# Patient Record
Sex: Male | Born: 1945 | Race: White | Hispanic: No | Marital: Married | Smoking: Current some day smoker
Health system: Southern US, Community
[De-identification: ages and names within clinical notes are randomized; demographics above are authoritative.]

## PROBLEM LIST (undated history)

## (undated) DIAGNOSIS — I10 Essential (primary) hypertension: Secondary | ICD-10-CM

## (undated) DIAGNOSIS — G4733 Obstructive sleep apnea (adult) (pediatric): Secondary | ICD-10-CM

## (undated) DIAGNOSIS — Z923 Personal history of irradiation: Secondary | ICD-10-CM

## (undated) DIAGNOSIS — F329 Major depressive disorder, single episode, unspecified: Secondary | ICD-10-CM

## (undated) DIAGNOSIS — Z9989 Dependence on other enabling machines and devices: Secondary | ICD-10-CM

## (undated) DIAGNOSIS — C801 Malignant (primary) neoplasm, unspecified: Secondary | ICD-10-CM

## (undated) DIAGNOSIS — Z8669 Personal history of other diseases of the nervous system and sense organs: Secondary | ICD-10-CM

## (undated) DIAGNOSIS — F32A Depression, unspecified: Secondary | ICD-10-CM

## (undated) DIAGNOSIS — E7800 Pure hypercholesterolemia, unspecified: Secondary | ICD-10-CM

## (undated) DIAGNOSIS — I82401 Acute embolism and thrombosis of unspecified deep veins of right lower extremity: Secondary | ICD-10-CM

## (undated) HISTORY — DX: Major depressive disorder, single episode, unspecified: F32.9

## (undated) HISTORY — DX: Depression, unspecified: F32.A

## (undated) HISTORY — DX: Malignant (primary) neoplasm, unspecified: C80.1

## (undated) HISTORY — DX: Essential (primary) hypertension: I10

## (undated) HISTORY — DX: Dependence on other enabling machines and devices: Z99.89

## (undated) HISTORY — DX: Personal history of other diseases of the nervous system and sense organs: Z86.69

## (undated) HISTORY — DX: Pure hypercholesterolemia, unspecified: E78.00

## (undated) HISTORY — DX: Acute embolism and thrombosis of unspecified deep veins of right lower extremity: I82.401

## (undated) HISTORY — DX: Obstructive sleep apnea (adult) (pediatric): G47.33

---

## 1998-09-07 ENCOUNTER — Ambulatory Visit (HOSPITAL_BASED_OUTPATIENT_CLINIC_OR_DEPARTMENT_OTHER): Admission: RE | Admit: 1998-09-07 | Discharge: 1998-09-07 | Payer: Self-pay | Admitting: Plastic Surgery

## 2000-03-01 ENCOUNTER — Other Ambulatory Visit: Admission: RE | Admit: 2000-03-01 | Discharge: 2000-03-01 | Payer: Self-pay | Admitting: Otolaryngology

## 2000-03-01 ENCOUNTER — Encounter (INDEPENDENT_AMBULATORY_CARE_PROVIDER_SITE_OTHER): Payer: Self-pay | Admitting: Specialist

## 2001-11-22 ENCOUNTER — Encounter: Admission: RE | Admit: 2001-11-22 | Discharge: 2001-11-22 | Payer: Self-pay | Admitting: Urology

## 2001-11-22 ENCOUNTER — Encounter: Payer: Self-pay | Admitting: Urology

## 2001-11-26 ENCOUNTER — Ambulatory Visit (HOSPITAL_BASED_OUTPATIENT_CLINIC_OR_DEPARTMENT_OTHER): Admission: RE | Admit: 2001-11-26 | Discharge: 2001-11-26 | Payer: Self-pay | Admitting: Urology

## 2001-11-29 ENCOUNTER — Encounter: Payer: Self-pay | Admitting: Urology

## 2001-11-29 ENCOUNTER — Ambulatory Visit (HOSPITAL_BASED_OUTPATIENT_CLINIC_OR_DEPARTMENT_OTHER): Admission: RE | Admit: 2001-11-29 | Discharge: 2001-11-29 | Payer: Self-pay | Admitting: Urology

## 2002-08-07 ENCOUNTER — Ambulatory Visit (HOSPITAL_BASED_OUTPATIENT_CLINIC_OR_DEPARTMENT_OTHER): Admission: RE | Admit: 2002-08-07 | Discharge: 2002-08-07 | Payer: Self-pay | Admitting: Plastic Surgery

## 2002-08-07 ENCOUNTER — Encounter (INDEPENDENT_AMBULATORY_CARE_PROVIDER_SITE_OTHER): Payer: Self-pay | Admitting: Specialist

## 2004-03-08 ENCOUNTER — Encounter (INDEPENDENT_AMBULATORY_CARE_PROVIDER_SITE_OTHER): Payer: Self-pay | Admitting: Plastic Surgery

## 2004-03-08 ENCOUNTER — Ambulatory Visit (HOSPITAL_BASED_OUTPATIENT_CLINIC_OR_DEPARTMENT_OTHER): Admission: RE | Admit: 2004-03-08 | Discharge: 2004-03-08 | Payer: Self-pay | Admitting: Plastic Surgery

## 2004-03-08 ENCOUNTER — Ambulatory Visit (HOSPITAL_COMMUNITY): Admission: RE | Admit: 2004-03-08 | Discharge: 2004-03-08 | Payer: Self-pay | Admitting: Plastic Surgery

## 2005-02-28 ENCOUNTER — Ambulatory Visit (HOSPITAL_COMMUNITY): Admission: RE | Admit: 2005-02-28 | Discharge: 2005-02-28 | Payer: Self-pay | Admitting: Gastroenterology

## 2007-05-17 HISTORY — PX: KIDNEY SURGERY: SHX687

## 2007-11-07 ENCOUNTER — Inpatient Hospital Stay (HOSPITAL_COMMUNITY): Admission: RE | Admit: 2007-11-07 | Discharge: 2007-11-09 | Payer: Self-pay | Admitting: Urology

## 2007-11-07 ENCOUNTER — Encounter: Payer: Self-pay | Admitting: Urology

## 2008-02-25 ENCOUNTER — Ambulatory Visit (HOSPITAL_COMMUNITY): Admission: RE | Admit: 2008-02-25 | Discharge: 2008-02-25 | Payer: Self-pay | Admitting: Urology

## 2008-05-29 ENCOUNTER — Ambulatory Visit (HOSPITAL_COMMUNITY): Admission: RE | Admit: 2008-05-29 | Discharge: 2008-05-29 | Payer: Self-pay | Admitting: Urology

## 2008-06-12 ENCOUNTER — Ambulatory Visit: Payer: Self-pay | Admitting: Hematology and Oncology

## 2008-09-02 ENCOUNTER — Ambulatory Visit (HOSPITAL_COMMUNITY): Admission: RE | Admit: 2008-09-02 | Discharge: 2008-09-02 | Payer: Self-pay | Admitting: Urology

## 2008-12-16 ENCOUNTER — Ambulatory Visit (HOSPITAL_COMMUNITY): Admission: RE | Admit: 2008-12-16 | Discharge: 2008-12-16 | Payer: Self-pay | Admitting: Urology

## 2009-04-17 ENCOUNTER — Ambulatory Visit (HOSPITAL_BASED_OUTPATIENT_CLINIC_OR_DEPARTMENT_OTHER): Admission: RE | Admit: 2009-04-17 | Discharge: 2009-04-18 | Payer: Self-pay | Admitting: Urology

## 2009-04-28 ENCOUNTER — Ambulatory Visit (HOSPITAL_COMMUNITY): Admission: RE | Admit: 2009-04-28 | Discharge: 2009-04-28 | Payer: Self-pay | Admitting: Urology

## 2009-05-16 HISTORY — PX: PENILE PROSTHESIS IMPLANT: SHX240

## 2009-09-17 ENCOUNTER — Ambulatory Visit (HOSPITAL_COMMUNITY): Admission: RE | Admit: 2009-09-17 | Discharge: 2009-09-17 | Payer: Self-pay | Admitting: Urology

## 2010-03-22 ENCOUNTER — Ambulatory Visit (HOSPITAL_COMMUNITY): Admission: RE | Admit: 2010-03-22 | Discharge: 2010-03-22 | Payer: Self-pay | Admitting: Urology

## 2010-08-17 LAB — POCT I-STAT 4, (NA,K, GLUC, HGB,HCT)
HCT: 40 % (ref 39.0–52.0)
Hemoglobin: 13.6 g/dL (ref 13.0–17.0)
Potassium: 4.5 mEq/L (ref 3.5–5.1)
Sodium: 141 mEq/L (ref 135–145)

## 2010-09-20 ENCOUNTER — Other Ambulatory Visit: Payer: Self-pay | Admitting: Urology

## 2010-09-20 ENCOUNTER — Ambulatory Visit (HOSPITAL_COMMUNITY)
Admission: RE | Admit: 2010-09-20 | Discharge: 2010-09-20 | Disposition: A | Discharge status: Home / Self Care | Payer: BC Managed Care – PPO | Source: Ambulatory Visit | Attending: Urology | Admitting: Urology

## 2010-09-20 DIAGNOSIS — C649 Malignant neoplasm of unspecified kidney, except renal pelvis: Secondary | ICD-10-CM | POA: Insufficient documentation

## 2010-09-20 DIAGNOSIS — J8410 Pulmonary fibrosis, unspecified: Secondary | ICD-10-CM | POA: Insufficient documentation

## 2010-09-20 DIAGNOSIS — F17200 Nicotine dependence, unspecified, uncomplicated: Secondary | ICD-10-CM | POA: Insufficient documentation

## 2010-09-28 NOTE — Op Note (Signed)
NAMEJESHAWN, Edward [name]           ACCOUNT NO.:  [id number]   MEDICAL RECORD NO.:  [id number]          PATIENT TYPE:  INP   LOCATION:  0008                         FACILITY:  [facility]   PHYSICIAN:  Bertram Millard. Dahlstedt, M.D.DATE OF BIRTH:  06-Jan-1946   DATE OF PROCEDURE:  11/07/2007  DATE OF DISCHARGE:                               OPERATIVE REPORT   PREOPERATIVE DIAGNOSIS:  Left renal cortical mass.   POSTOPERATIVE DIAGNOSIS:  Left renal cortical mass.   PROCEDURE PERFORMED:  Left laparoscopic radical nephrectomy.   SURGEON:  Bertram Millard. Dahlstedt, MD   ASSISTANT:  Melina Schools, MD   ANESTHESIA:  General.   ESTIMATED BLOOD LOSS:  75 mL.   DRAINS:  16-French Foley catheter.   INDICATIONS FOR PROCEDURE:  Mr. Dewan is a 65 year old male with  known nephrolithiasis.  He developed recurrent flank pain and hematuria.  He was imaged and found to have a centrally-located renal mass.  He  presents for surgical extirpation.   DESCRIPTION OF PROCEDURE:  The patient brought to the operating room.  He was identified by his armband.  Informed consent was verified and a  preoperative time-out was performed.  After the successful induction of  general endotracheal anesthesia the patient was moved to the right side  down flank position.  The table was flexed.  A Foley catheter was  inserted transurethrally into the bladder and the balloon inflated with  10 mL sterile water and placed to straight drain.  Operative site was  prepped and draped.  Surgeons were gowned and gloved.  Perioperative  antibiotics were administered and sequential compression devices were  employed.   An infraumbilical incision was made in the midline.  Bovie cautery was  used to carry this down to the aponeurosis of the external oblique,  which was incised sharply with Bovie cautery.  We identified the  posterior sheath along the arcuate line, which was also incised sharply.  We placed stay stitches in the  fascia.  We then punctured the peritoneal  layer and gained access to the peritoneal cavity.  We with digital  palpation confirmed that there were no adhesions.  We then placed a  Hasson port and secured it with our stay sutures.  We then performed  laparoscopy after insufflating the abdomen with carbon dioxide.   Assisting ports were placed under direct vision with the laparoscope  intraperitoneally.  We placed a 12-mm port in the right lower quadrant  and a 5-mm port in the midline superior to the umbilicus.  We then  identified the colon.  Using the Harmonic scalpel, we incised along the  white line of Toldt and reflected the colon medially.  This was done  from the splenic flexure down to the level of the iliac bifurcation.  We  then identified the plane between the mesocolon and the retroperitoneum  and developed this with a combination of blunt and sharp dissection.  This allowed [place] free exposure of the left retroperitoneum.  We  identified the left gonadal vein as well as the left ureter.  We were  able to bluntly dissect beneath that, identified the psoas muscle.  The  kidney was then tented upwards and we marched in a caudal to superior  fashion along this toward the hilum.  We further dropped the colon off  the kidney once it was tented up and this allowed [place] to expose the left  renal vein.  It was dissected and skeletonized.  We did identify a  rather large adrenal branch.  We then divided the gonadal vein after  securing it with clips to better our exposure.  We then continued the  posterior dissection until we identified the left renal artery.  This  again was skeletonized with a right angle.  It was clipped with two  clips on the downside and one clip on the specimen side.  We then left  it in situ without dividing it.  We did note that the vein went  relatively flat.  We then clipped the renal vein in similar fashion with  two clips on the down side, one on the specimen  side.  We additionally  doubly clipped the adrenal vein.  We then divided the left renal vein.  We then divided the left renal artery.  We then carried our dissection  along the medial border of the kidney superiorly until we had exposed  the adrenal.  At this time we completed the posterior and inferior  dissection.  We divided the ureter.  We used the LigaSure device to  divide the fatty attachments between Gerota's and the abdominal wall.  This completely dissected out the posterior lateral aspects of the  kidney as well as the inferior pole.  This left only the superior  portions.  We entered Gerota fascia superior to the renal hilum and  dropped the adrenal off so as to spare it.  We used a LigaSure device to  a drop that leaflet of Gerota fascia off of the superior pole of the  kidney.  Then divided the lienorenal ligament.  All the remaining  attachments were taken down bluntly.  At this time the kidney was freely  mobile.  We translocated away from the renal fossa and inspected.  The  excellent hemostasis was noted at the renal hilum.  There was a small  amount of bleeding coming from the splenic capsule and a piece of  Surgicel was placed.  We did not note any brisk bleeding with the  pneumoperitoneum there.  We then attempted several times to place the  very large specimen with a large amount of perinephric fat in an  EndoCatch bag.  We were unable to do so due to the size of the specimen.  We then looked all of our ports under direct vision.  We removed the  Hasson port and released the pneumoperitoneum.  We extended the midline  fasciotomy to a length of approximately 8 cm.  We were then able to  reach inside the peritoneal cavity and remove the kidney and perinephric  fat en bloc.  This was sent for permanent pathology.  The wounds were  irrigated.  The fascia of the 12-mm port was closed with a single 0  Vicryl interrupted.  The extraction site was closed with a running 0   Vicryl in standard fashion.  The skin was closed with surgical staples.  The patient tolerated the procedure well and there were no  complications.  Marcine Matar, MD, was the attending primary  responsible physician and was present and participated in all aspects of  the procedure.   DISPOSITION:  To the Post Anesthesia  Care Unit.      Melina Schools, MD      Bertram Millard. Dahlstedt, M.D.  Electronically Signed    JR/MEDQ  D:  11/07/2007  T:  11/07/2007  Job:  [id number]

## 2010-09-28 NOTE — H&P (Signed)
NAMEFRED, Edward [name]           ACCOUNT NO.:  [id number]   MEDICAL RECORD NO.:  [id number]          PATIENT TYPE:  INP   LOCATION:  0008                         FACILITY:  [facility]   PHYSICIAN:  Bertram Millard. Dahlstedt, M.D.DATE OF BIRTH:  04-Feb-1946   DATE OF ADMISSION:  11/07/2007  DATE OF DISCHARGE:                              HISTORY & PHYSICAL   REASON FOR ADMISSION:  Left renal mass.   BRIEF HISTORY:  [name] is a 65 year old male who presents at this time  for laparoscopic assisted left radical nephrectomy.  This was recently  diagnosed when he presented with flank pain and gross hematuria.  CT  abdomen and pelvis was performed at [facility] revealing a 6-  7 cm inferior pole mass in the left kidney.  Metastatic survey was  negative.  He has not had recurrent flank pain or gross hematuria.  He  presents for surgery, having been instructed in risks and complications  of the procedure.  He understands and desires to proceed.   PAST MEDICAL HISTORY:  Significant for  1. Sleep apnea.  2. Anxiety.  3. Depression.  4. Gout.  5. Hypercholesterolemia.  6. Hypertension.  7. Spinal stenosis.  8. Urolithiasis.   MEDICATIONS:  1. Allopurinol 100 mg daily.  2. Amlodipine 10 mg daily.  3. Benicar 40 mg daily.  4. Wellbutrin 300 mg daily.  5. Cialis 20 mg p.r.n.  6. Citalopram 40 mg daily.  7. Lasix 80 mg daily.  8. Klonopin.  9. Klor-Con 20 mEq daily.  10.Meloxicam 15 mg p.o.  11.Ambien 10 mg p.r.n.  12.Prevacid 30 mg daily.   ALLERGIES:  He denies drug allergies.   SOCIAL HISTORY:  The patient is married.  He has a grown son and  daughter and a 19 year old daughter.  He manages his family's finances  at home.  He does not have an active job away from home.  He does still  smoke.  He does not drink, having given this up secondary to alcoholism.   REVIEW OF SYSTEMS:  Significant for erectile dysfunction.  He has  depression.  Occasional back pain and leg  swelling.   PHYSICAL EXAMINATION:  GENERAL APPEARANCE:  A pleasant, middle-aged  male.  He had a somewhat flat affect.  NECK:  He had no subclavicular or cervical adenopathy.  LUNGS:  Clear.  HEART:  Normal rate and rhythm.  ABDOMEN:  Mildly protuberant, soft, nondistended, nontender.  No mass,  no megaly.  Bladder nonpalpable.  No CVA tenderness, no flank mass.  PROSTATE GLAND:  Unremarkable.  RECTAL:  No masses.  GU:  External genitalia were normal.  EXTREMITIES:  He had 1+ pretibial edema bilaterally.  DP and PTT pulses  were normal bilaterally.   ADMISSION LABORATORY DATA:  BMET was normal.  Hematocrit 37%, white  count 10,000, platelet count 378,000.   EKG:  Normal sinus rhythm.  He has been cleared by Dr. Kristeen Miss for  surgery.   Chest CT revealed findings consistent with granulomas, otherwise  unremarkable.   IMPRESSION:  1. Left renal mass.  This is most likely a renal cell carcinoma.  The  patient presents for nephrectomy today.  2. Depression.  3. Hypertension, controlled.  4. Hypercholesterolemia.   PLAN:  Admit for laparoscopic assisted left radical nephrectomy.      Bertram Millard. Dahlstedt, M.D.  Electronically Signed     SMD/MEDQ  D:  11/07/2007  T:  11/07/2007  Job:  [id number]   cc:   Vesta Mixer, M.D.  Fax: [phone]   C. Duane Lope, M.D.  Fax: [phone]

## 2010-10-01 NOTE — Op Note (Signed)
NAMEKIP, CROPP           ACCOUNT NO.:  [id number]   MEDICAL RECORD NO.:  [id number]          PATIENT TYPE:  AMB   LOCATION:  DSC                          FACILITY:  [facility]   PHYSICIAN:  Etter Sjogren, M.D.     DATE OF BIRTH:  [birth year]   DATE OF PROCEDURE:  03/08/2004  DATE OF DISCHARGE:                                 OPERATIVE REPORT   PREOPERATIVE DIAGNOSES:  1.  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  2.  Lesion of undetermined behavior, left cheek, greater than 0.5 cm.   POSTOPERATIVE DIAGNOSES:  1.  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  2.  Lesion of undetermined behavior, left cheek, greater than 0.5 cm.  3.  Complicated open wounds of the right and left cheek, total length of      greater than 2.5 cm.   PROCEDURE PERFORMED:  1.  Excision of basal cell carcinoma with resection greater than 1.0 cm.  2.  Excision, lesion of left cheek of undetermined behavior, greater than      0.5 cm.  3.  Complex wound closures, left and right cheek, total length greater than      2.5 cm.   SURGEON:  Etter Sjogren, M.D.   ANESTHESIA:  1% Xylocaine with epinephrine plus bicarb.   CLINICAL NOTE:  A 65 year old man has a basal cell carcinoma of his right  cheek, biopsy proven by his dermatologist. In addition, he has a lesion that  is bleeding on this left cheek. He would like to have that excised as well.  Procedure and risks were understood by him, including possibly a further  surgery depending upon the final pathology report. He wished to proceed.   PROCEDURE:  The patient was placed in a slightly head-elevated position. He  was marked for the resections with generous margins around the basal cell  carcinoma of the right cheek. He was prepped with Betadine and draped with  sterile drapes.  Satisfactory local anesthesia achieved, 1% Xylocaine  with  epinephrine plus bicarb. Left cheek lesion was addressed first with an  excision and thorough irrigation and layered closure with 4-0 Monocryl  interrupted deep sutures, 4-0 Monocryl interrupted deep dermal suture, and 6-  0 Prolene simple running suture.   Attention then directed to the right cheek where again satisfactory local  anesthesia achieved and the excision performed. Thorough irrigation and the  layered closure again with 4-0 Monocryl interrupted very deep sutures, 4-0  Monocryl interrupted very deep dermal sutures, and a running 6-0 Prolene  simple suture. Antibiotic ointment applied to both sites. Dry sterile  dressing. Tolerated well.   DISPOSITION:  Recheck in the office in a week.       DB/MEDQ  D:  03/08/2004  T:  03/08/2004  Job:  [id number]   cc:   Rocco Serene, M.D.  [address] [place]  [place] [address]  Fax: [phone]

## 2010-10-01 NOTE — Op Note (Signed)
   NAME:  Edward [name], Edward [name]                     ACCOUNT NO.:  [id number]   MEDICAL RECORD NO.:  [id number]                   PATIENT TYPE:  AMB   LOCATION:  DSC                                  FACILITY:  [facility]   PHYSICIAN:  Etter Sjogren, M.D.                  DATE OF BIRTH:  31-Oct-1945   DATE OF PROCEDURE:  08/07/2002  DATE OF DISCHARGE:                                 OPERATIVE REPORT   PREOPERATIVE DIAGNOSIS:  Basal cell carcinoma 1 cm cheek with irregular  borders.   POSTOPERATIVE DIAGNOSIS:  1. Basal cell carcinoma 1 cm cheek with irregular borders.  2. Complicated open wound of the cheek greater than 2.5 cm secondary to a     cancer excision with irregular borders.   OPERATION PERFORMED:  1. Basal cell carcinoma excision 1.0 cm, cheek.  2. Complex wound closure of the cheek, greater than 2.5 cm.   SURGEON:  Etter Sjogren, M.D.   ANESTHESIA:  1% Xylocaine with epinephrine plus bicarb.   INDICATIONS FOR PROCEDURE:  The patient is a 65 year old man who has a  history of previous skin cancers.  He has another one now, basal cell  carcinoma of the left cheek that has been biopsy proven by his  dermatologist.  He presents for a wider excision of this.  The nature of the  procedure, the risks and possible complications were discussed with him in  detail.  He understood these risks including the possibility of further  surgery depending on the final pathology report and wished to proceed.   DESCRIPTION OF PROCEDURE:  The elliptical excision was marked taking a  border of what appeared to be grossly normal tissue.  He was then placed  supine, prepped with Betadine and draped with sterile drapes.  Satisfactory  local anesthesia was achieved and the excision was performed.  The specimen  tagged at its medial border with a silk stitch.  The wound was irrigated  thoroughly.  Good hemostasis was confirmed.  The wound edges were  undermined.  A thorough irrigation again.  A layered  closure with 5-0  Monocryl inverted interrupted deep sutures, 5-0 Monocryl interrupted  inverted subcutaneous and deep dermal sutures and a running 6-0 Prolene  suture, a few 6-0 Prolene simple sutures and one 6-0 Prolene horizontal  mattress suture for wound edge eversion.  Antibiotic ointment was applied.  He tolerated the procedure well.   DISPOSITION:  Recheck in six days in the office.  No lifting or vigorous  activities for two days.                                               Etter Sjogren, M.D.     DB/MEDQ  D:  08/07/2002  T:  08/08/2002  Job:  [id number]

## 2010-10-01 NOTE — Discharge Summary (Signed)
Edward [name], Edward [name]           ACCOUNT NO.:  [id number]   MEDICAL RECORD NO.:  [id number]          PATIENT TYPE:  INP   LOCATION:  1414                         FACILITY:  [facility]   PHYSICIAN:  Bertram Millard. Dahlstedt, M.D.DATE OF BIRTH:  1946-01-12   DATE OF ADMISSION:  11/07/2007  DATE OF DISCHARGE:  11/09/2007                               DISCHARGE SUMMARY   ADMITTING DIAGNOSIS:  Left renal mass.   DISCHARGE DIAGNOSIS:  Left renal mass.   PROCEDURE PERFORMED:  Left laparoscopic radical nephrectomy.   HISTORY OF PRESENT ILLNESS:  Edward [name] is a 65 year old male with a  history of nephrolithiasis.  He presented with flank pain and hematuria.  He underwent a CT scan which showed a 7 cm inferior pole mass in the  left kidney.  His metastatic survey was negative.  He presents for  surgical extrication.   HOSPITAL COURSE:  The patient was admitted to the hospital on the day of  surgery.  He was taken to the operating room where he underwent the  above-mentioned procedure under general anesthetic.  The patient  tolerated the procedure well and there were no complications.  He was  admitted to the floor from the post anesthesia care unit.  He given a  clear liquid diet on postoperative day 1, and his Foley catheter was  removed.  On postoperative day 2, the patient was ambulatory, tolerating  a diet.  His pain was controlled with p.o. pain medicine.  His port  sites were clean, dry, intact, and he was voiding without the use of  Foley catheter.  At this time it was felt the patient was stable for  discharge to home.   DISCHARGE MEDICATIONS:  1. Resume home medications.  2. Vicodin.   DISCHARGE INSTRUCTIONS:  The patient was told to resume his previous  activity.  He is up as tolerated.  He is to resume his previous diet.  He is instructed to call [phone] for any concerning symptoms including,  but not limited to the following:  fever, chills, controlled pain, wound  redness  or drainage, or any concerns.  The patient indicated he  understood those instructions and was amenable to discharge to home.   DISPOSITION:  To home.     Melina Schools, MD      Bertram Millard. Dahlstedt, M.D.  Electronically Signed   JR/MEDQ  D:  11/13/2007  T:  11/13/2007  Job:  [id number]

## 2010-10-01 NOTE — Op Note (Signed)
[facility]  Patient:    Edward [name], BEEHLER Visit Number: [id number] MRN: [id number]          Service Type: NES Location: [facility]ESC Attending Physician:  Liborio Nixon Dictated by:   Bertram Millard. Dahlstedt, M.D. Proc. Date: 11/26/01 Admit Date:  11/26/2001   CC:         Vikki Ports, M.D.   Operative Report  PREOPERATIVE DIAGNOSIS:  Right renal calculus.  POSTOPERATIVE DIAGNOSIS:  Right renal calculus.  PRINCIPAL PROCEDURE:  Cystoscopy, right retrograde ureteral pyelogram, double-J stent placement.  SURGEON:  Bertram Millard. Dahlstedt, M.D.  ANESTHESIA:  General with LMA.  COMPLICATIONS:  None.  BRIEF HISTORY:  A 65 year old gentleman with a longstanding right flank pain. I had seen him initially back in 2000 for an elevated PSA.  Over the past six months or so he has had intermittent right flank pain and intermittent gross hematuria.  He was diagnosed with a 12 x 12 mm right renal calculus.  This has continued to cause the patient symptoms.  At the present time the patient presents for cystoscopy, retrograde pyelogram and double-J stent placement.  This will be followed in three days by a lithotripsy.  Other options of stone management have been discussed with the patient, including in situ lithotripsy versus ureteroscopy.  He has chosen to have the less invasive treatment.  He is aware of the risks and complications and desires to proceed.  DESCRIPTION OF PROCEDURE:  The patient was administered a general anesthetic and placed in the dorsal lithotomy position.  Genitalia and perineum were prepped and draped.  A 22-French panendoscope was advanced into his bladder; which was unremarkable.  The right ureter was cannulated and a retrograde was performed, showing only minor filling defect in the pelvis of the right kidney.  No ureteral obstruction or filling defects were noted.   A 6-French x 26 cm double-J stent was placed over a  guide wire.  We were using fluoroscopic and cystoscopic guidance.  The patient tolerated the procedure well.  His bladder was drained and the scope removed.  A B&O suppository was placed transrectally.  The patient will follow up in three days with lithotripsy of the right renal calculus.   Dictated by:   Bertram Millard. Dahlstedt, M.D. Attending Physician:  Liborio Nixon DD:  11/26/01 TD:  11/27/01 Job: [id number]

## 2010-10-01 NOTE — Op Note (Signed)
Edward [name], Edward [name]           ACCOUNT NO.:  [id number]   MEDICAL RECORD NO.:  [id number]          PATIENT TYPE:  AMB   LOCATION:  ENDO                         [facility]ACILITY[facility]   PHYSICIAN:  Shirley Friar, MDDATE OF BIRTH:  06-26-1945   DATE OF PROCEDURE:  02/28/2005  DATE OF DISCHARGE:                                 OPERATIVE REPORT   PROCEDURE:  Colonoscopy.   INDICATIONS FOR PROCEDURE:  Screening.   ANESTHESIA:  Demerol 60 mg IV, Versed 6 mg IV.   FINDINGS:  Rectal exam was normal.  A standard adult colonoscope was  inserted and advanced through a well-prepped colon to the cecum where the  ileocecal valve and appendiceal orifice were identified.  The terminal ileum  was intubated and was normal in appearance.  Careful withdrawal of the  colonoscope revealed normal mucosa.  No masses and no polyps were noted.  Retroflexion revealed small internal hemorrhoids.  The colonoscope was  withdrawn, comparing the above findings.   ASSESSMENT:  Small internal hemorrhoids.  Otherwise normal colonoscopy.   PLAN:  1.  Repeat colonoscopy in 10 years for screening.  2.  Increase fiber in diet.  3.  Follow up in GI as needed.      Shirley Friar, MD  Electronically Signed     VCS/MEDQ  D:  02/28/2005  T:  02/28/2005  Job:  [id number]   cc:   Dellis Anes. Idell Pickles, M.D.  Fax: [phone]

## 2011-02-10 LAB — DIFFERENTIAL
Basophils Absolute: 0
Basophils Relative: 0
Eosinophils Absolute: 0
Eosinophils Relative: 0
Monocytes Absolute: 1.6 — ABNORMAL HIGH
Monocytes Relative: 11

## 2011-02-10 LAB — BASIC METABOLIC PANEL
BUN: 13
CO2: 27
Calcium: 8.4
Chloride: 102
Chloride: 106
Creatinine, Ser: 1.34
GFR calc Af Amer: 48 — ABNORMAL LOW
GFR calc non Af Amer: 55 — ABNORMAL LOW
Glucose, Bld: 142 — ABNORMAL HIGH
Glucose, Bld: 98
Sodium: 137

## 2011-02-10 LAB — CBC
HCT: 33.2 — ABNORMAL LOW
HCT: 37.4 — ABNORMAL LOW
Hemoglobin: 11 — ABNORMAL LOW
MCHC: 33
MCV: 82.4
MCV: 82.7
Platelets: 378
RBC: 4.02 — ABNORMAL LOW
RDW: 18.2 — ABNORMAL HIGH
RDW: 18.3 — ABNORMAL HIGH
WBC: 10.5

## 2011-02-10 LAB — ABO/RH: ABO/RH(D): A POS

## 2011-02-10 LAB — TYPE AND SCREEN: ABO/RH(D): A POS

## 2011-09-23 ENCOUNTER — Other Ambulatory Visit: Payer: Self-pay | Admitting: Urology

## 2011-09-23 ENCOUNTER — Ambulatory Visit (HOSPITAL_COMMUNITY)
Admission: RE | Admit: 2011-09-23 | Discharge: 2011-09-23 | Disposition: A | Discharge status: Home / Self Care | Payer: BC Managed Care – PPO | Source: Ambulatory Visit | Attending: Urology | Admitting: Urology

## 2011-09-23 DIAGNOSIS — Z125 Encounter for screening for malignant neoplasm of prostate: Secondary | ICD-10-CM

## 2011-09-23 DIAGNOSIS — K449 Diaphragmatic hernia without obstruction or gangrene: Secondary | ICD-10-CM | POA: Insufficient documentation

## 2011-09-23 DIAGNOSIS — Z8546 Personal history of malignant neoplasm of prostate: Secondary | ICD-10-CM | POA: Insufficient documentation

## 2012-10-12 ENCOUNTER — Other Ambulatory Visit: Payer: Self-pay | Admitting: Urology

## 2012-10-12 ENCOUNTER — Ambulatory Visit (HOSPITAL_COMMUNITY)
Admission: RE | Admit: 2012-10-12 | Discharge: 2012-10-12 | Disposition: A | Discharge status: Home / Self Care | Payer: BC Managed Care – PPO | Source: Ambulatory Visit | Attending: Urology | Admitting: Urology

## 2012-10-12 DIAGNOSIS — I10 Essential (primary) hypertension: Secondary | ICD-10-CM | POA: Insufficient documentation

## 2012-10-12 DIAGNOSIS — C649 Malignant neoplasm of unspecified kidney, except renal pelvis: Secondary | ICD-10-CM

## 2012-10-12 DIAGNOSIS — F17200 Nicotine dependence, unspecified, uncomplicated: Secondary | ICD-10-CM | POA: Insufficient documentation

## 2013-01-10 ENCOUNTER — Encounter: Payer: Self-pay | Admitting: Neurology

## 2013-01-10 ENCOUNTER — Ambulatory Visit (INDEPENDENT_AMBULATORY_CARE_PROVIDER_SITE_OTHER): Payer: BC Managed Care – PPO | Admitting: Neurology

## 2013-01-10 VITALS — BP 131/78 | HR 62 | Temp 98.2°F | Ht 72.5 in | Wt 221.0 lb

## 2013-01-10 DIAGNOSIS — F17200 Nicotine dependence, unspecified, uncomplicated: Secondary | ICD-10-CM

## 2013-01-10 DIAGNOSIS — R400 Somnolence: Secondary | ICD-10-CM

## 2013-01-10 DIAGNOSIS — H532 Diplopia: Secondary | ICD-10-CM

## 2013-01-10 DIAGNOSIS — G4710 Hypersomnia, unspecified: Secondary | ICD-10-CM

## 2013-01-10 DIAGNOSIS — Z8669 Personal history of other diseases of the nervous system and sense organs: Secondary | ICD-10-CM

## 2013-01-10 DIAGNOSIS — R9402 Abnormal brain scan: Secondary | ICD-10-CM

## 2013-01-10 DIAGNOSIS — R9409 Abnormal results of other function studies of central nervous system: Secondary | ICD-10-CM

## 2013-01-10 DIAGNOSIS — G4733 Obstructive sleep apnea (adult) (pediatric): Secondary | ICD-10-CM

## 2013-01-10 DIAGNOSIS — G5139 Clonic hemifacial spasm, unspecified: Secondary | ICD-10-CM

## 2013-01-10 DIAGNOSIS — R51 Headache: Secondary | ICD-10-CM

## 2013-01-10 DIAGNOSIS — G518 Other disorders of facial nerve: Secondary | ICD-10-CM

## 2013-01-10 HISTORY — DX: Personal history of other diseases of the nervous system and sense organs: Z86.69

## 2013-01-10 NOTE — Progress Notes (Signed)
Subjective:    Patient ID: Edward [name] is a 67 y.o. male.  HPI Edward Foley, MD, PhD [facility] [address], [address] [address] [place], [place] [address]   Dear Dr. Tenny Craw,   I saw your patient, Edward [name], upon your kind request in my neurologic clinic today for initial consultation of his diplopia. The patient is unaccompanied today. As you know, Edward [name] is a 67 year old right-handed gentleman with an underlying medical history of renal cancer, status post nephrectomy, OSA, not on CPAP due to "inconvenience", chronic kidney disease stage III, vitamin D deficiency, cervical spinal stenosis, hyperlipidemia, depression, insomnia, reflux disease, gout, history of Bell's palsy, erectile dysfunction, hypertension and smoking who has had 2 recent short episodes of sudden onset of double vision. This occurred about a week ago while driving and he seemed to have 2 superimposed images vertically. The second episode was about 2 days later. Those episodes lasted less than a minute and he did not have any one-sided weakness, numbness, droopy face, slurring of speech or confusion or decrease in consciousness level. He does have a history of headaches and reports more headaches recently. He endorses some daytime tiredness. He was started by you on Plavix on 01/08/2013 and has also been referred to ophthalmology, and saw the eye doctor yesterday and was told everything was.  He had no HA at the time of his diplopia, no nausea, no SOB/CP. He has residual R facial weakness from Bell's palsy in '98, but otherwise had noted no weakness, no numbness, no tingling. He feels at baseline currently but does endorse recent increase in stressors and in anxiety and has been smoking more cigarettes. He has a remote history of alcoholism and has not had any alcohol in the past 24 years or so. The headaches he is describing are intermittent, non-throbbing, achy, constant, bifrontal, not  associated with nausea, vomiting, photophobia or sonophobia. Tylenol helps. Occasionally he wakes up with a headache. He endorses snoring and daytime tiredness. He endorses lack of energy.  His Past Medical History Is Significant For: Past Medical History  Diagnosis Date  . HTN (hypertension)   . Hypercholesteremia   . Depression   . Cancer     Renal   His Past Surgical History Is Significant For: Past Surgical History  Procedure Laterality Date  . Kidney surgery  2009  . Penile prosthesis implant  2011   His Family History Is Significant For: Family History  Problem Relation Age of Onset  . Cancer Mother   . Heart failure Father   Heart attack.  His Social History Is Significant For: History   Social History  . Marital Status: Married    Spouse Name: N/A    Number of Children: N/A  . Years of Education: N/A   Social History Main Topics  . Smoking status: Current Every Day Smoker -- 1.00 packs/day    Types: Cigarettes  . Smokeless tobacco: None  . Alcohol Use: No  . Drug Use: No  . Sexual Activity: None   Other Topics Concern  . None   Social History Narrative  . None    His Allergies Are:  Allergies  Allergen Reactions  . Nsaids   :   His Current Medications Are:  Outpatient Encounter Prescriptions as of 01/10/2013  Medication Sig Dispense Refill  . allopurinol (ZYLOPRIM) 100 MG tablet       . buPROPion (WELLBUTRIN XL) 150 MG 24 hr tablet       .  citalopram (CELEXA) 40 MG tablet       . clonazePAM (KLONOPIN) 0.5 MG tablet       . clopidogrel (PLAVIX) 75 MG tablet       . furosemide (LASIX) 80 MG tablet       . KLOR-CON M20 20 MEQ tablet       . LIPITOR 40 MG tablet       . metoprolol succinate (TOPROL-XL) 100 MG 24 hr tablet       . omeprazole (PRILOSEC) 40 MG capsule       . ramipril (ALTACE) 5 MG capsule       . zolpidem (AMBIEN) 10 MG tablet        No facility-administered encounter medications on file as of 01/10/2013.   Review of Systems   Constitutional: Positive for fatigue.  Eyes: Positive for visual disturbance (double vision).    Objective:  Neurologic Exam  Physical Exam Physical Examination:   Filed Vitals:   01/10/13 0824  BP: 131/78  Pulse: 62  Temp: 98.2 F (36.8 C)    General Examination: The patient is a very pleasant 67 y.o. male in no acute distress. He appears well-developed and well-nourished and well groomed.   HEENT: Normocephalic, atraumatic, pupils are equal, round and reactive to light and accommodation. Funduscopic exam is normal with sharp disc margins noted. Extraocular tracking is good without limitation to gaze excursion or nystagmus noted. Normal smooth pursuit is noted. Hearing is grossly intact. Tympanic membranes are clear bilaterally. Face is asymmetric with normal facial animation with R LMN type weakness and L hemifacial spasm, and normal facial sensation. Speech is clear with no dysarthria noted. There is no hypophonia. There is no lip, neck/head, jaw or voice tremor. Neck is supple with full range of passive and active motion. There are no carotid bruits on auscultation. Oropharynx exam reveals: mild mouth dryness, adequate dental hygiene and mild airway crowding, due to redundant soft palate. Uvula is absent, he is status post uvulectomy. Tonsils are 1+.  Chest: Clear to auscultation without wheezing, rhonchi or crackles noted.  Heart: S1+S2+0, regular and normal without murmurs, rubs or gallops noted.   Abdomen: Soft, non-tender and non-distended with normal bowel sounds appreciated on auscultation.  Extremities: There is 1+ pitting edema in the distal lower extremities bilaterally, left more than right. Pedal pulses are intact.  Skin: Warm and dry without trophic changes noted. There are no varicose veins.  Musculoskeletal: exam reveals no obvious joint deformities, tenderness or joint swelling or erythema.   Neurologically:  Mental status: The patient is awake, alert and  oriented in all 4 spheres. His memory, attention, language and knowledge are appropriate. There is no aphasia, agnosia, apraxia or anomia. Speech is clear with normal prosody and enunciation. Thought process is linear. Mood is congruent and affect is normal.  Cranial nerves are as described above under HEENT exam. In addition, shoulder shrug is normal with equal shoulder height noted. Motor exam: Normal bulk, strength and tone is noted. There is no drift, tremor or rebound. Romberg is negative. Reflexes are 2+ throughout. Toes are downgoing bilaterally. Fine motor skills are intact with normal finger taps, normal hand movements, normal rapid alternating patting, normal foot taps and normal foot agility.  Cerebellar testing shows no dysmetria or intention tremor on finger to nose testing. Heel to shin is unremarkable bilaterally. There is no truncal or gait ataxia.  Sensory exam is intact to light touch, pinprick, vibration, temperature sense and proprioception in the upper and  lower extremities.  Gait, station and balance are unremarkable. No veering to one side is noted. No leaning to one side is noted. Posture is age-appropriate and stance is narrow based. No problems turning are noted. He turns en bloc. Tandem walk is unremarkable. Intact toe and heel stance is noted.               Assessment and Plan:   Assessment and Plan:  In summary, Edward [name] is a very pleasant 67 y.o.-year old male with a history of multiple vascular risk factors including age, gender, hyperlipidemia, hypertension, family history of heart attack, obstructive sleep apnea and current smoking as well as obesity, who presents with 2 brief episodes of diplopia. The presentation of his diplopia is so brief and was different both times that I believe is less likely that he had a TIA. Nevertheless he does report a prior history of abnormality on his brain scan and was told at one time in 1998 that he may have a brain aneurysm.  Given his overall risk factor profile, he is recently worsening headaches, and his prior history of abnormality on brain scan, would like to proceed with a more complete TIA workup. I've asked him to continue with Plavix as he is at risk for TIA or stroke.. {Desc; his/. I have encouraged him to pursue workup and treatment for obstructive sleep apnea again. We talked about vascular disease as a consequence of untreated OSA. He would be willing to try CPAP if the need arises. To that end, I will order a brain MRI with and without contrast, neck and head MRA, sleep study. He recently had blood work in your office and I will refrain from adding more blood work at this time he also had blood work with his nephrologist recently. I would like to see him back after these tests are completed. I will also order echocardiogram. I explained to him that diplopia in isolation can have many different etiologies. His exam is thankfully nonfocal with the exception of residual right facial weakness. He also has evidence of right hemifacial spasm. This does not bother him.  I answered all his questions today and the patient was in agreement with the above outlined plan. I encouraged him to call with any interim questions, concerns, problems or updates and test results.   Thank you very much for allowing me to participate in the care of this nice patient. If I can be of any further assistance to you please do not hesitate to call me at [phone].  Sincerely,   Edward Foley, MD, PhD

## 2013-01-10 NOTE — Patient Instructions (Signed)
Based on your symptoms and your exam I believe you are at risk for obstructive sleep apnea or OSA, and I think we should proceed with a sleep study to determine whether you do or do not have OSA and how severe it is. If you have more than mild OSA, I want you to consider treatment with CPAP. Please remember, the risks and ramifications of moderate to severe obstructive sleep apnea or OSA are: Cardiovascular disease, including congestive heart failure, stroke, difficult to control hypertension, arrhythmias, and even type 2 diabetes has been linked to untreated OSA. Sleep apnea causes disruption of sleep and sleep deprivation in most cases, which, in turn, can cause recurrent headaches, problems with memory, mood, concentration, focus, and vigilance. Most people with untreated sleep apnea report excessive daytime sleepiness, which can affect their ability to drive. Please do not drive if you feel sleepy.  I will see you back after your sleep study to go over the test results and where to go from there. We will call you after your sleep study and to set up an appointment at the time.   Continue your medications as directed. You have multiple risk factors for TIA or stroke. Risk factor reduction includes: smoking cessation, taking care of blood sugar values or diabetes management, good blood pressure (hypertension) control and optimizing cholesterol management, exercising daily or regularly within your own mobility limitations of course and screening for and treatment of obstructive sleep apnea (OSA).

## 2013-01-23 ENCOUNTER — Ambulatory Visit (INDEPENDENT_AMBULATORY_CARE_PROVIDER_SITE_OTHER): Payer: BC Managed Care – PPO | Admitting: Neurology

## 2013-01-23 DIAGNOSIS — G4761 Periodic limb movement disorder: Secondary | ICD-10-CM

## 2013-01-23 DIAGNOSIS — R9431 Abnormal electrocardiogram [ECG] [EKG]: Secondary | ICD-10-CM

## 2013-01-23 DIAGNOSIS — R400 Somnolence: Secondary | ICD-10-CM

## 2013-01-23 DIAGNOSIS — G4733 Obstructive sleep apnea (adult) (pediatric): Secondary | ICD-10-CM

## 2013-01-30 ENCOUNTER — Ambulatory Visit (INDEPENDENT_AMBULATORY_CARE_PROVIDER_SITE_OTHER): Payer: BC Managed Care – PPO

## 2013-01-30 ENCOUNTER — Other Ambulatory Visit: Payer: Self-pay | Admitting: Neurology

## 2013-01-30 DIAGNOSIS — R9402 Abnormal brain scan: Secondary | ICD-10-CM

## 2013-01-30 DIAGNOSIS — R9409 Abnormal results of other function studies of central nervous system: Secondary | ICD-10-CM

## 2013-01-30 DIAGNOSIS — F17200 Nicotine dependence, unspecified, uncomplicated: Secondary | ICD-10-CM

## 2013-01-30 DIAGNOSIS — R51 Headache: Secondary | ICD-10-CM

## 2013-01-30 DIAGNOSIS — G4710 Hypersomnia, unspecified: Secondary | ICD-10-CM

## 2013-01-30 DIAGNOSIS — G4733 Obstructive sleep apnea (adult) (pediatric): Secondary | ICD-10-CM

## 2013-01-30 DIAGNOSIS — R400 Somnolence: Secondary | ICD-10-CM

## 2013-01-30 DIAGNOSIS — R519 Headache, unspecified: Secondary | ICD-10-CM

## 2013-01-30 DIAGNOSIS — H532 Diplopia: Secondary | ICD-10-CM

## 2013-02-01 ENCOUNTER — Telehealth: Payer: Self-pay | Admitting: Neurology

## 2013-02-01 DIAGNOSIS — G4733 Obstructive sleep apnea (adult) (pediatric): Secondary | ICD-10-CM

## 2013-02-01 NOTE — Telephone Encounter (Signed)
Please call and notify patient that the recent sleep study confirmed the diagnosis of OSA. He did well with CPAP during the study with significant improvement of the respiratory events. Therefore, I would like start the patient on CPAP at home. I placed the order in the chart.   Arrange for CPAP set up at home through a DME company of patient's choice and fax/route report to PCP and referring MD (if other than PCP).   The patient will also need a follow up appointment with me in 6-8 weeks post set up that has to be scheduled; help the patient schedule this (in a follow-up slot).   Please re-enforce the importance of compliance with treatment and the need for us to monitor compliance data.   Once you have spoken to the patient and scheduled the return appointment, you may close this encounter, thanks,   Cashlynn Yearwood, MD, PhD [facility]    

## 2013-02-01 NOTE — Progress Notes (Signed)
Quick Note:  His MR angiogram neck was normal and the head MR angiogram showed mild overall atherosclerotic changes. These were reported as mildly abnormal and there was no focal blood vessel stenosis (tightness) or occlusion. Huston Foley, MD, PhD [facility]   ______

## 2013-02-01 NOTE — Progress Notes (Signed)
Quick Note:  Please call patient regarding the recent brain MRI: The brain scan showed a normal structure of the brain and no significant volume loss which we call atrophy. There were changes in the deeper structures of the brain, which we call white matter changes or microvascular changes. These are tiny white spots, that occur with time and are seen in a variety of conditions, including with normal aging, chronic hypertension, chronic headaches, especially migraine HAs, chronic diabetes, chronic hyperlipidemia. These are not strokes and no mass or lesion or contrast enhancement was seen which is reassuring.  Again, there were no acute findings, such as a stroke, or mass or blood products. Since there was a single larger focus of white matter change in the front on the R side of the brain, I would like to repeat his brain MRI with CONTRAST to further delineate that area. There is nothing to worry about, I would like to get a more complete evaluation - this time with IV contrast.  I will go ahead and order a repeat brain MRI with contrast.  I would like to re-enforce the importance of good blood pressure control, good cholesterol control, good blood sugar control, and weight management. Please remind patient to keep any upcoming appointments or tests and to call us with any interim questions, concerns, problems or updates. Thanks,  Huston Foley, MD, PhD    ______

## 2013-02-04 ENCOUNTER — Telehealth: Payer: Self-pay | Admitting: Neurology

## 2013-02-04 NOTE — Telephone Encounter (Signed)
Spoke with pt concerning his MRI test that he had done. Spoke with Dr. Frances Furbish about this pt, reason why pt did not have MRI w/ contrast and the reason was that his creatinine was high and the tech could not do the MRI w/ contrast. Called pt and the pt stated that his kidney doctor's name was Dr. Terrial Rhodes at [phone]. Called doctor's office and left message about the pt and for them to return my call. Dr. Frances Furbish wanted to know if pt has any upcoming appts. And if it was okay to have the MRI w/ contrast done.

## 2013-02-04 NOTE — Progress Notes (Signed)
Quick Note:  Called pt about his results of his MRA Angiogram of head and neck. Pt verbalized understanding. ______

## 2013-02-06 NOTE — Telephone Encounter (Signed)
Called to discuss results with patient, he asked me to call back in 1 hour.  I will call again at 4:15 PM

## 2013-02-08 ENCOUNTER — Encounter: Payer: Self-pay | Admitting: *Deleted

## 2013-02-08 ENCOUNTER — Telehealth: Payer: Self-pay | Admitting: *Deleted

## 2013-02-08 NOTE — Telephone Encounter (Signed)
Pt says that he was supposed to get a call back regarding sleep study results but has not heard anything yet.

## 2013-02-08 NOTE — Telephone Encounter (Signed)
Called patient to discuss sleep study results.  Discussed findings, recommendations and follow up care.  Patient understood well and all questions were answered.   Pt requested orders be sent to [facility] as he has a previous relationship with them. I will be sure and forward orders today.  A follow up appointment was scheduled with Dr. Frances Furbish for 04/08/2013 at 10:30 AM.  A copy of the sleep study interpretive report as well as a letter with info regarding contact info for the DME company, the importance of CPAP compliance, and the date of the follow up appointment info will be mailed to the patient's home.  Patient had a question about scheduling an MRI with contrast while I had him on the line.  A staff message was forwarded to [facility] for her response. -sh

## 2013-02-08 NOTE — Telephone Encounter (Signed)
Please call Corrie Dandy at [facility] about Pt  [phone]

## 2013-02-12 NOTE — Telephone Encounter (Signed)
I LM or Corrie Dandy [phone] , that wanted to relate the message about pt having elevated creatinine test and next appt with Dr Arrie Aran and if of to have MRI w/ contrast.

## 2013-02-14 NOTE — Telephone Encounter (Signed)
I will hold off on any imaging studies involving contrast until his followup appointment. His creatinine level is too high.

## 2013-02-14 NOTE — Telephone Encounter (Signed)
I called and LM with Mary at x [phone] relating to pts labs creatinine 1.9 and gfr 36 on 01-30-13 ([facility]).  Had to defer MRI brain with contrast.  ? appt or reccs?   Thanks.

## 2013-02-18 NOTE — Telephone Encounter (Signed)
I called and spoke to [place], from [facility].  I gave her the results, per below per [facility], done here on site. She would get with Dr. Arrie Aran, and then call us back.

## 2013-02-21 NOTE — Telephone Encounter (Signed)
Mary called back and relayed that Dr. Arrie Aran from [facility] does not want the pt to have MRI with contrast due to elevated creatinine.  They will be checking pt. FYI

## 2013-02-28 ENCOUNTER — Other Ambulatory Visit: Payer: BC Managed Care – PPO

## 2013-03-28 ENCOUNTER — Encounter: Payer: Self-pay | Admitting: Neurology

## 2013-03-28 DIAGNOSIS — G4733 Obstructive sleep apnea (adult) (pediatric): Secondary | ICD-10-CM

## 2013-03-29 NOTE — Progress Notes (Signed)
Quick Note:  I reviewed the patient's CPAP compliance data from 02/18/2013 to 03/19/2013, which is a total of 30 days, during which time the patient used CPAP every day The average usage for all days was 5 hours and 17 minutes. The percent used days greater than 4 hours was 80 %, indicating good compliance. The residual AHI was 8.8 per hour, indicating a somewhat suboptimal treatment pressure of 6 cwp with EPR of 1. I will review this data with the patient at the next office visit, provide feedback and additional troubleshooting if need be. In the interim I would like to increase his pressure to 7 cm of water and we will fax disorder to his existing DME company, [facility].  Huston Foley, MD, PhD [facility]   ______

## 2013-04-03 ENCOUNTER — Encounter: Payer: Self-pay | Admitting: Neurology

## 2013-04-08 ENCOUNTER — Ambulatory Visit (INDEPENDENT_AMBULATORY_CARE_PROVIDER_SITE_OTHER): Payer: BC Managed Care – PPO | Admitting: Neurology

## 2013-04-08 ENCOUNTER — Encounter: Payer: Self-pay | Admitting: Neurology

## 2013-04-08 VITALS — BP 112/72 | HR 66 | Temp 98.0°F | Ht 72.5 in | Wt 225.0 lb

## 2013-04-08 DIAGNOSIS — G4733 Obstructive sleep apnea (adult) (pediatric): Secondary | ICD-10-CM

## 2013-04-08 DIAGNOSIS — Z8669 Personal history of other diseases of the nervous system and sense organs: Secondary | ICD-10-CM

## 2013-04-08 HISTORY — DX: Obstructive sleep apnea (adult) (pediatric): G47.33

## 2013-04-08 NOTE — Progress Notes (Signed)
Subjective:    Patient ID: Edward [name] is a 67 y.o. male.  HPI    Interim history:   Edward [name] is a 67 year old right-handed gentleman with an underlying medical history of renal cancer, status post nephrectomy, chronic kidney disease stage III, vitamin D deficiency, cervical spinal stenosis, hyperlipidemia, depression, insomnia, reflux disease, gout, history of Bell's palsy, erectile dysfunction, hypertension and smoking who presents for followup consultation of his recent episodes of double vision and his obstructive sleep apnea. He is unaccompanied today. I first met him on 01/10/2013 at which time he reported that he was diagnosed with obstructive sleep apnea in the past but did not use CPAP because of inconvenience more or less. I asked him to have a brain MRI and brain MRA as well as a sleep study. His brain MRI showed white matter changes and one larger focus of white matter change and I asked him to come back for a contrasted brain MRI. His MRA head showed mild diffuse atherosclerotic disease. He had a split-night sleep study on 01/23/2013 and I went over his test results with him in detail today. His sleep efficiency at baseline was reduced at 78.6% with a latency to sleep of 22.5 minutes and wake after sleep onset of 13.5 minutes with severe sleep fragmentation noted. He had an elevated arousal index. He had an elevation in light stage sleep. He had absence of slow-wave sleep and a normal percentage of REM sleep. He had a normal REM latency. Rare PVCs were noted on EKG. He had mild to moderate snoring. His overall AHI was elevated at 47.1 per hour. His baseline oxygen saturation was 92%. His nadir was 80% during REM sleep. He was then titrated on CPAP from 5-6 cm of water pressure and had complete resolution of his sleep disorder breathing on a pressure of 6. He was noted to have some periodic leg movements of sleep before and especially after CPAP. Based on the sleep test results I  prescribed CPAP for him. I also reviewed his compliance data from 02/18/2013 through 03/19/2013 which is a total of 30 days during which time he uses CPAP every day. His percent used days greater than 4 hours was 80%, indicating good compliance. His average usage for all days was 5 hours and 17 minutes. His residual AHI was suboptimal at 8.8 per hour on a pressure of 6 cm with EPR of 1. His most recent compliance data is from 02/18/2013 through 04/07/2013 which is a total of 49 days during which time he uses CPAP every day. His average usage was 5 hours and 39 minutes. His percent used days greater than 4 hours was 82% indicating good compliance. His residual AHI was elevated at 7.1 per hour at a pressure of 7 cm with EPR of 1. His AHI was a little less than on the pressure of 6 cm, but still above 5/h which we like to see the residual AHI less than 5 per hour. He reports no further episodes of diplopia. Based on the very short nature of the diplopia I felt that it was not likely that he had a TIA. He did report some intermittent headaches. As far as his sleep is concerned, he does not sleep much better at night, but notes, that his HAs are better and he is not as tired during the day. From that aspect, he feels better. He had to switch from a nasal pillows to a nasal mask d/t nasal irritation and congestion. He has not  had any new neurological complaints.   His Past Medical History Is Significant For: Past Medical History  Diagnosis Date  . HTN (hypertension)   . Hypercholesteremia   . Depression   . Cancer     Renal  . H/O diplopia 01/10/2013    His Past Surgical History Is Significant For: Past Surgical History  Procedure Laterality Date  . Kidney surgery  2009  . Penile prosthesis implant  2011    His Family History Is Significant For: Family History  Problem Relation Age of Onset  . Cancer Mother   . Heart failure Father     His Social History Is Significant For: History   Social  History  . Marital Status: Married    Spouse Name: N/A    Number of Children: N/A  . Years of Education: N/A   Social History Main Topics  . Smoking status: Current Every Day Smoker -- 1.00 packs/day    Types: Cigarettes  . Smokeless tobacco: None  . Alcohol Use: No  . Drug Use: No  . Sexual Activity: None   Other Topics Concern  . None   Social History Narrative  . None    His Allergies Are:  Allergies  Allergen Reactions  . Nsaids   :   His Current Medications Are:  Outpatient Encounter Prescriptions as of 04/08/2013  Medication Sig  . allopurinol (ZYLOPRIM) 100 MG tablet   . buPROPion (WELLBUTRIN XL) 150 MG 24 hr tablet   . citalopram (CELEXA) 40 MG tablet   . clonazePAM (KLONOPIN) 0.5 MG tablet   . COLCRYS 0.6 MG tablet Take 1 tablet by mouth daily.  . furosemide (LASIX) 80 MG tablet   . KLOR-CON M20 20 MEQ tablet   . LIPITOR 40 MG tablet   . metoprolol succinate (TOPROL-XL) 100 MG 24 hr tablet   . omeprazole (PRILOSEC) 40 MG capsule   . ramipril (ALTACE) 5 MG capsule   . zolpidem (AMBIEN) 10 MG tablet   . clopidogrel (PLAVIX) 75 MG tablet    Review of Systems:  Out of a complete 14 point review of systems, all are reviewed and negative with the exception of these symptoms as listed below:  Review of Systems  Constitutional: Negative.   HENT: Negative.   Eyes: Negative.   Respiratory: Negative.   Cardiovascular: Negative.   Gastrointestinal: Negative.   Endocrine: Negative.   Genitourinary: Negative.   Musculoskeletal: Negative.   Skin: Negative.   Allergic/Immunologic: Negative.   Neurological: Negative.   Hematological: Negative.   Psychiatric/Behavioral: Negative.   All other systems reviewed and are negative.    Objective:  Neurologic Exam  Physical Exam Physical Examination:   Filed Vitals:   04/08/13 1039  BP: 112/72  Pulse: 66  Temp: 98 F (36.7 C)    General Examination: The patient is a very pleasant 67 y.o. male in no acute  distress. He appears well-developed and well-nourished and well groomed.   HEENT: Normocephalic, atraumatic, pupils are equal, round and reactive to light and accommodation. Extraocular tracking is good without limitation to gaze excursion or nystagmus noted. Normal smooth pursuit is noted. Hearing is grossly intact. Face is asymmetric with normal facial animation with R LMN type weakness and L hemifacial spasm, and normal facial sensation. Speech is clear with no dysarthria noted. There is no hypophonia. There is no lip, neck/head, jaw or voice tremor. Neck is supple with full range of passive and active motion. There are no carotid bruits on auscultation. Oropharynx exam reveals:  mild mouth dryness, adequate dental hygiene and mild airway crowding, due to redundant soft palate. Uvula is absent, he is status post uvulectomy. Tonsils are 1+. Nasal inspection shows no significant mucosal bogginess but he does have a septal deviation.  Chest: Clear to auscultation without wheezing, rhonchi or crackles noted.  Heart: S1+S2+0, regular and normal without murmurs, rubs or gallops noted.   Abdomen: Soft, non-tender and non-distended with normal bowel sounds appreciated on auscultation.  Extremities: There is 1+ pitting edema in the distal lower extremities bilaterally, left more than right. Pedal pulses are intact.  Skin: Warm and dry without trophic changes noted. There are no varicose veins.  Musculoskeletal: exam reveals no obvious joint deformities, tenderness or joint swelling or erythema.   Neurologically:  Mental status: The patient is awake, alert and oriented in all 4 spheres. His memory, attention, language and knowledge are appropriate. There is no aphasia, agnosia, apraxia or anomia. Speech is clear with normal prosody and enunciation. Thought process is linear. Mood is congruent and affect is normal.  Cranial nerves are as described above under HEENT exam. In addition, shoulder shrug is  normal with equal shoulder height noted. Motor exam: Normal bulk, strength and tone is noted. There is no drift, tremor or rebound. Romberg is negative. Reflexes are 2+ throughout. Fine motor skills are intact with normal finger taps, normal hand movements, normal rapid alternating patting, normal foot taps and normal foot agility.  Cerebellar testing shows no dysmetria or intention tremor on finger to nose testing. There is no truncal or gait ataxia.  Sensory exam is intact to light touch, pinprick, vibration, temperature sense in the upper and lower extremities.  Gait, station and balance are unremarkable. No veering to one side is noted. No leaning to one side is noted. Posture is age-appropriate and stance is narrow based. No problems turning are noted. He turns en bloc.   Assessment and Plan:   In summary, TAYGEN ACKLIN is a very pleasant 67 y.o.-year old male with a history of OSA, on CPAP treatment at a pressure of 7 cwp. His physical exam is stable and he indicates good results with the use of CPAP, and good tolerance of the pressure and mask. I reviewed the compliance data with the patient and commended him for being this compliant. I also discussed sleep study findings with him in detail. He is encouraged to continue to use CPAP regularly to help reduce cardiovascular risk. To further improve his residual AHI I would like to increase his treatment pressure to 8 cm. he has not had any further episodes of diplopia and his headaches have improved.  We also talked about trying to maintaining a healthy lifestyle in general. I encouraged the patient to eat healthy, exercise daily and keep well hydrated, to keep a scheduled bedtime and wake time routine, to not skip any meals and eat healthy snacks in between meals and to have protein with every meal. I stressed the importance of regular exercise. I again asked him to quit smoking, but he is not willing to quit at this time.  I answered all his  questions today and the patient was in agreement with the above outlined plan. I would like to see the patient back in 6 months, sooner if the need arises and encouraged him to call with any interim questions, concerns, problems or updates  Most of my 30 minute visit today was spent in counseling and coordination of care, reviewing test results and reviewing medication.

## 2013-04-08 NOTE — Patient Instructions (Addendum)
Please continue using your CPAP regularly. While your insurance requires that you use CPAP at least 4 hours each night on 70% of the nights, I recommend, that you not skip any nights and use it throughout the night if you can. Getting used to CPAP does take time and patience and discipline. Untreated obstructive sleep apnea when it is moderate to severe can have an adverse impact on cardiovascular health and raise her risk for heart disease, arrhythmias, hypertension, congestive heart failure, stroke and diabetes. Untreated obstructive sleep apnea causes sleep disruption, nonrestorative sleep, and sleep deprivation. This can have an impact on your day to day functioning and cause daytime sleepiness and impairment of cognitive function, memory loss, mood disturbance, and problems focussing. Using CPAP regularly can improve these symptoms.  Keep up the good work! I will see you back in 6 months for sleep apnea check up, and if you continue to do well on CPAP I will see you once a year thereafter.   Please stop smoking.

## 2013-04-16 ENCOUNTER — Encounter: Payer: Self-pay | Admitting: Neurology

## 2013-05-14 ENCOUNTER — Encounter: Payer: Self-pay | Admitting: Neurology

## 2013-05-17 ENCOUNTER — Other Ambulatory Visit: Payer: Self-pay | Admitting: Dermatology

## 2013-05-21 NOTE — Progress Notes (Signed)
Quick Note:  I reviewed the patient's CPAP compliance data from 03/31/2013 to 04/29/2013, which is a total of 30 days, during which time the patient used CPAP every day. The average usage for all days was 6 hours and 36 minutes. The percent used days greater than 4 hours was 97 %, indicating excellent compliance. The residual AHI was 3.7 per hour, indicating a good treatment pressure of 8 cwp with EPR of 1. I will review this data with the patient at the next office visit, which is scheduled for 10/08/2013 at 11:30 AM, provide feedback and additional troubleshooting if need be.  Star Age, MD, PhD [facility] ([facility]   ______

## 2013-05-27 ENCOUNTER — Encounter: Payer: Self-pay | Admitting: Neurology

## 2013-08-07 ENCOUNTER — Other Ambulatory Visit: Payer: Self-pay | Admitting: Neurology

## 2013-08-07 DIAGNOSIS — Z9989 Dependence on other enabling machines and devices: Principal | ICD-10-CM

## 2013-08-07 DIAGNOSIS — G4733 Obstructive sleep apnea (adult) (pediatric): Secondary | ICD-10-CM

## 2013-10-08 ENCOUNTER — Encounter: Payer: Self-pay | Admitting: Neurology

## 2013-10-08 ENCOUNTER — Ambulatory Visit (INDEPENDENT_AMBULATORY_CARE_PROVIDER_SITE_OTHER): Payer: BC Managed Care – PPO | Admitting: Neurology

## 2013-10-08 ENCOUNTER — Ambulatory Visit (HOSPITAL_COMMUNITY)
Admission: RE | Admit: 2013-10-08 | Discharge: 2013-10-08 | Disposition: A | Discharge status: Home / Self Care | Payer: BC Managed Care – PPO | Source: Ambulatory Visit | Attending: Urology | Admitting: Urology

## 2013-10-08 ENCOUNTER — Other Ambulatory Visit: Payer: Self-pay | Admitting: Urology

## 2013-10-08 VITALS — BP 133/83 | HR 65 | Temp 98.0°F | Ht 72.5 in | Wt 232.0 lb

## 2013-10-08 DIAGNOSIS — F17200 Nicotine dependence, unspecified, uncomplicated: Secondary | ICD-10-CM

## 2013-10-08 DIAGNOSIS — Z9989 Dependence on other enabling machines and devices: Principal | ICD-10-CM

## 2013-10-08 DIAGNOSIS — G518 Other disorders of facial nerve: Secondary | ICD-10-CM

## 2013-10-08 DIAGNOSIS — C649 Malignant neoplasm of unspecified kidney, except renal pelvis: Secondary | ICD-10-CM

## 2013-10-08 DIAGNOSIS — G4733 Obstructive sleep apnea (adult) (pediatric): Secondary | ICD-10-CM

## 2013-10-08 DIAGNOSIS — Z8669 Personal history of other diseases of the nervous system and sense organs: Secondary | ICD-10-CM

## 2013-10-08 DIAGNOSIS — G5139 Clonic hemifacial spasm, unspecified: Secondary | ICD-10-CM

## 2013-10-08 NOTE — Patient Instructions (Signed)
Please continue using your CPAP regularly. While your insurance requires that you use CPAP at least 4 hours each night on 70% of the nights, I recommend, that you not skip any nights and use it throughout the night if you can. Getting used to CPAP and staying with the treatment long term does take time and patience and discipline. Untreated obstructive sleep apnea when it is moderate to severe can have an adverse impact on cardiovascular health and raise her risk for heart disease, arrhythmias, hypertension, congestive heart failure, stroke and diabetes. Untreated obstructive sleep apnea causes sleep disruption, nonrestorative sleep, and sleep deprivation. This can have an impact on your day to day functioning and cause daytime sleepiness and impairment of cognitive function, memory loss, mood disturbance, and problems focussing. Using CPAP regularly can improve these symptoms.  I can see you back in one year, as you are doing well.   Please make sure you take your CPAP machine with you on vacation and use it too!  Please stop smoking.   Please take a daily baby aspirin, since you stopped the Plavix.

## 2013-10-08 NOTE — Progress Notes (Signed)
Subjective:    Patient ID: Edward [name] is a 68 y.o. male.  HPI    Interim history:   Edward [name] is a 68 year old right-handed gentleman with an underlying medical history of renal cancer, status post nephrectomy, chronic kidney disease stage III, vitamin D deficiency, cervical spinal stenosis, hyperlipidemia, depression, insomnia, reflux disease, gout, history of Bell's palsy, erectile dysfunction, hypertension and smoking who presents for followup consultation of his prior episodes of double vision and his obstructive sleep apnea. He is unaccompanied today. I last saw him on 04/08/2013, at which time his physical exam was stable and he indicated good results with the use of CPAP. He reported no further episodes of diplopia. He did report some intermittent headaches, which improved with CPAP. He had to switch from a nasal pillows to a nasal mask d/t nasal irritation and congestion. I did increase his treatment pressure to 8 cm as he had a residual AHI of 7.1/hour. I reviewed the patient's CPAP compliance data from 03/31/2013 to 04/29/2013, which is a total of 30 days, during which time the patient used CPAP every day. The average usage for all days was 6 hours and 36 minutes. The percent used days greater than 4 hours was 97 %, indicating excellent compliance. The residual AHI was 3.7 per hour, indicating a good treatment pressure of 8 cwp with EPR of 1.  Today, I reviewed his compliance data from 09/08/2013 through 10/07/2013 which is the last 30 days during which time he uses CPAP every night except for 3 nights. Percent used days greater than 4 hours was 90%, indicating excellent compliance. Average usage was 5 hours and 33 minutes. Residual AHI low at 2.1 per hour and leak was sometimes high but reasonable overall.   Today, he reports no recent problems, no recent illness. He is trying to wean off clonazepam and the Wellbutrin. He sees [name], [place] with Dr. Eddie Dibbles office. He has  been using his CPAP faithfully, with the exception of this past long weekends when he went to [employer] and while he did take his machine he did not use it. Otherwise he has no complaints with compliance, mask fitting, tolerating the pressure etc. He just had a recent mask replacement.  He still smokes about 10-12 cigarettes a day. His mother-in-law recently passed away about 2 weeks ago. He picked up some more smoking since her death. He also reports that he stopped taking Plavix because he was having too much bruising and easy bleeding. He has not been taking an aspirin either.  I first met him on 01/10/2013 at which time he reported that he was diagnosed with obstructive sleep apnea in the past but did not use CPAP because of inconvenience. I suggested a brain MRI and brain MRA as well as a sleep study. His brain MRI showed white matter changes and one larger focus of white matter change and I asked him to come back for a contrasted brain MRI. His MRA head showed mild diffuse atherosclerotic disease. He had a split-night sleep study on 01/23/2013 and I previously discussed his test results with him in detail: His sleep efficiency at baseline was reduced at 78.6% with a latency to sleep of 22.5 minutes and wake after sleep onset of 13.5 minutes with severe sleep fragmentation noted. He had an elevated arousal index. He had an elevation in light stage sleep. He had absence of slow-wave sleep and a normal percentage of REM sleep. He had a normal REM latency. Rare PVCs  were noted on EKG. He had mild to moderate snoring. His overall AHI was highly elevated at 47.1 per hour. His baseline oxygen saturation was 92%. His nadir was 80% during REM sleep. He was then titrated on CPAP from 5-6 cm of water pressure and had complete resolution of his sleep disorder breathing on a pressure of 6 cm. He was noted to have some periodic leg movements of sleep before and especially after CPAP. Based on the sleep test results I  prescribed CPAP for him. I also reviewed his compliance data from 02/18/2013 to 03/19/2013 (30 days), during which time he used CPAP every day. His percent used days greater than 4 hours was 80%, indicating good compliance. His average usage for all days was 5 hours and 17 minutes. His residual AHI was suboptimal at 8.8 per hour on a pressure of 6 cm with EPR of 1.  I also reviewed his compliance data is from 02/18/2013 to 04/07/2013 (49 days), during which time he used CPAP every day. His average usage was 5 hours and 39 minutes. His percent used days greater than 4 hours was 82% indicating good compliance. His residual AHI was elevated at 7.1 per hour at a pressure of 7 cm with EPR of 1.    His Past Medical History Is Significant For: Past Medical History  Diagnosis Date  . HTN (hypertension)   . Hypercholesteremia   . Depression   . Cancer     Renal  . H/O diplopia 01/10/2013  . OSA on CPAP 04/08/2013    His Past Surgical History Is Significant For: Past Surgical History  Procedure Laterality Date  . Kidney surgery  2009  . Penile prosthesis implant  2011    His Family History Is Significant For: Family History  Problem Relation Age of Onset  . Cancer Mother   . Heart failure Father     His Social History Is Significant For: History   Social History  . Marital Status: Married    Spouse Name: N/A    Number of Children: N/A  . Years of Education: N/A   Social History Main Topics  . Smoking status: Current Every Day Smoker -- 1.00 packs/day    Types: Cigarettes  . Smokeless tobacco: None  . Alcohol Use: No  . Drug Use: No  . Sexual Activity: None   Other Topics Concern  . None   Social History Narrative  . None    His Allergies Are:  Allergies  Allergen Reactions  . Nsaids   :   His Current Medications Are:  Outpatient Encounter Prescriptions as of 10/08/2013  Medication Sig  . allopurinol (ZYLOPRIM) 100 MG tablet   . buPROPion (WELLBUTRIN XL) 150 MG 24 hr  tablet   . buPROPion (WELLBUTRIN XL) 300 MG 24 hr tablet Take 300 mg by mouth daily.  . citalopram (CELEXA) 40 MG tablet   . clonazePAM (KLONOPIN) 0.5 MG tablet   . COLCRYS 0.6 MG tablet Take 1 tablet by mouth daily.  . furosemide (LASIX) 80 MG tablet   . KLOR-CON M20 20 MEQ tablet   . LIPITOR 40 MG tablet   . metoprolol succinate (TOPROL-XL) 100 MG 24 hr tablet   . omeprazole (PRILOSEC) 40 MG capsule   . ramipril (ALTACE) 5 MG capsule   . zolpidem (AMBIEN) 10 MG tablet   . [DISCONTINUED] clopidogrel (PLAVIX) 75 MG tablet   :  Review of Systems:  Out of a complete 14 point review of systems, all are reviewed  and negative with the exception of these symptoms as listed below:  Review of Systems  Constitutional: Negative.   HENT: Negative.   Eyes: Negative.   Respiratory: Negative.   Cardiovascular: Negative.   Gastrointestinal: Negative.   Endocrine: Negative.   Genitourinary: Negative.   Musculoskeletal: Negative.   Skin: Negative.   Allergic/Immunologic: Negative.   Neurological: Negative.   Hematological: Bruises/bleeds easily.  Psychiatric/Behavioral: Negative.     Objective:  Neurologic Exam  Physical Exam Physical Examination:   Filed Vitals:   10/08/13 1107  BP: 133/83  Pulse: 65  Temp: 98 F (36.7 C)    General Examination: The patient is a very pleasant 68 y.o. male in no acute distress. He appears well-developed and well-nourished and well groomed. He is in good spirits today.  HEENT: Normocephalic, atraumatic, pupils are equal, round and reactive to light and accommodation. Extraocular tracking is good without limitation to gaze excursion or nystagmus noted. Normal smooth pursuit is noted. Hearing is grossly intact. Face is asymmetric with normal facial animation with R LMN type weakness and R hemifacial spasm, and normal facial sensation. Speech is clear with no dysarthria noted. There is no hypophonia. There is no lip, neck/head, jaw or voice tremor. Neck  is supple with full range of passive and active motion. There are no carotid bruits on auscultation. Oropharynx exam reveals: mild mouth dryness, adequate dental hygiene and mild airway crowding, due to redundant soft palate. Uvula is absent, he is status post uvulectomy. Tonsils are 1+. Nasal inspection shows no significant mucosal bogginess but he does have a septal deviation to the right with the left nasal passage more patent.   Chest: Clear to auscultation without wheezing, rhonchi or crackles noted.  Heart: S1+S2+0, regular and normal without murmurs, rubs or gallops noted.   Abdomen: Soft, non-tender and non-distended with normal bowel sounds appreciated on auscultation.  Extremities: There is 1+ pitting edema in the distal lower extremities bilaterally, left more than right. Pedal pulses are intact.  Skin: Warm and dry without trophic changes noted. There are no varicose veins.  Musculoskeletal: exam reveals no obvious joint deformities, tenderness or joint swelling or erythema.   Neurologically:  Mental status: The patient is awake, alert and oriented in all 4 spheres. His memory, attention, language and knowledge are appropriate. There is no aphasia, agnosia, apraxia or anomia. Speech is clear with normal prosody and enunciation. Thought process is linear. Mood is congruent and affect is normal.  Cranial nerves are as described above under HEENT exam. In addition, shoulder shrug is normal with equal shoulder height noted. Motor exam: Normal bulk, strength and tone is noted. There is no drift, tremor or rebound. Romberg is negative. Reflexes are 2+ throughout. Fine motor skills are intact with normal finger taps, normal hand movements, normal rapid alternating patting, normal foot taps and normal foot agility.  Cerebellar testing shows no dysmetria or intention tremor on finger to nose testing. There is no truncal or gait ataxia.  Sensory exam is intact to light touch the upper and lower  extremities.  Gait, station and balance are unremarkable. No veering to one side is noted. No leaning to one side is noted. Posture is age-appropriate and stance is narrow based. No problems turning are noted. He turns en bloc.   Assessment and Plan:   In summary, GAVYN YBARRA is a very pleasant 68 year old male with an underlying medical history of renal cancer, status post nephrectomy, chronic kidney disease stage III, vitamin D deficiency, cervical  spinal stenosis, hyperlipidemia, depression, insomnia, reflux disease, gout, history of Bell's palsy, erectile dysfunction, hypertension and smoking who presents for followup consultation of his prior episodes of double vision and his obstructive sleep apnea. He is well established with CPAP therapy and compliant with treatment. He has had no further episodes of diplopia and his physical exam remains stable and he indicates good tolerance of the CPAP pressure and mask. I reviewed  his prior sleep test result with him as well as his most recent compliance data with the patient and commended him for being compliant.  I did ask him to stop smoking. I also advised her to start taking a baby aspirin since he stopped taking Plavix and he has vascular risk factors for heart disease and stroke. He is encouraged to continue to use CPAP regularly to help reduce cardiovascular risk. His AHI improved after he had an increase in treatment pressure to 8 cm. We also talked about trying to maintaining a healthy lifestyle in general. I encouraged the patient to eat healthy, exercise daily and keep well hydrated, to keep a scheduled bedtime and wake time routine, to not skip any meals and eat healthy snacks in between meals and to have protein with every meal. I stressed the importance of regular exercise. I again asked him to quit smoking and I also reminded him to take a CPAP machine on trips and actually also use it. He demonstrated understanding.   I answered all his  questions today and the patient was in agreement with the above outlined plan. I would like to see the patient back in 12 months, sooner if the need arises and encouraged him to call with any interim questions, concerns, problems or updates.

## 2013-12-05 ENCOUNTER — Other Ambulatory Visit: Payer: Self-pay | Admitting: Dermatology

## 2013-12-29 ENCOUNTER — Inpatient Hospital Stay (HOSPITAL_COMMUNITY)
Admission: EM | Admit: 2013-12-29 | Discharge: 2014-01-06 | DRG: 025 | Disposition: A | Discharge status: Home / Self Care | Payer: BC Managed Care – PPO | Attending: Neurosurgery | Admitting: Neurosurgery

## 2013-12-29 ENCOUNTER — Encounter (HOSPITAL_COMMUNITY): Payer: Self-pay | Admitting: Emergency Medicine

## 2013-12-29 ENCOUNTER — Emergency Department (HOSPITAL_COMMUNITY): Payer: BC Managed Care – PPO

## 2013-12-29 DIAGNOSIS — C719 Malignant neoplasm of brain, unspecified: Secondary | ICD-10-CM

## 2013-12-29 DIAGNOSIS — R222 Localized swelling, mass and lump, trunk: Secondary | ICD-10-CM | POA: Diagnosis present

## 2013-12-29 DIAGNOSIS — C711 Malignant neoplasm of frontal lobe: Principal | ICD-10-CM | POA: Diagnosis present

## 2013-12-29 DIAGNOSIS — E7800 Pure hypercholesterolemia, unspecified: Secondary | ICD-10-CM | POA: Diagnosis present

## 2013-12-29 DIAGNOSIS — F17200 Nicotine dependence, unspecified, uncomplicated: Secondary | ICD-10-CM

## 2013-12-29 DIAGNOSIS — Z905 Acquired absence of kidney: Secondary | ICD-10-CM

## 2013-12-29 DIAGNOSIS — G939 Disorder of brain, unspecified: Secondary | ICD-10-CM

## 2013-12-29 DIAGNOSIS — Z8249 Family history of ischemic heart disease and other diseases of the circulatory system: Secondary | ICD-10-CM

## 2013-12-29 DIAGNOSIS — R918 Other nonspecific abnormal finding of lung field: Secondary | ICD-10-CM

## 2013-12-29 DIAGNOSIS — N183 Chronic kidney disease, stage 3 unspecified: Secondary | ICD-10-CM | POA: Diagnosis present

## 2013-12-29 DIAGNOSIS — I10 Essential (primary) hypertension: Secondary | ICD-10-CM

## 2013-12-29 DIAGNOSIS — Z886 Allergy status to analgesic agent status: Secondary | ICD-10-CM

## 2013-12-29 DIAGNOSIS — Z85528 Personal history of other malignant neoplasm of kidney: Secondary | ICD-10-CM

## 2013-12-29 DIAGNOSIS — D72829 Elevated white blood cell count, unspecified: Secondary | ICD-10-CM | POA: Diagnosis not present

## 2013-12-29 DIAGNOSIS — N179 Acute kidney failure, unspecified: Secondary | ICD-10-CM

## 2013-12-29 DIAGNOSIS — F3289 Other specified depressive episodes: Secondary | ICD-10-CM | POA: Diagnosis present

## 2013-12-29 DIAGNOSIS — F411 Generalized anxiety disorder: Secondary | ICD-10-CM | POA: Diagnosis present

## 2013-12-29 DIAGNOSIS — E785 Hyperlipidemia, unspecified: Secondary | ICD-10-CM

## 2013-12-29 DIAGNOSIS — G9389 Other specified disorders of brain: Secondary | ICD-10-CM

## 2013-12-29 DIAGNOSIS — R5381 Other malaise: Secondary | ICD-10-CM | POA: Diagnosis not present

## 2013-12-29 DIAGNOSIS — Z6829 Body mass index (BMI) 29.0-29.9, adult: Secondary | ICD-10-CM

## 2013-12-29 DIAGNOSIS — Z85828 Personal history of other malignant neoplasm of skin: Secondary | ICD-10-CM

## 2013-12-29 DIAGNOSIS — F329 Major depressive disorder, single episode, unspecified: Secondary | ICD-10-CM | POA: Diagnosis present

## 2013-12-29 DIAGNOSIS — D496 Neoplasm of unspecified behavior of brain: Secondary | ICD-10-CM | POA: Diagnosis present

## 2013-12-29 DIAGNOSIS — G936 Cerebral edema: Secondary | ICD-10-CM

## 2013-12-29 DIAGNOSIS — T380X5A Adverse effect of glucocorticoids and synthetic analogues, initial encounter: Secondary | ICD-10-CM | POA: Diagnosis not present

## 2013-12-29 DIAGNOSIS — G510 Bell's palsy: Secondary | ICD-10-CM | POA: Diagnosis present

## 2013-12-29 DIAGNOSIS — G4733 Obstructive sleep apnea (adult) (pediatric): Secondary | ICD-10-CM

## 2013-12-29 DIAGNOSIS — Z9989 Dependence on other enabling machines and devices: Secondary | ICD-10-CM

## 2013-12-29 LAB — URINALYSIS, ROUTINE W REFLEX MICROSCOPIC
Bilirubin Urine: NEGATIVE
GLUCOSE, UA: NEGATIVE mg/dL
Hgb urine dipstick: NEGATIVE
Ketones, ur: NEGATIVE mg/dL
LEUKOCYTES UA: NEGATIVE
Nitrite: NEGATIVE
PH: 6 (ref 5.0–8.0)
Protein, ur: NEGATIVE mg/dL
SPECIFIC GRAVITY, URINE: 1.019 (ref 1.005–1.030)
Urobilinogen, UA: 1 mg/dL (ref 0.0–1.0)

## 2013-12-29 LAB — COMPREHENSIVE METABOLIC PANEL
ALT: 18 U/L (ref 0–53)
ANION GAP: 11 (ref 5–15)
AST: 19 U/L (ref 0–37)
Albumin: 3.1 g/dL — ABNORMAL LOW (ref 3.5–5.2)
Alkaline Phosphatase: 122 U/L — ABNORMAL HIGH (ref 39–117)
BILIRUBIN TOTAL: 0.3 mg/dL (ref 0.3–1.2)
BUN: 28 mg/dL — AB (ref 6–23)
CHLORIDE: 102 meq/L (ref 96–112)
CO2: 25 mEq/L (ref 19–32)
Calcium: 9.1 mg/dL (ref 8.4–10.5)
Creatinine, Ser: 2.18 mg/dL — ABNORMAL HIGH (ref 0.50–1.35)
GFR calc non Af Amer: 30 mL/min — ABNORMAL LOW (ref >90)
GFR, EST AFRICAN AMERICAN: 35 mL/min — AB (ref >90)
GLUCOSE: 93 mg/dL (ref 70–99)
Potassium: 4.6 mEq/L (ref 3.7–5.3)
Sodium: 138 mEq/L (ref 137–147)
Total Protein: 6.8 g/dL (ref 6.0–8.3)

## 2013-12-29 LAB — CBC
HCT: 36.6 % — ABNORMAL LOW (ref 39.0–52.0)
HEMOGLOBIN: 11.7 g/dL — AB (ref 13.0–17.0)
MCH: 30.1 pg (ref 26.0–34.0)
MCHC: 32 g/dL (ref 30.0–36.0)
MCV: 94.1 fL (ref 78.0–100.0)
Platelets: 290 10*3/uL (ref 150–400)
RBC: 3.89 MIL/uL — AB (ref 4.22–5.81)
RDW: 13.3 % (ref 11.5–15.5)
WBC: 12.9 10*3/uL — ABNORMAL HIGH (ref 4.0–10.5)

## 2013-12-29 LAB — RAPID URINE DRUG SCREEN, HOSP PERFORMED
Amphetamines: NOT DETECTED
Barbiturates: NOT DETECTED
Benzodiazepines: NOT DETECTED
Cocaine: NOT DETECTED
Opiates: POSITIVE — AB
Tetrahydrocannabinol: NOT DETECTED

## 2013-12-29 LAB — I-STAT CHEM 8, ED
BUN: 27 mg/dL — ABNORMAL HIGH (ref 6–23)
CHLORIDE: 105 meq/L (ref 96–112)
Calcium, Ion: 1.14 mmol/L (ref 1.13–1.30)
Creatinine, Ser: 2.3 mg/dL — ABNORMAL HIGH (ref 0.50–1.35)
GLUCOSE: 97 mg/dL (ref 70–99)
HCT: 38 % — ABNORMAL LOW (ref 39.0–52.0)
Hemoglobin: 12.9 g/dL — ABNORMAL LOW (ref 13.0–17.0)
Potassium: 4.4 mEq/L (ref 3.7–5.3)
Sodium: 137 mEq/L (ref 137–147)
TCO2: 25 mmol/L (ref 0–100)

## 2013-12-29 LAB — PROTIME-INR
INR: 1.08 (ref 0.00–1.49)
Prothrombin Time: 14 seconds (ref 11.6–15.2)

## 2013-12-29 LAB — DIFFERENTIAL
Basophils Absolute: 0.1 10*3/uL (ref 0.0–0.1)
Basophils Relative: 0 % (ref 0–1)
EOS ABS: 0.3 10*3/uL (ref 0.0–0.7)
Eosinophils Relative: 2 % (ref 0–5)
LYMPHS ABS: 2.7 10*3/uL (ref 0.7–4.0)
Lymphocytes Relative: 21 % (ref 12–46)
MONO ABS: 1.3 10*3/uL — AB (ref 0.1–1.0)
MONOS PCT: 10 % (ref 3–12)
NEUTROS ABS: 8.6 10*3/uL — AB (ref 1.7–7.7)
NEUTROS PCT: 67 % (ref 43–77)

## 2013-12-29 LAB — CBG MONITORING, ED: GLUCOSE-CAPILLARY: 90 mg/dL (ref 70–99)

## 2013-12-29 LAB — I-STAT TROPONIN, ED: Troponin i, poc: 0 ng/mL (ref 0.00–0.08)

## 2013-12-29 LAB — APTT: aPTT: 31 seconds (ref 24–37)

## 2013-12-29 LAB — ETHANOL: Alcohol, Ethyl (B): <11 mg/dL (ref 0–11)

## 2013-12-29 MED ORDER — METOPROLOL SUCCINATE ER 100 MG PO TB24
100.0000 mg | ORAL_TABLET | Freq: Every day | ORAL | Status: DC
  Administered 2013-12-30 – 2014-01-05 (×5): 100 mg via ORAL
  Filled 2013-12-29 (×7): qty 1

## 2013-12-29 MED ORDER — ONDANSETRON HCL 4 MG PO TABS: 4.0000 mg | ORAL_TABLET | Freq: Four times a day (QID) | ORAL | Status: DC | PRN

## 2013-12-29 MED ORDER — ALUM & MAG HYDROXIDE-SIMETH 200-200-20 MG/5ML PO SUSP: 30.0000 mL | Freq: Four times a day (QID) | ORAL | Status: DC | PRN

## 2013-12-29 MED ORDER — ZOLPIDEM TARTRATE 5 MG PO TABS
5.0000 mg | ORAL_TABLET | Freq: Every day | ORAL | Status: DC
  Administered 2013-12-30 – 2014-01-05 (×7): 5 mg via ORAL
  Filled 2013-12-29 (×7): qty 1

## 2013-12-29 MED ORDER — DEXAMETHASONE SODIUM PHOSPHATE 10 MG/ML IJ SOLN
10.0000 mg | Freq: Four times a day (QID) | INTRAMUSCULAR | Status: DC
  Administered 2013-12-29 – 2013-12-30 (×4): 10 mg via INTRAVENOUS
  Filled 2013-12-29 (×4): qty 1

## 2013-12-29 MED ORDER — BUPROPION HCL ER (XL) 150 MG PO TB24
300.0000 mg | ORAL_TABLET | Freq: Every day | ORAL | Status: DC
  Administered 2013-12-30 – 2014-01-06 (×7): 300 mg via ORAL
  Filled 2013-12-29 (×2): qty 1
  Filled 2013-12-29: qty 2
  Filled 2013-12-29: qty 1
  Filled 2013-12-29 (×3): qty 2

## 2013-12-29 MED ORDER — ATORVASTATIN CALCIUM 40 MG PO TABS
40.0000 mg | ORAL_TABLET | Freq: Every day | ORAL | Status: DC
  Administered 2013-12-30 – 2014-01-06 (×7): 40 mg via ORAL
  Filled 2013-12-29 (×7): qty 1

## 2013-12-29 MED ORDER — SODIUM CHLORIDE 0.9 % IV SOLN
INTRAVENOUS | Status: DC
  Administered 2013-12-29 – 2014-01-01 (×4): via INTRAVENOUS

## 2013-12-29 MED ORDER — ONDANSETRON HCL 4 MG/2ML IJ SOLN
4.0000 mg | Freq: Four times a day (QID) | INTRAMUSCULAR | Status: DC | PRN
  Administered 2013-12-31 – 2014-01-02 (×3): 4 mg via INTRAVENOUS
  Filled 2013-12-29 (×3): qty 2

## 2013-12-29 MED ORDER — HYDROMORPHONE HCL PF 1 MG/ML IJ SOLN
0.5000 mg | INTRAMUSCULAR | Status: DC | PRN
  Administered 2014-01-02: 1 mg via INTRAVENOUS
  Filled 2013-12-29: qty 1

## 2013-12-29 MED ORDER — CITALOPRAM HYDROBROMIDE 40 MG PO TABS
40.0000 mg | ORAL_TABLET | Freq: Every day | ORAL | Status: DC
  Administered 2013-12-30 – 2013-12-31 (×2): 40 mg via ORAL
  Filled 2013-12-29 (×2): qty 1

## 2013-12-29 MED ORDER — SODIUM CHLORIDE 0.9 % IV BOLUS (SEPSIS)
1000.0000 mL | Freq: Once | INTRAVENOUS | Status: AC
  Administered 2013-12-29: 1000 mL via INTRAVENOUS

## 2013-12-29 MED ORDER — CLONAZEPAM 0.5 MG PO TABS
0.5000 mg | ORAL_TABLET | Freq: Every day | ORAL | Status: DC
  Administered 2013-12-30 – 2014-01-05 (×7): 0.5 mg via ORAL
  Filled 2013-12-29 (×7): qty 1

## 2013-12-29 MED ORDER — BUPROPION HCL ER (XL) 150 MG PO TB24
150.0000 mg | ORAL_TABLET | Freq: Every day | ORAL | Status: DC
  Administered 2013-12-30 – 2014-01-06 (×7): 150 mg via ORAL
  Filled 2013-12-29 (×6): qty 1

## 2013-12-29 MED ORDER — OXYCODONE HCL 5 MG PO TABS
5.0000 mg | ORAL_TABLET | ORAL | Status: DC | PRN
  Administered 2014-01-02 (×2): 5 mg via ORAL
  Filled 2013-12-29 (×2): qty 1

## 2013-12-29 MED ORDER — ALLOPURINOL 100 MG PO TABS
100.0000 mg | ORAL_TABLET | Freq: Every day | ORAL | Status: DC
  Administered 2013-12-30 – 2014-01-06 (×7): 100 mg via ORAL
  Filled 2013-12-29 (×7): qty 1

## 2013-12-29 MED ORDER — ACETAMINOPHEN 650 MG RE SUPP: 650.0000 mg | Freq: Four times a day (QID) | RECTAL | Status: DC | PRN

## 2013-12-29 MED ORDER — ACETAMINOPHEN 325 MG PO TABS
650.0000 mg | ORAL_TABLET | Freq: Four times a day (QID) | ORAL | Status: DC | PRN
  Administered 2013-12-29: 650 mg via ORAL
  Filled 2013-12-29: qty 2

## 2013-12-29 MED ORDER — DEXAMETHASONE SODIUM PHOSPHATE 10 MG/ML IJ SOLN: 10.0000 mg | Freq: Four times a day (QID) | INTRAMUSCULAR | Status: DC

## 2013-12-29 MED ORDER — PANTOPRAZOLE SODIUM 40 MG PO TBEC
40.0000 mg | DELAYED_RELEASE_TABLET | Freq: Every day | ORAL | Status: DC
Start: 2013-12-30 — End: 2014-01-01
  Administered 2013-12-30 – 2013-12-31 (×2): 40 mg via ORAL
  Filled 2013-12-29 (×2): qty 1

## 2013-12-29 NOTE — ED Notes (Addendum)
Pt sent by PMD for further eval of gait problem and confusion with slow response x 1 weeks or more. Pt fell 2 weeks ago.

## 2013-12-29 NOTE — ED Notes (Signed)
Collected urine and sent for testing.

## 2013-12-29 NOTE — Progress Notes (Signed)
RT Note: I spoke with RN about CPAP order, she asked the pt and at this time he does not want to wear a CPAP tonight. I told her to let RT know if he changes his mind.

## 2013-12-29 NOTE — ED Provider Notes (Signed)
CSN: [id number]     Arrival date & time 12/29/13  1841 History   First MD Initiated Contact with Patient 12/29/13 1910     Chief Complaint  Patient presents with  . Weakness     (Consider location/radiation/quality/duration/timing/severity/associated sxs/prior Treatment) Patient is a 68 y.o. male presenting with neurologic complaint. The history is provided by the patient.  Neurologic Problem This is a new problem. The current episode started more than 1 month ago. The problem occurs constantly. The problem has been unchanged. Associated symptoms include weakness. Pertinent negatives include no abdominal pain, arthralgias, chest pain, chills, congestion, fever, headaches, myalgias, nausea, numbness, rash, visual change or vomiting. Nothing aggravates the symptoms. He has tried nothing for the symptoms. The treatment provided no relief.   68yo M with a chief complaint of confusion difficulty walking. Family states is going on for the past 2 months. Patient with a history of renal cell carcinoma had kidney removed about 6 years ago. Family states that he recently bought a car which is out of the normal for him without discussing it with the other members of the family. Patient also has been forgetting things has fallen asleep in the tub recently. Patient started having a shuffling gait. Family denies fevers or chills. Patient denies cough chest pain abdominal pain.  Past Medical History  Diagnosis Date  . HTN (hypertension)   . Hypercholesteremia   . Depression   . Cancer     Renal  . H/O diplopia 01/10/2013  . OSA on CPAP 04/08/2013   Past Surgical History  Procedure Laterality Date  . Kidney surgery  2009  . Penile prosthesis implant  2011   Family History  Problem Relation Age of Onset  . Cancer Mother   . Heart failure Father    History  Substance Use Topics  . Smoking status: Current Every Day Smoker -- 1.00 packs/day    Types: Cigarettes  . Smokeless tobacco: Not on file   . Alcohol Use: No    Review of Systems  Constitutional: Negative for fever and chills.  HENT: Negative for congestion and facial swelling.   Eyes: Negative for discharge and visual disturbance.  Respiratory: Negative for shortness of breath.   Cardiovascular: Negative for chest pain and palpitations.  Gastrointestinal: Negative for nausea, vomiting, abdominal pain and diarrhea.  Musculoskeletal: Negative for arthralgias and myalgias.  Skin: Negative for color change and rash.  Neurological: Positive for weakness. Negative for tremors, syncope, numbness and headaches.  Psychiatric/Behavioral: Negative for confusion and dysphoric mood.      Allergies  Nsaids  Home Medications   Prior to Admission medications   Medication Sig Start Date End Date Taking? Authorizing Provider  allopurinol (ZYLOPRIM) 100 MG tablet Take 100 mg by mouth daily.  11/15/12  Yes Historical Provider, MD  buPROPion (WELLBUTRIN XL) 150 MG 24 hr tablet Take 150 mg by mouth daily.  01/07/13  Yes Historical Provider, MD  buPROPion (WELLBUTRIN XL) 300 MG 24 hr tablet Take 300 mg by mouth daily.   Yes Historical Provider, MD  citalopram (CELEXA) 40 MG tablet Take 40 mg by mouth daily.  12/30/12  Yes Historical Provider, MD  clonazePAM (KLONOPIN) 0.5 MG tablet Take 0.5 mg by mouth at bedtime.  10/17/12  Yes Historical Provider, MD  COLCRYS 0.6 MG tablet Take 0.6 mg by mouth daily as needed (for gout).  02/20/13  Yes Historical Provider, MD  furosemide (LASIX) 80 MG tablet Take 40 mg by mouth daily.  10/07/12  Yes Historical  Provider, MD  HYDROcodone-acetaminophen (NORCO/VICODIN) 5-325 MG per tablet Take 1 tablet by mouth every 6 (six) hours as needed. For pain 12/26/13  Yes Historical Provider, MD  LIPITOR 40 MG tablet Take 40 mg by mouth daily.  12/20/12  Yes Historical Provider, MD  metoprolol succinate (TOPROL-XL) 100 MG 24 hr tablet Take 100 mg by mouth daily.  12/10/12  Yes Historical Provider, MD  Multiple  Vitamins-Minerals (MULTIVITAMIN PO) Take 1 tablet by mouth daily.   Yes Historical Provider, MD  omeprazole (PRILOSEC) 40 MG capsule Take 40 mg by mouth daily.  10/14/12  Yes Historical Provider, MD  potassium chloride SA (K-DUR,KLOR-CON) 20 MEQ tablet Take 20 mEq by mouth daily.   Yes Historical Provider, MD  ramipril (ALTACE) 5 MG capsule Take 5 mg by mouth daily.  12/29/12  Yes Historical Provider, MD  zolpidem (AMBIEN) 10 MG tablet Take 10 mg by mouth at bedtime.  12/17/12  Yes Historical Provider, MD   BP 143/77  Pulse 69  Temp(Src) 98.5 F (36.9 C) (Oral)  Resp 24  Ht 6\' 1"  (1.854 m)  Wt 226 lb (102.513 kg)  BMI 29.82 kg/m2  SpO2 99% Physical Exam  Constitutional: He is oriented to person, place, and time. He appears well-developed and well-nourished.  HENT:  Head: Normocephalic and atraumatic.  Eyes: EOM are normal. Pupils are equal, round, and reactive to light.  Neck: Normal range of motion. Neck supple. No JVD present.  Cardiovascular: Normal rate and regular rhythm.  Exam reveals no gallop and no friction rub.   No murmur heard. Pulmonary/Chest: No respiratory distress. He has no wheezes.  Abdominal: He exhibits no distension. There is no rebound and no guarding.  Musculoskeletal: Normal range of motion.  Neurological: He is alert and oriented to person, place, and time. Gait abnormal. He displays no Babinski's sign on the right side. He displays no Babinski's sign on the left side.  Reflex Scores:      Tricep reflexes are 2+ on the right side and 2+ on the left side.      Bicep reflexes are 2+ on the right side and 2+ on the left side.      Brachioradialis reflexes are 2+ on the right side and 2+ on the left side.      Patellar reflexes are 2+ on the right side and 2+ on the left side.      Achilles reflexes are 2+ on the right side and 2+ on the left side. R sided facial droop, unchanged from hx of bells palsy.  Left axillary nerve palsy, decreased strength 4/5 of LUE. Gait  shuffling, falling to the left.  Truncal ataxia.     Skin: No rash noted. No pallor.  Psychiatric: He has a normal mood and affect. His behavior is normal.    ED Course  Procedures (including critical care time) Labs Review Labs Reviewed  CBC - Abnormal; Notable for the following:    WBC 12.9 (*)    RBC 3.89 (*)    Hemoglobin 11.7 (*)    HCT 36.6 (*)    All other components within normal limits  DIFFERENTIAL - Abnormal; Notable for the following:    Neutro Abs 8.6 (*)    Monocytes Absolute 1.3 (*)    All other components within normal limits  COMPREHENSIVE METABOLIC PANEL - Abnormal; Notable for the following:    BUN 28 (*)    Creatinine, Ser 2.18 (*)    Albumin 3.1 (*)    Alkaline Phosphatase  122 (*)    GFR calc non Af Amer 30 (*)    GFR calc Af Amer 35 (*)    All other components within normal limits  URINE RAPID DRUG SCREEN (HOSP PERFORMED) - Abnormal; Notable for the following:    Opiates POSITIVE (*)    All other components within normal limits  I-STAT CHEM 8, ED - Abnormal; Notable for the following:    BUN 27 (*)    Creatinine, Ser 2.30 (*)    Hemoglobin 12.9 (*)    HCT 38.0 (*)    All other components within normal limits  ETHANOL  PROTIME-INR  APTT  URINALYSIS, ROUTINE W REFLEX MICROSCOPIC  CBG MONITORING, ED  I-STAT TROPOININ, ED  I-STAT TROPOININ, ED    Imaging Review Ct Head Wo Contrast  12/29/2013   CLINICAL DATA:  Difficulty walking.  Altered mental status.  EXAM: CT HEAD WITHOUT CONTRAST  TECHNIQUE: Contiguous axial images were obtained from the base of the skull through the vertex without intravenous contrast.  COMPARISON:  None.  FINDINGS: A mass lesion in the right frontal lobe measures 3.3 x 4.4 cm on image 21. There is extensive surrounding vasogenic edema. There may be a second small mass in the left frontal lobe on image 22 measuring 1.2 cm in diameter. No other mass is seen. There is no midline shift or hydrocephalus. No hemorrhage is seen. The  calvarium is intact.  IMPRESSION: Large mass lesion in the right frontal lobe with a likely second mass in the left frontal lobe. Brain MRI with and without contrast is recommended for further evaluation.  Critical Value/emergent results were called by telephone at the time of interpretation on 12/29/2013 at 8:46 pm to Aarvi Stotts, P.A., who verbally acknowledged these results.   Electronically Signed   By: Inge Rise M.D.   On: 12/29/2013 20:48   Dg Chest Portable 1 View  12/29/2013   CLINICAL DATA:  Weakness.  EXAM: PORTABLE CHEST - 1 VIEW  COMPARISON:  10/08/2013  FINDINGS: The cardiac silhouette, mediastinal and hilar contours are within normal limits and stable. Slight increasingright upper lobe/ right paratracheal density. Chest CT recommended for further evaluation given the patient's history of smoking. Stable calcified granuloma in the left lung. No pleural effusions or pneumothorax. The bony thorax is intact.  IMPRESSION: Increasing right apical density. Recommend chest CT for further evaluation.   Electronically Signed   By: Kalman Jewels M.D.   On: 12/29/2013 20:25     EKG Interpretation None      MDM   Final diagnoses:  Vasogenic cerebral edema  Brain mass  Acute renal failure, unspecified acute renal failure type  Benign essential HTN  Hx of renal cell cancer  Hyperlipidemia  OSA on CPAP    Patient is a 68 y.o. male who presents with change in mentation, confusion, difficulty walking.  This started about two months ago, worsening past two weeks.    Patient found to have multiple intracranial lesions. Concern for metastasis.  Will admit to hospitalist.   Deno Etienne, MD 12/29/13 2308

## 2013-12-29 NOTE — H&P (Addendum)
[facility] Admission History and Physical       Edward [name] RJJ:[id number] DOB: 1945-07-27 DOA: 12/29/2013  Referring physician: EDP PCP: [name], MD  Specialists:   Chief Complaint: Difficulty Walking  HPI: Edward [name] is a 68 y.o. male with a history of Renal Cell Carcinoma S/P left Nephrectomy who presents to the ED with complaints of increased falls and difficulty walking over the past 2 weeks, and hesitance of speech. He reports having headaches but denies visual changes, but  He does report that he did have diplopia 1 year ago.  He was evaluated in the ED and a Ct Scan was performed which revealed 2 Brain Masses, one measuring 3.3 cm x 4.4 cm with associated vasogenic edema, and the second mass at 1.2 cm diameter.    A RUL and Paratracheal mass was also seen on his Chest X-ray.  He was referred for medical admission.      Review of Systems:  Constitutional: No Weight Loss, No Weight Gain, Night Sweats, Fevers, Chills, Dizziness, Fatigue, or Generalized Weakness HEENT: +Headaches, Difficulty Swallowing,Tooth/Dental Problems,Sore Throat,  No Sneezing, Rhinitis, Ear Ache, Nasal Congestion, or Post Nasal Drip,  Cardio-vascular:  No Chest pain, Orthopnea, PND, Edema in Lower Extremities, Anasarca, Dizziness, Palpitations  Resp: No Dyspnea, No DOE, No Cough, No Hemoptysis, No Wheezing.    GI: No Heartburn, Indigestion, Abdominal Pain, Nausea, Vomiting, Diarrhea, Hematemesis, Hematochezia, Melena, Change in Bowel Habits,  Loss of Appetite  GU: No Dysuria, Change in Color of Urine, No Urgency or Frequency, No Flank pain.  Musculoskeletal: No Joint Pain or Swelling, No Decreased Range of Motion, No Back Pain.  Neurologic: No Syncope, No Seizures, Muscle Weakness, Paresthesia, Vision Disturbance or Loss, No Diplopia, No Vertigo,+Difficulty Walking,  Skin: No Rash or Lesions. Psych: No Change in Mood or Affect, No Depression or Anxiety, No Memory loss, No  Confusion, or Hallucinations   Past Medical History  Diagnosis Date  . HTN (hypertension)   . Hypercholesteremia   . Depression   . Cancer     Renal  . H/O diplopia 01/10/2013  . OSA on CPAP 04/08/2013    Past Surgical History  Procedure Laterality Date  . Kidney surgery  2009  . Penile prosthesis implant  2011      Prior to Admission medications   Medication Sig Start Date End Date Taking? Authorizing Provider  allopurinol (ZYLOPRIM) 100 MG tablet Take 100 mg by mouth daily.  11/15/12  Yes Historical Provider, MD  buPROPion (WELLBUTRIN XL) 150 MG 24 hr tablet Take 150 mg by mouth daily.  01/07/13  Yes Historical Provider, MD  buPROPion (WELLBUTRIN XL) 300 MG 24 hr tablet Take 300 mg by mouth daily.   Yes Historical Provider, MD  citalopram (CELEXA) 40 MG tablet Take 40 mg by mouth daily.  12/30/12  Yes Historical Provider, MD  clonazePAM (KLONOPIN) 0.5 MG tablet Take 0.5 mg by mouth at bedtime.  10/17/12  Yes Historical Provider, MD  COLCRYS 0.6 MG tablet Take 0.6 mg by mouth daily as needed (for gout).  02/20/13  Yes Historical Provider, MD  furosemide (LASIX) 80 MG tablet Take 40 mg by mouth daily.  10/07/12  Yes Historical Provider, MD  HYDROcodone-acetaminophen (NORCO/VICODIN) 5-325 MG per tablet Take 1 tablet by mouth every 6 (six) hours as needed. For pain 12/26/13  Yes Historical Provider, MD  LIPITOR 40 MG tablet Take 40 mg by mouth daily.  12/20/12  Yes Historical Provider, MD  metoprolol succinate (TOPROL-XL) 100 MG  24 hr tablet Take 100 mg by mouth daily.  12/10/12  Yes Historical Provider, MD  Multiple Vitamins-Minerals (MULTIVITAMIN PO) Take 1 tablet by mouth daily.   Yes Historical Provider, MD  omeprazole (PRILOSEC) 40 MG capsule Take 40 mg by mouth daily.  10/14/12  Yes Historical Provider, MD  potassium chloride SA (K-DUR,KLOR-CON) 20 MEQ tablet Take 20 mEq by mouth daily.   Yes Historical Provider, MD  ramipril (ALTACE) 5 MG capsule Take 5 mg by mouth daily.  12/29/12  Yes  Historical Provider, MD  zolpidem (AMBIEN) 10 MG tablet Take 10 mg by mouth at bedtime.  12/17/12  Yes Historical Provider, MD      Allergies  Allergen Reactions  . Nsaids Other (See Comments)    Not good for kidney     Social History:  reports that he has been smoking Cigarettes.  He has been smoking about 1.00 pack per day. He does not have any smokeless tobacco history on file. He reports that he does not drink alcohol or use illicit drugs.     Family History  Problem Relation Age of Onset  . Cancer Mother   . Heart failure Father        Physical Exam:  GEN:  Pleasant Well Nourished and Well Developed  68 y.o. Caucasian male examined and in no acute distress; cooperative with exam Filed Vitals:   12/29/13 1852 12/29/13 2029  BP: 139/73 134/67  Pulse: 69 66  Temp: 98.5 F (36.9 C)   TempSrc: Oral   Resp: 18 21  Height: 6\' 1"  (1.854 m)   Weight: 102.513 kg (226 lb)   SpO2: 98% 100%   Blood pressure 134/67, pulse 66, temperature 98.5 F (36.9 C), temperature source Oral, resp. rate 21, height 6\' 1"  (1.854 m), weight 102.513 kg (226 lb), SpO2 100.00%. PSYCH: He is alert and oriented x4; does not appear anxious does not appear depressed; affect is normal HEENT: Normocephalic and Atraumatic, Mucous membranes pink; PERRLA; EOM intact; Fundi:  Benign;  No scleral icterus, Nares: Patent, Oropharynx: Clear, Fair Dentition,    Neck:  FROM, No Cervical Lymphadenopathy nor Thyromegaly or Carotid Bruit; No JVD; Breasts:: Not examined CHEST WALL: No tenderness CHEST: Normal respiration, clear to auscultation bilaterally HEART: Regular rate and rhythm; no murmurs rubs or gallops BACK: No kyphosis or scoliosis; No CVA tenderness ABDOMEN: Positive Bowel Sounds, Soft Non-Tender; No Masses, No Organomegaly. Rectal Exam: Not done EXTREMITIES: No Cyanosis, Clubbing, or Edema; No Ulcerations. Genitalia: not examined PULSES: 2+ and symmetric SKIN: Normal hydration no rash or  ulceration CNS: Alert and Oriented x 4,  No Facial droop or Visual deficits or Speech Deficits or Memory Deficits,  No Sensory Deficits,  +Left Sided Weakness    LUE 4/5 strength, LLE 4/5 Strength,   Cerebellar Fxn Intact,  Gait:  Not Assessed Vascular: pulses palpable throughout    Labs on Admission:  Basic Metabolic Panel:  Recent Labs Lab 12/29/13 2006 12/29/13 2019  NA 138 137  K 4.6 4.4  CL 102 105  CO2 25  --   GLUCOSE 93 97  BUN 28* 27*  CREATININE 2.18* 2.30*  CALCIUM 9.1  --    Liver Function Tests:  Recent Labs Lab 12/29/13 2006  AST 19  ALT 18  ALKPHOS 122*  BILITOT 0.3  PROT 6.8  ALBUMIN 3.1*   No results found for this basename: LIPASE, AMYLASE,  in the last 168 hours No results found for this basename: AMMONIA,  in the last 168  hours CBC:  Recent Labs Lab 12/29/13 2006 12/29/13 2019  WBC 12.9*  --   NEUTROABS 8.6*  --   HGB 11.7* 12.9*  HCT 36.6* 38.0*  MCV 94.1  --   PLT 290  --    Cardiac Enzymes: No results found for this basename: CKTOTAL, CKMB, CKMBINDEX, TROPONINI,  in the last 168 hours  BNP (last 3 results) No results found for this basename: PROBNP,  in the last 8760 hours CBG: No results found for this basename: GLUCAP,  in the last 168 hours  Radiological Exams on Admission: Ct Head Wo Contrast  12/29/2013   CLINICAL DATA:  Difficulty walking.  Altered mental status.  EXAM: CT HEAD WITHOUT CONTRAST  TECHNIQUE: Contiguous axial images were obtained from the base of the skull through the vertex without intravenous contrast.  COMPARISON:  None.  FINDINGS: A mass lesion in the right frontal lobe measures 3.3 x 4.4 cm on image 21. There is extensive surrounding vasogenic edema. There may be a second small mass in the left frontal lobe on image 22 measuring 1.2 cm in diameter. No other mass is seen. There is no midline shift or hydrocephalus. No hemorrhage is seen. The calvarium is intact.  IMPRESSION: Large mass lesion in the right  frontal lobe with a likely second mass in the left frontal lobe. Brain MRI with and without contrast is recommended for further evaluation.  Critical Value/emergent results were called by telephone at the time of interpretation on 12/29/2013 at 8:46 pm to DAN FLOYD, P.A., who verbally acknowledged these results.   Electronically Signed   By: Inge Rise M.D.   On: 12/29/2013 20:48   Dg Chest Portable 1 View  12/29/2013   CLINICAL DATA:  Weakness.  EXAM: PORTABLE CHEST - 1 VIEW  COMPARISON:  10/08/2013  FINDINGS: The cardiac silhouette, mediastinal and hilar contours are within normal limits and stable. Slight increasingright upper lobe/ right paratracheal density. Chest CT recommended for further evaluation given the patient's history of smoking. Stable calcified granuloma in the left lung. No pleural effusions or pneumothorax. The bony thorax is intact.  IMPRESSION: Increasing right apical density. Recommend chest CT for further evaluation.   Electronically Signed   By: Kalman Jewels M.D.   On: 12/29/2013 20:25     EKG: Independently reviewed. Normal Sinus Rhythm at 71 No Acute S-T changes   Assessment/Plan:   68 y.o. male with  Principal Problem:   1.   Brain mass   Neuro checks   Needs tissue diagnosis and/or PET Scan   Oncology notified by EDP   Active Problems:   2.   Vasogenic cerebral edema   Started on IV Decadron 10 mg every 6 hours x 3 days    3.   Lung Mass- on CXR   CT scan of Chest ordered     4.   ARF (acute renal failure)   IVFs   Hold lasix KCL and ACE Inhibitor x   Monitor BUN/Cr     5.   Benign essential HTN   Continue Metoprolol Rx   Monitor BPs       6.  Hyperlipidemia   Continue Statin Rx     7.  Hx of renal cell cancer- S/P Left Nephrectomy   Monitor BUN/Cr     8  OSA on CPAP   CPAP qhs     9.  DVT Prophylaxis   Lovenox  10.  Tobacco   Nicotine Patch.  Code Status:   FULL CODE  Family Communication:   Wife in ED but not at  Bedside at this time Disposition Plan:     Inpatient  Time spent:  [place] [facility] Pager [phone]   If [place] Please Contact the Day Rounding Team MD for [facility]  If 7PM-7AM, Please Contact night-coverage  [email or web address] Password [facility] 12/29/2013, 9:41 PM

## 2013-12-29 NOTE — ED Provider Notes (Signed)
I saw and evaluated the patient, reviewed the resident's note and I agree with the findings and plan.   EKG Interpretation None     Patient with difficulty walking and some other neurologic deficits. Has had some frontal release symptoms such as buying a car without discussing with his wife. Head MRI a year ago but was unable to get followup with contrast for suspicious lesion. New lesions on CT. Likely metastatic disease. His history of renal cell carcinoma and is a smoker with possible lung findings. We'll give IV steroids. Will admit for observation. Discussed with Dr. Juliann Mule, from oncology. Will likely need outpatient PET scan. Will need to get a tissue diagnosis. Family  requested Dr. Vertell Limber if neurosurg is needed  Edward [name]. Alvino Chapel, MD 12/29/13 [phone]

## 2013-12-30 ENCOUNTER — Inpatient Hospital Stay (HOSPITAL_COMMUNITY): Payer: BC Managed Care – PPO

## 2013-12-30 ENCOUNTER — Observation Stay (HOSPITAL_COMMUNITY): Payer: BC Managed Care – PPO

## 2013-12-30 DIAGNOSIS — E785 Hyperlipidemia, unspecified: Secondary | ICD-10-CM | POA: Diagnosis present

## 2013-12-30 DIAGNOSIS — R222 Localized swelling, mass and lump, trunk: Secondary | ICD-10-CM

## 2013-12-30 DIAGNOSIS — Z85828 Personal history of other malignant neoplasm of skin: Secondary | ICD-10-CM | POA: Diagnosis not present

## 2013-12-30 DIAGNOSIS — Z8249 Family history of ischemic heart disease and other diseases of the circulatory system: Secondary | ICD-10-CM | POA: Diagnosis not present

## 2013-12-30 DIAGNOSIS — C711 Malignant neoplasm of frontal lobe: Secondary | ICD-10-CM | POA: Diagnosis present

## 2013-12-30 DIAGNOSIS — F411 Generalized anxiety disorder: Secondary | ICD-10-CM | POA: Diagnosis present

## 2013-12-30 DIAGNOSIS — E7800 Pure hypercholesterolemia, unspecified: Secondary | ICD-10-CM | POA: Diagnosis present

## 2013-12-30 DIAGNOSIS — G510 Bell's palsy: Secondary | ICD-10-CM | POA: Diagnosis present

## 2013-12-30 DIAGNOSIS — T380X5A Adverse effect of glucocorticoids and synthetic analogues, initial encounter: Secondary | ICD-10-CM | POA: Diagnosis not present

## 2013-12-30 DIAGNOSIS — N183 Chronic kidney disease, stage 3 unspecified: Secondary | ICD-10-CM | POA: Diagnosis present

## 2013-12-30 DIAGNOSIS — Z6829 Body mass index (BMI) 29.0-29.9, adult: Secondary | ICD-10-CM | POA: Diagnosis not present

## 2013-12-30 DIAGNOSIS — G936 Cerebral edema: Secondary | ICD-10-CM | POA: Diagnosis present

## 2013-12-30 DIAGNOSIS — F17200 Nicotine dependence, unspecified, uncomplicated: Secondary | ICD-10-CM | POA: Diagnosis present

## 2013-12-30 DIAGNOSIS — D72829 Elevated white blood cell count, unspecified: Secondary | ICD-10-CM | POA: Diagnosis not present

## 2013-12-30 DIAGNOSIS — Z905 Acquired absence of kidney: Secondary | ICD-10-CM | POA: Diagnosis not present

## 2013-12-30 DIAGNOSIS — R5381 Other malaise: Secondary | ICD-10-CM | POA: Diagnosis present

## 2013-12-30 DIAGNOSIS — N189 Chronic kidney disease, unspecified: Secondary | ICD-10-CM

## 2013-12-30 DIAGNOSIS — N179 Acute kidney failure, unspecified: Secondary | ICD-10-CM | POA: Diagnosis present

## 2013-12-30 DIAGNOSIS — Z886 Allergy status to analgesic agent status: Secondary | ICD-10-CM | POA: Diagnosis not present

## 2013-12-30 DIAGNOSIS — Z85528 Personal history of other malignant neoplasm of kidney: Secondary | ICD-10-CM | POA: Diagnosis not present

## 2013-12-30 DIAGNOSIS — F3289 Other specified depressive episodes: Secondary | ICD-10-CM | POA: Diagnosis present

## 2013-12-30 DIAGNOSIS — G4733 Obstructive sleep apnea (adult) (pediatric): Secondary | ICD-10-CM | POA: Diagnosis present

## 2013-12-30 DIAGNOSIS — F329 Major depressive disorder, single episode, unspecified: Secondary | ICD-10-CM | POA: Diagnosis present

## 2013-12-30 DIAGNOSIS — R918 Other nonspecific abnormal finding of lung field: Secondary | ICD-10-CM | POA: Diagnosis present

## 2013-12-30 LAB — CBC
HCT: 35.6 % — ABNORMAL LOW (ref 39.0–52.0)
HEMOGLOBIN: 11.4 g/dL — AB (ref 13.0–17.0)
MCH: 30.2 pg (ref 26.0–34.0)
MCHC: 32 g/dL (ref 30.0–36.0)
MCV: 94.2 fL (ref 78.0–100.0)
Platelets: 269 10*3/uL (ref 150–400)
RBC: 3.78 MIL/uL — AB (ref 4.22–5.81)
RDW: 13.4 % (ref 11.5–15.5)
WBC: 12.6 10*3/uL — AB (ref 4.0–10.5)

## 2013-12-30 LAB — BASIC METABOLIC PANEL
Anion gap: 13 (ref 5–15)
BUN: 25 mg/dL — AB (ref 6–23)
CHLORIDE: 104 meq/L (ref 96–112)
CO2: 21 mEq/L (ref 19–32)
Calcium: 8.8 mg/dL (ref 8.4–10.5)
Creatinine, Ser: 1.86 mg/dL — ABNORMAL HIGH (ref 0.50–1.35)
GFR calc Af Amer: 42 mL/min — ABNORMAL LOW (ref >90)
GFR calc non Af Amer: 36 mL/min — ABNORMAL LOW (ref >90)
GLUCOSE: 128 mg/dL — AB (ref 70–99)
POTASSIUM: 4.7 meq/L (ref 3.7–5.3)
SODIUM: 138 meq/L (ref 137–147)

## 2013-12-30 LAB — LACTATE DEHYDROGENASE: LDH: 300 U/L — ABNORMAL HIGH (ref 94–250)

## 2013-12-30 MED ORDER — IOHEXOL 300 MG/ML  SOLN
50.0000 mL | Freq: Once | INTRAMUSCULAR | Status: AC | PRN
  Administered 2013-12-30: 50 mL via ORAL

## 2013-12-30 MED ORDER — DEXAMETHASONE SODIUM PHOSPHATE 10 MG/ML IJ SOLN
10.0000 mg | Freq: Three times a day (TID) | INTRAMUSCULAR | Status: DC
  Administered 2013-12-30 – 2014-01-02 (×7): 10 mg via INTRAVENOUS
  Filled 2013-12-30 (×10): qty 1

## 2013-12-30 MED ORDER — GADOBENATE DIMEGLUMINE 529 MG/ML IV SOLN
10.0000 mL | Freq: Once | INTRAVENOUS | Status: AC | PRN
  Administered 2013-12-30: 10 mL via INTRAVENOUS

## 2013-12-30 MED ORDER — DEXAMETHASONE SODIUM PHOSPHATE 10 MG/ML IJ SOLN: 10.0000 mg | Freq: Two times a day (BID) | INTRAMUSCULAR | Status: DC

## 2013-12-30 MED ORDER — NICOTINE 7 MG/24HR TD PT24: 7.0000 mg | MEDICATED_PATCH | Freq: Every day | TRANSDERMAL | Status: DC

## 2013-12-30 MED ORDER — PHENYTOIN SODIUM EXTENDED 100 MG PO CAPS
400.0000 mg | ORAL_CAPSULE | Freq: Once | ORAL | Status: AC
  Administered 2013-12-30: 400 mg via ORAL
  Filled 2013-12-30: qty 4

## 2013-12-30 MED ORDER — NICOTINE 7 MG/24HR TD PT24
7.0000 mg | MEDICATED_PATCH | Freq: Every day | TRANSDERMAL | Status: DC
  Administered 2013-12-30 – 2014-01-05 (×6): 7 mg via TRANSDERMAL
  Filled 2013-12-30 (×9): qty 1

## 2013-12-30 MED ORDER — PHENYTOIN SODIUM EXTENDED 100 MG PO CAPS
300.0000 mg | ORAL_CAPSULE | Freq: Once | ORAL | Status: AC
  Administered 2013-12-30: 300 mg via ORAL
  Filled 2013-12-30: qty 3

## 2013-12-30 MED ORDER — PHENYTOIN SODIUM EXTENDED 100 MG PO CAPS
100.0000 mg | ORAL_CAPSULE | Freq: Three times a day (TID) | ORAL | Status: DC
  Administered 2013-12-31 – 2014-01-01 (×2): 100 mg via ORAL
  Filled 2013-12-30 (×3): qty 1

## 2013-12-30 MED ORDER — PHENYTOIN SODIUM EXTENDED 100 MG PO CAPS
300.0000 mg | ORAL_CAPSULE | Freq: Once | ORAL | Status: AC
  Administered 2013-12-31: 300 mg via ORAL
  Filled 2013-12-30: qty 3

## 2013-12-30 NOTE — Consult Note (Signed)
Reason for Consult: Right frontal brain tumor Referring Physician: Dr. Serita [name] is an 68 y.o. male.  HPI: The patient is a 68 year old white male who has a history of renal cell carcinoma as well as scalp basal cell carcinoma. The patient presented with some difficulty walking and mental status changes. He was worked up with a head CT which demonstrated a right frontal brain lesion and a possible second smaller left frontal lesion. A neurosurgical consultation was requested. In the meantime the patient has undergone a CT of the chest, abdomen, and pelvis which did not demonstrate any other lesions.  Presently the patient is accompanied by his wife and daughter.  Past Medical History  Diagnosis Date  . HTN (hypertension)   . Hypercholesteremia   . Depression   . Cancer     Renal  . H/O diplopia 01/10/2013  . OSA on CPAP 04/08/2013    Past Surgical History  Procedure Laterality Date  . Kidney surgery  2009  . Penile prosthesis implant  2011    Family History  Problem Relation Age of Onset  . Cancer Mother   . Heart failure Father     Social History:  reports that he has been smoking Cigarettes.  He has been smoking about 1.00 pack per day. He does not have any smokeless tobacco history on file. He reports that he does not drink alcohol or use illicit drugs.  Allergies:  Allergies  Allergen Reactions  . Nsaids Other (See Comments)    Not good for kidney    Medications:  I have reviewed the patient's current medications. Prior to Admission:  Prescriptions prior to admission  Medication Sig Dispense Refill  . allopurinol (ZYLOPRIM) 100 MG tablet Take 100 mg by mouth daily.       Marland Kitchen buPROPion (WELLBUTRIN XL) 150 MG 24 hr tablet Take 150 mg by mouth daily.       Marland Kitchen buPROPion (WELLBUTRIN XL) 300 MG 24 hr tablet Take 300 mg by mouth daily.      . citalopram (CELEXA) 40 MG tablet Take 40 mg by mouth daily.       . clonazePAM (KLONOPIN) 0.5 MG tablet Take 0.5  mg by mouth at bedtime.       Marland Kitchen COLCRYS 0.6 MG tablet Take 0.6 mg by mouth daily as needed (for gout).       . furosemide (LASIX) 80 MG tablet Take 40 mg by mouth daily.       Marland Kitchen HYDROcodone-acetaminophen (NORCO/VICODIN) 5-325 MG per tablet Take 1 tablet by mouth every 6 (six) hours as needed. For pain      . LIPITOR 40 MG tablet Take 40 mg by mouth daily.       . metoprolol succinate (TOPROL-XL) 100 MG 24 hr tablet Take 100 mg by mouth daily.       . Multiple Vitamins-Minerals (MULTIVITAMIN PO) Take 1 tablet by mouth daily.      Marland Kitchen omeprazole (PRILOSEC) 40 MG capsule Take 40 mg by mouth daily.       . potassium chloride SA (K-DUR,KLOR-CON) 20 MEQ tablet Take 20 mEq by mouth daily.      . ramipril (ALTACE) 5 MG capsule Take 5 mg by mouth daily.       Marland Kitchen zolpidem (AMBIEN) 10 MG tablet Take 10 mg by mouth at bedtime.        Scheduled: . allopurinol  100 mg Oral Daily  . atorvastatin  40 mg Oral Daily  . buPROPion  150 mg Oral Daily  . buPROPion  300 mg Oral Daily  . citalopram  40 mg Oral Daily  . clonazePAM  0.5 mg Oral QHS  . [START ON 12/31/2013] dexamethasone  10 mg Intravenous Q12H  . metoprolol succinate  100 mg Oral Daily  . nicotine  7 mg Transdermal Daily  . pantoprazole  40 mg Oral Daily  . zolpidem  5 mg Oral QHS   Continuous: . sodium chloride 75 mL/hr at 12/30/13 1816   OIZ:[id number], acetaminophen, alum & mag hydroxide-simeth, HYDROmorphone (DILAUDID) injection, ondansetron (ZOFRAN) IV, ondansetron, oxyCODONE Anti-infectives   None       Results for orders placed during the hospital encounter of 12/29/13 (from the past 48 hour(s))  ETHANOL     Status: None   Collection Time    12/29/13  8:06 PM      Result Value Ref Range   Alcohol, Ethyl (B) <11  0 - 11 mg/dL   Comment:            LOWEST DETECTABLE LIMIT FOR     SERUM ALCOHOL IS 11 mg/dL     FOR MEDICAL PURPOSES ONLY  PROTIME-INR     Status: None   Collection Time    12/29/13  8:06 PM      Result Value Ref  Range   Prothrombin Time 14.0  11.6 - 15.2 seconds   INR 1.08  0.00 - 1.49  APTT     Status: None   Collection Time    12/29/13  8:06 PM      Result Value Ref Range   aPTT 31  24 - 37 seconds  CBC     Status: Abnormal   Collection Time    12/29/13  8:06 PM      Result Value Ref Range   WBC 12.9 (*) 4.0 - 10.5 K/uL   RBC 3.89 (*) 4.22 - 5.81 MIL/uL   Hemoglobin 11.7 (*) 13.0 - 17.0 g/dL   HCT 36.6 (*) 39.0 - 52.0 %   MCV 94.1  78.0 - 100.0 fL   MCH 30.1  26.0 - 34.0 pg   MCHC 32.0  30.0 - 36.0 g/dL   RDW 13.3  11.5 - 15.5 %   Platelets 290  150 - 400 K/uL  DIFFERENTIAL     Status: Abnormal   Collection Time    12/29/13  8:06 PM      Result Value Ref Range   Neutrophils Relative % 67  43 - 77 %   Neutro Abs 8.6 (*) 1.7 - 7.7 K/uL   Lymphocytes Relative 21  12 - 46 %   Lymphs Abs 2.7  0.7 - 4.0 K/uL   Monocytes Relative 10  3 - 12 %   Monocytes Absolute 1.3 (*) 0.1 - 1.0 K/uL   Eosinophils Relative 2  0 - 5 %   Eosinophils Absolute 0.3  0.0 - 0.7 K/uL   Basophils Relative 0  0 - 1 %   Basophils Absolute 0.1  0.0 - 0.1 K/uL  COMPREHENSIVE METABOLIC PANEL     Status: Abnormal   Collection Time    12/29/13  8:06 PM      Result Value Ref Range   Sodium 138  137 - 147 mEq/L   Potassium 4.6  3.7 - 5.3 mEq/L   Chloride 102  96 - 112 mEq/L   CO2 25  19 - 32 mEq/L   Glucose, Bld 93  70 - 99 mg/dL   BUN 28 (*)  6 - 23 mg/dL   Creatinine, Ser 2.18 (*) 0.50 - 1.35 mg/dL   Calcium 9.1  8.4 - 10.5 mg/dL   Total Protein 6.8  6.0 - 8.3 g/dL   Albumin 3.1 (*) 3.5 - 5.2 g/dL   AST 19  0 - 37 U/L   ALT 18  0 - 53 U/L   Alkaline Phosphatase 122 (*) 39 - 117 U/L   Total Bilirubin 0.3  0.3 - 1.2 mg/dL   GFR calc non Af Amer 30 (*) >90 mL/min   GFR calc Af Amer 35 (*) >90 mL/min   Comment: (NOTE)     The eGFR has been calculated using the CKD EPI equation.     This calculation has not been validated in all clinical situations.     eGFR's persistently <90 mL/min signify possible Chronic  Kidney     Disease.   Anion gap 11  5 - 15  I-STAT TROPOININ, ED     Status: None   Collection Time    12/29/13  8:17 PM      Result Value Ref Range   Troponin i, poc 0.00  0.00 - 0.08 ng/mL   Comment 3            Comment: Due to the release kinetics of cTnI,     a negative result within the first hours     of the onset of symptoms does not rule out     myocardial infarction with certainty.     If myocardial infarction is still suspected,     repeat the test at appropriate intervals.  I-STAT CHEM 8, ED     Status: Abnormal   Collection Time    12/29/13  8:19 PM      Result Value Ref Range   Sodium 137  137 - 147 mEq/L   Potassium 4.4  3.7 - 5.3 mEq/L   Chloride 105  96 - 112 mEq/L   BUN 27 (*) 6 - 23 mg/dL   Creatinine, Ser 2.30 (*) 0.50 - 1.35 mg/dL   Glucose, Bld 97  70 - 99 mg/dL   Calcium, Ion 1.14  1.13 - 1.30 mmol/L   TCO2 25  0 - 100 mmol/L   Hemoglobin 12.9 (*) 13.0 - 17.0 g/dL   HCT 38.0 (*) 39.0 - 52.0 %  CBG MONITORING, ED     Status: None   Collection Time    12/29/13  9:13 PM      Result Value Ref Range   Glucose-Capillary 90  70 - 99 mg/dL   Comment 1 Notify RN     Comment 2 Documented in Chart    URINE RAPID DRUG SCREEN (HOSP PERFORMED)     Status: Abnormal   Collection Time    12/29/13  9:14 PM      Result Value Ref Range   Opiates POSITIVE (*) NONE DETECTED   Cocaine NONE DETECTED  NONE DETECTED   Benzodiazepines NONE DETECTED  NONE DETECTED   Amphetamines NONE DETECTED  NONE DETECTED   Tetrahydrocannabinol NONE DETECTED  NONE DETECTED   Barbiturates NONE DETECTED  NONE DETECTED   Comment:            DRUG SCREEN FOR MEDICAL PURPOSES     ONLY.  IF CONFIRMATION IS NEEDED     FOR ANY PURPOSE, NOTIFY LAB     WITHIN 5 DAYS.                LOWEST DETECTABLE LIMITS  FOR URINE DRUG SCREEN     Drug Class       Cutoff (ng/mL)     Amphetamine      1000     Barbiturate      200     Benzodiazepine   409     Tricyclics       811     Opiates           300     Cocaine          300     THC              50  URINALYSIS, ROUTINE W REFLEX MICROSCOPIC     Status: None   Collection Time    12/29/13  9:14 PM      Result Value Ref Range   Color, Urine YELLOW  YELLOW   APPearance CLEAR  CLEAR   Specific Gravity, Urine 1.019  1.005 - 1.030   pH 6.0  5.0 - 8.0   Glucose, UA NEGATIVE  NEGATIVE mg/dL   Hgb urine dipstick NEGATIVE  NEGATIVE   Bilirubin Urine NEGATIVE  NEGATIVE   Ketones, ur NEGATIVE  NEGATIVE mg/dL   Protein, ur NEGATIVE  NEGATIVE mg/dL   Urobilinogen, UA 1.0  0.0 - 1.0 mg/dL   Nitrite NEGATIVE  NEGATIVE   Leukocytes, UA NEGATIVE  NEGATIVE   Comment: MICROSCOPIC NOT DONE ON URINES WITH NEGATIVE PROTEIN, BLOOD, LEUKOCYTES, NITRITE, OR GLUCOSE <1000 mg/dL.  BASIC METABOLIC PANEL     Status: Abnormal   Collection Time    12/30/13  4:45 AM      Result Value Ref Range   Sodium 138  137 - 147 mEq/L   Potassium 4.7  3.7 - 5.3 mEq/L   Chloride 104  96 - 112 mEq/L   CO2 21  19 - 32 mEq/L   Glucose, Bld 128 (*) 70 - 99 mg/dL   BUN 25 (*) 6 - 23 mg/dL   Creatinine, Ser 1.86 (*) 0.50 - 1.35 mg/dL   Calcium 8.8  8.4 - 10.5 mg/dL   GFR calc non Af Amer 36 (*) >90 mL/min   GFR calc Af Amer 42 (*) >90 mL/min   Comment: (NOTE)     The eGFR has been calculated using the CKD EPI equation.     This calculation has not been validated in all clinical situations.     eGFR's persistently <90 mL/min signify possible Chronic Kidney     Disease.   Anion gap 13  5 - 15  CBC     Status: Abnormal   Collection Time    12/30/13  4:45 AM      Result Value Ref Range   WBC 12.6 (*) 4.0 - 10.5 K/uL   RBC 3.78 (*) 4.22 - 5.81 MIL/uL   Hemoglobin 11.4 (*) 13.0 - 17.0 g/dL   HCT 35.6 (*) 39.0 - 52.0 %   MCV 94.2  78.0 - 100.0 fL   MCH 30.2  26.0 - 34.0 pg   MCHC 32.0  30.0 - 36.0 g/dL   RDW 13.4  11.5 - 15.5 %   Platelets 269  150 - 400 K/uL  LACTATE DEHYDROGENASE     Status: Abnormal   Collection Time    12/30/13 11:45 AM      Result Value Ref  Range   LDH 300 (*) 94 - 250 U/L   Comment: HEMOLYSIS AT THIS LEVEL MAY AFFECT RESULT    Ct Abdomen Pelvis Wo  Contrast  12/30/2013   CLINICAL DATA:  Lung mass seen on chest x-ray. Staging. Based on previous history the patient has a history of left nephrectomy for renal cell carcinoma.  EXAM: CT CHEST, ABDOMEN AND PELVIS WITHOUT CONTRAST  TECHNIQUE: Multidetector CT imaging of the chest, abdomen and pelvis was performed following the standard protocol without IV contrast.  COMPARISON:  CT of the abdomen and pelvis on 09/19/2011  FINDINGS: CT CHEST FINDINGS  Heart: Heart size is normal. No significant coronary artery calcification identified.  Vascular structures: There is atherosclerotic calcification of the thoracic aorta. No aneurysm.  Mediastinum/thyroid: The visualized portion of the thyroid gland has a normal appearance. Small calcified bilateral hilar lymph nodes are identified. No noncalcified mediastinal, hilar, or axillary adenopathy.  Lungs: No pulmonary nodules, pleural effusions, or infiltrates. Specifically, the right upper lobe/apex is unremarkable. There is a calcified granuloma in the left lower lobe. High-resolution images are unremarkable.  Chest wall/osseous structures: Mild, symmetric bilateral gynecomastia.  CT ABDOMEN AND PELVIS FINDINGS  Upper abdomen: There are numerous splenic granulomata. No focal abnormality identified within the liver, pancreas, or right kidney. The right adrenal gland has a normal appearance. The left kidney is absent. Posterior to left adrenal gland, there is an area of probable fat necrosis or other benign lesion measuring 3.7 cm and stable appearance. The gallbladder is present.  Bowel: There is a moderate hiatal hernia. Small bowel loops are normal in appearance. The appendix is well seen and has a normal appearance. There is moderate stool burden. Scattered colonic diverticular present. No acute diverticulitis.  Pelvis: Penile implant is present in the left  hemipelvis. Urinary bladder is normal in appearance.  Retroperitoneum: No retroperitoneal or mesenteric adenopathy. No evidence for aortic aneurysm.  Abdominal wall: Small periumbilical hernia contains mesenteric fat.  Osseous structures: Degenerative changes are seen at L5-S1.  IMPRESSION: 1. No evidence for lung mass. 2. Changes consistent prior granulomatous disease. 3. Mild bilateral gynecomastia. 4. Stable changes following left nephrectomy. 5. Moderate hiatal hernia. 6. Normal appendix. 7. Moderate stool burden. 8. Colonic diverticulosis. 9. Small periumbilical hernia contains only mesenteric fat.   Electronically Signed   By: Shon Hale M.[name].   On: 12/30/2013 17:46   Ct Head Wo Contrast  12/29/2013   CLINICAL DATA:  Difficulty walking.  Altered mental status.  EXAM: CT HEAD WITHOUT CONTRAST  TECHNIQUE: Contiguous axial images were obtained from the base of the skull through the vertex without intravenous contrast.  COMPARISON:  None.  FINDINGS: A mass lesion in the right frontal lobe measures 3.3 x 4.4 cm on image 21. There is extensive surrounding vasogenic edema. There may be a second small mass in the left frontal lobe on image 22 measuring 1.2 cm in diameter. No other mass is seen. There is no midline shift or hydrocephalus. No hemorrhage is seen. The calvarium is intact.  IMPRESSION: Large mass lesion in the right frontal lobe with a likely second mass in the left frontal lobe. Brain MRI with and without contrast is recommended for further evaluation.  Critical Value/emergent results were called by telephone at the time of interpretation on 12/29/2013 at 8:46 pm to DAN FLOYD, P.A., who verbally acknowledged these results.   Electronically Signed   By: Inge Rise M.[name].   On: 12/29/2013 20:48   Ct Chest Wo Contrast  12/30/2013   CLINICAL DATA:  Lung mass seen on chest x-ray. Staging. Based on previous history the patient has a history of left nephrectomy for renal cell carcinoma.  EXAM: CT CHEST,  ABDOMEN AND PELVIS WITHOUT CONTRAST  TECHNIQUE: Multidetector CT imaging of the chest, abdomen and pelvis was performed following the standard protocol without IV contrast.  COMPARISON:  CT of the abdomen and pelvis on 09/19/2011  FINDINGS: CT CHEST FINDINGS  Heart: Heart size is normal. No significant coronary artery calcification identified.  Vascular structures: There is atherosclerotic calcification of the thoracic aorta. No aneurysm.  Mediastinum/thyroid: The visualized portion of the thyroid gland has a normal appearance. Small calcified bilateral hilar lymph nodes are identified. No noncalcified mediastinal, hilar, or axillary adenopathy.  Lungs: No pulmonary nodules, pleural effusions, or infiltrates. Specifically, the right upper lobe/apex is unremarkable. There is a calcified granuloma in the left lower lobe. High-resolution images are unremarkable.  Chest wall/osseous structures: Mild, symmetric bilateral gynecomastia.  CT ABDOMEN AND PELVIS FINDINGS  Upper abdomen: There are numerous splenic granulomata. No focal abnormality identified within the liver, pancreas, or right kidney. The right adrenal gland has a normal appearance. The left kidney is absent. Posterior to left adrenal gland, there is an area of probable fat necrosis or other benign lesion measuring 3.7 cm and stable appearance. The gallbladder is present.  Bowel: There is a moderate hiatal hernia. Small bowel loops are normal in appearance. The appendix is well seen and has a normal appearance. There is moderate stool burden. Scattered colonic diverticular present. No acute diverticulitis.  Pelvis: Penile implant is present in the left hemipelvis. Urinary bladder is normal in appearance.  Retroperitoneum: No retroperitoneal or mesenteric adenopathy. No evidence for aortic aneurysm.  Abdominal wall: Small periumbilical hernia contains mesenteric fat.  Osseous structures: Degenerative changes are seen at L5-S1.  IMPRESSION: 1. No evidence for  lung mass. 2. Changes consistent prior granulomatous disease. 3. Mild bilateral gynecomastia. 4. Stable changes following left nephrectomy. 5. Moderate hiatal hernia. 6. Normal appendix. 7. Moderate stool burden. 8. Colonic diverticulosis. 9. Small periumbilical hernia contains only mesenteric fat.   Electronically Signed   By: Shon Hale M.[name].   On: 12/30/2013 17:46   Dg Chest Portable 1 View  12/29/2013   CLINICAL DATA:  Weakness.  EXAM: PORTABLE CHEST - 1 VIEW  COMPARISON:  10/08/2013  FINDINGS: The cardiac silhouette, mediastinal and hilar contours are within normal limits and stable. Slight increasingright upper lobe/ right paratracheal density. Chest CT recommended for further evaluation given the patient's history of smoking. Stable calcified granuloma in the left lung. No pleural effusions or pneumothorax. The bony thorax is intact.  IMPRESSION: Increasing right apical density. Recommend chest CT for further evaluation.   Electronically Signed   By: Kalman Jewels M.[name].   On: 12/29/2013 20:25    ROS: As above Blood pressure 141/72, pulse 61, temperature 98.1 F (36.7 C), temperature source Oral, resp. rate 16, height '6\' 1"'  (1.854 m), weight 102.513 kg (226 lb), SpO2 93.00%. Physical Exam  General: An alert and pleasant 68 year old white male in no apparent distress with a right facial palsy.  HEENT: Normocephalic, atraumatic. Pupils equal round reactive light, extraocular muscles intact  Neck: Supple  Thorax: Symmetric  Abdomen: Soft  Extremities: No obvious abnormalities  Neurologic exam: The patient is alert and oriented x3. Cranial nerves II through XII are examined bilaterally and grossly normal except he has some right hearing loss and has a partial right facial nerve palsy with a  history of Bell's palsy. The patient's motor strength is 5 over 5 his bile biceps, triceps, biceps, gastrocnemius, right hand grip he has some slight weakness in his left  hand grip at 4+ over 5.  Cerebellar exam is intact to rapid alternating movements of the upper extremities bilaterally. Sensory functions intact to light touch sensation all tested dermatomes bilaterally.  I have reviewed the patient's head CT performed at [facility] without contrast. There is a large right frontal lesion. There may be a  smaller left frontal lesion but I am not convinced.    Assessment/Plan:  right brain tumor: I have discussed situation with the patient, his wife,  his daughter, etc.. We have discussed the various types of brain lesions including primary versus metastatic lesions. 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 We have discussed the various treatment options including doing nothing,. Radiation, brain biopsy, craniotomy with resection of this lesion. I favor the latter. I described the procedure of a right frontal craniotomy with neuronavigation. We have discussed the risks of surgery including the risks of anesthesia, hemorrhage, stroke, incomplete tumor resection, recurrent tumor growth, medical risk, etc. I have answered all the patient's, and his family's, questions. They want to proceed with surgery. We will shoot for Wednesday or Thursday depending on the schedule.   Edward [name] 12/30/2013, 7:43 PM

## 2013-12-30 NOTE — Progress Notes (Signed)
Phenytoin per Pharmacy Protocol Consult  68 yr old male presented to the ED with complaints of increased falls and difficulty walking over the last 2 weeks. A CT scan revealed 2 brain masses. Pt has a history of renal cell carcinoma in 2009 at which time he had a left nephrectomy but no chemo or radiation. 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 He is expected to have neurosurgery later in the week and the MD wants the patient on phenytoin to prevent seizures.  Will give the pt an oral loading dose of 1000 mg: 400 mg initially and then 300 mg every 2 hrs x 2. 24 hrs later, will start 100 mg every 8 hrs. A level can be checked in 2-3 days and the dose can be adjusted as needed.  Arrie Senate, Pharm D

## 2013-12-30 NOTE — Progress Notes (Signed)
Patient admitted to floor from ED, patient arrived with son at bedside. Patient arrived with eyeglasses on and clothing from home in bag. Patient has flat affect and depression d/t given diagnosis and also his ex-wife having the same diagnosis as well. Patient alert and oriented X 4. Patient has partial gaze palsy with superior and inferior rectus muscles and also with superior and inferior oblique muscles in eyes, otherwise patient is neurologically intact. Patient has left IV in place. SCDs in place. Gave patient Patient Inforamtion Handbook. Gave opportunity to answer questions. Will continue to monitor accordingly.

## 2013-12-30 NOTE — Consult Note (Addendum)
[place]  Telephone:([phone]) [facility] CONSULTATION NOTE  Edward BISCHOF                                MR#: [id number]  DOB: 06-Jul-1945                       CSN#: [id number]  Referring MD: Dr. Shel[facility]  Primary MD: Dr. [name], MD    Reason for Consult: Metastatic brain lesions                                     History of renal cell carcinoma  [id number] Edward [name] is a 68 y.o. male with a history of  pT1b, pNx, pMX  renal cell carcinoma, status post left  nephrectomy in 2009, followed by Dr. Diona Fanti, never receiving therapy or radiation, admitted via ED with a 4 month history of progressive gait instability with falls, worse over the last 2 weeks, as well as some dysarthria, and forgetfulness per wife report. He reported headaches at the frontal area, without diplopia at this time, although he had been evaluated by neuro for diplopia 1 year prior which was felt to be secondary to sleep apnea. He denied any confusion, nausea or vertigo, no vomiting. He denied any seizures or loss of consciousness. He denied any bowel or urinary incontinence, but increased frequency. No cardiac or respiratory complaints.  CT Scan was performed which revealed 2 Brain Masses, one measuring 3.3 cm x 4.4 cm with associated vasogenic edema, and the second mass at 1.2 cm diameter. Of note, a prior MRI of the brain on 01/30/13 without contrast showed  multiple periventricular and subcortical foci of non-specific T2 hyperintensities. There was a larger focus in the right parasagittal frontal region (2.4x1.9cm). These findings were non-specific and considerations include autoimmune, inflammatory, post-infectious, microvascular ischemic or neoplastic etiologies were being considered, but due to chronic kidney disease history, no contrast was able to be used for further definition.   Decadron was initiated. MRI brain is pending. Of note, a CXR on admission  revealed a RUL and Paratracheal mass suspicious for malignancy. LDH is elevated at 300 CT if the chest, abdomen and pelvis were ordered to rule out metastatic disease. No tissue has been obtained for diagnosis at this time to rule out RCC versus met lung to brain versus other primary. .  We were requested to see this patient with recommendations.        PMH:  Past Medical History  Diagnosis Date  . HTN (hypertension)   . Hypercholesteremia   . Depression   . Cancer     Renal  . H/O diplopia 01/10/2013  . OSA on CPAP 04/08/2013   History of basal cell carcinoma of the left cheek  And right parietal scalp s/p excision   Surgeries:  Past Surgical History  Procedure Laterality Date  . Kidney surgery  2009  . Penile prosthesis implant  2011    Allergies:  Allergies  Allergen Reactions  . Nsaids Other (See Comments)    Not good for kidney    Medications:   Scheduled Meds: . allopurinol  100 mg Oral Daily  . atorvastatin  40 mg Oral Daily  . buPROPion  150 mg Oral Daily  . buPROPion  300 mg  Oral Daily  . citalopram  40 mg Oral Daily  . clonazePAM  0.5 mg Oral QHS  . dexamethasone  10 mg Intravenous 4 times per day  . metoprolol succinate  100 mg Oral Daily  . nicotine  7 mg Transdermal Daily  . pantoprazole  40 mg Oral Daily  . zolpidem  5 mg Oral QHS   Continuous Infusions: . sodium chloride 75 mL/hr at 12/30/13 0558   PRN Meds:.acetaminophen, acetaminophen, alum & mag hydroxide-simeth, HYDROmorphone (DILAUDID) injection, ondansetron (ZOFRAN) IV, ondansetron, oxyCODONE  ROS: Constitutional: Denies fevers, chills or abnormal night sweats Eyes: Denies blurriness of vision, double vision or watery eyes at this time, otherwise as per HPI Ears, nose, mouth, throat, and face: Denies mucositis or sore throat Respiratory: Denies cough, dyspnea or wheezes. Remote exposure to asbestos.  Cardiovascular: Denies palpitation, chest discomfort or lower extremity  swelling Gastrointestinal:  Denies nausea, heartburn or change in bowel habits Skin: Denies abnormal skin rashes Lymphatics: Denies new lymphadenopathy. He reports easy bruising Neurological:  Denies numbness, tingling or new weaknesses other than the mentioned in HPI Behavioral/Psych: Mood is stable, no new changes. He has depression All other systems were reviewed with the patient and are negative.   Family History:    Family History  Problem Relation Age of Onset  . Cancer, breast Mother   . Heart failure Father    No family history of hematological  disorders.  Social History: The patient is married. He has a grown son and daughter and a 85 year old daughter. He manages his family's finances at home. He does not have an active job away from home. He does still smoke. He does not drink, having given this up secondary to alcoholism. He lives in [place].   Physical Exam   ECOG PERFORMANCE STATUS: 2   Symptomatic, >50% in bed during the day (Ambulatory and capable of all self care but unable to carry out any work activities. Up and about more than 50% of waking hours)    Filed Vitals:   12/30/13 0915  BP: 140/70  Pulse: 73  Temp: 97.7 F (36.5 C)  Resp: 20   Filed Weights   12/29/13 1852  Weight: 226 lb (102.513 kg)    GENERAL:alert, no distress and comfortable SKIN: skin color, texture, turgor are normal, no rashes or significant lesions EYES: normal, conjunctiva are pink and non-injected, sclera clear OROPHARYNX:no exudate, no erythema and lips, buccal mucosa, and tongue normal  NECK: supple, thyroid normal size, non-tender, without nodularity LYMPH:  no palpable lymphadenopathy in the cervical, axillary or inguinal LUNGS: clear to auscultation and percussion with normal breathing effort HEART: regular rate & rhythm and no murmurs and no lower extremity edema ABDOMEN:abdomen soft, non-tender and normal bowel sounds Musculoskeletal:no cyanosis of digits and no  clubbing  PSYCH: alert & oriented x 3 with fluent speech NEURO: no focal motor/sensory deficits  CBC  Recent Labs Lab 12/29/13 2006 12/29/13 2019 12/30/13 0445  WBC 12.9*  --  12.6*  HGB 11.7* 12.9* 11.4*  HCT 36.6* 38.0* 35.6*  PLT 290  --  269  MCV 94.1  --  94.2  MCH 30.1  --  30.2  MCHC 32.0  --  32.0  RDW 13.3  --  13.4  LYMPHSABS 2.7  --   --   MONOABS 1.3*  --   --   EOSABS 0.3  --   --   BASOSABS 0.1  --   --     Anemia panel:  No results  found for this basename: VITAMINB12, FOLATE, FERRITIN, TIBC, IRON, RETICCTPCT,  in the last 72 hours  CMP    Recent Labs Lab 12/29/13 2006 12/29/13 2019 12/30/13 0445  NA 138 137 138  K 4.6 4.4 4.7  CL 102 105 104  CO2 25  --  21  GLUCOSE 93 97 128*  BUN 28* 27* 25*  CREATININE 2.18* 2.30* 1.86*  CALCIUM 9.1  --  8.8  AST 19  --   --   ALT 18  --   --   ALKPHOS 122*  --   --   BILITOT 0.3  --   --         Component Value Date/Time   BILITOT 0.3 12/29/2013 2006      Recent Labs Lab 12/29/13 2006  INR 1.08    No results found for this basename: DDIMER,  in the last 72 hours  Imaging Studies:  Ct Head Wo Contrast  12/29/2013   CLINICAL DATA:  Difficulty walking.  Altered mental status.  EXAM: CT HEAD WITHOUT CONTRAST  TECHNIQUE: Contiguous axial images were obtained from the base of the skull through the vertex without intravenous contrast.  COMPARISON:  None.  FINDINGS: A mass lesion in the right frontal lobe measures 3.3 x 4.4 cm on image 21. There is extensive surrounding vasogenic edema. There may be a second small mass in the left frontal lobe on image 22 measuring 1.2 cm in diameter. No other mass is seen. There is no midline shift or hydrocephalus. No hemorrhage is seen. The calvarium is intact.  IMPRESSION: Large mass lesion in the right frontal lobe with a likely second mass in the left frontal lobe. Brain MRI with and without contrast is recommended for further evaluation.  Critical Value/emergent  results were called by telephone at the time of interpretation on 12/29/2013 at 8:46 pm to DAN FLOYD, P.A., who verbally acknowledged these results.   Electronically Signed   By: Inge Rise M.D.   On: 12/29/2013 20:48   Dg Chest Portable 1 View  12/29/2013   CLINICAL DATA:  Weakness.  EXAM: PORTABLE CHEST - 1 VIEW  COMPARISON:  10/08/2013  FINDINGS: The cardiac silhouette, mediastinal and hilar contours are within normal limits and stable. Slight increasingright upper lobe/ right paratracheal density. Chest CT recommended for further evaluation given the patient's history of smoking. Stable calcified granuloma in the left lung. No pleural effusions or pneumothorax. The bony thorax is intact.  IMPRESSION: Increasing right apical density. Recommend chest CT for further evaluation.   Electronically Signed   By: Kalman Jewels M.D.   On: 12/29/2013 20:25    MRI brain (without) 01/30/13  FINDINGS:  No abnormal lesions are seen on diffusion-weighted views to suggest acute ischemia. The cortical sulci, fissures and cisterns are normal in size and appearance. Lateral, third and fourth ventricle are normal in size and appearance. No extra-axial fluid collections are seen. No evidence of mass effect or midline shift.  There are multiple periventricular and subcortical foci of non-specific T2 hyperintensities. There is a larger focus in the right parasagittal frontal region (2.4x1.9cm).  On sagittal views the posterior fossa, pituitary gland and corpus callosum are unremarkable. There is a 8m left frontal juxtacortical focus of gradient echo hypointensity, may reflect dystrophic calcification or hemosiderin. The orbits and their contents, paranasal sinuses and calvarium are unremarkable. Intracranial flow voids are present.  IMPRESSION:  Abnormal MRI brain (without) demonstrating:  1. There are multiple periventricular and subcortical foci of non-specific T2  hyperintensities. There is a larger focus in the  right parasagittal frontal region (2.4x1.9cm). These findings are non-specific and considerations include autoimmune, inflammatory, post-infectious, microvascular ischemic or neoplastic etiologies. Consider post-contrast imaging.  2. There is a 97m left frontal juxtacortical focus of gradient echo hypointensity, may reflect dystrophic calcification or hemosiderin.  3. No acute findings.  Result Narrative     [facility] [place] [id number] REPORT OF SURGICAL PATHOLOGY  Case #: [id number] Name: GAUL, MANGIERI Office Chart Number: N/A  MRN: [id number]: ELouanne Belton ARodney Cruise MD DOB/Age [id number](Age: [birth year]89 Gender: M Date Taken: 11/07/2007 Date Received: 11/07/2007  FINAL DIAGNOSIS  MICROSCOPIC EXAMINATION AND DIAGNOSIS  KIDNEY, LEFT, NEPHRECTOMY: - RENAL CELL CARCINOMA, CLEAR CELL TYPE (6 CM IN GREATEST DIMENSION). -Fredirick LatheNUCLEAR GRADE IV. - NO RENAL VEIN INVASION IDENTIFIED. - NO EXTRACAPSULAR EXTENSION OF TUMOR IDENTIFIED. - PERIRENAL FAT AND SOFT TISSUE MARGINS NEGATIVE FOR TUMOR. - DISTAL URETER AND RENAL VESSELS NEGATIVE FOR TUMOR.  COMMENT ONCOLOGY TABLE-KIDNEY  1. Maximum tumor size (cm): 6.0 cm 2. Location of tumor (right or left): left 3. Histology: Renal cell carcinoma 4. Fuhrman Nuclear Grade: IV 5. Margins (renal vein, ureter, soft tissue): 6. Renal vein invasion: negative 7. Extension through capsule: negative 8. Adrenal gland: Not identified 9. Lymph nodes: # examined 0;# positive N/A 10. TNM code: pT1b, pNx, pMX (EAA:mw, 11/08/07)       A/P: 68y.o. male with  1. Brain masses 2. History of Renal Cell Carcinoma Patient was diagnosed with RCC in 2009, followed by Dr. DStanford Breed Never received therapy or radiation.  He was found to have brain masses suspicious for malignancy, likely metastatic. CXR showed a right hilar mass worrisome for malignancy New CT of the chest,  abdomen and pelvis pending, to rule out metastatic disease.  He will need tissue biiopsy for diagnosis. This can be done by IR while in hospital May need radiation oncology consultation as well.   Continue nicotine patch, Tobacco cessation recommended Once this is available and results are obtained, will proceed with recommendations  2. Acute on chronic renal failure As per primary team  3. DVT prophylaxis On Lovenox  Full Code  WERTMAN,SARA E, PA-C 12/30/2013 2:07 PM   ADDENDUM:  I saw and examined the patient. I really believe that this is metastatic kidney cancer. The CT scan that he had done did not show any evidence of disease elsewhere. The chest x-ray that he had which was concerning did not show anything on the CT scan.  He needs to have the MRI done. This will give uKoreaa better idea as to the size of these lesions and whether or not he is extra metastatic foci.  I truly believe that he has recurrent renal cell carcinoma. Being that said, he will could benefit from resection of the known brain metastasis. The 2 that are seen on CT scan clearly are causing him problems. Renal cell carcinoma tends to be somewhat radioresistant so both resection would be ideal and would allow subsequent brain radiation to have a better result.  His left nephrectomy back in 2009 showed a clear cell renal cell carcinoma so this should offer him a better prognosis.  Neurosurgery has been notified. They will see him tonight. Surgical resection will provide tissue for diagnosis.   I spoke to he and his wife and son. I explained to them what I felt would be best for him. He will clearly need  radiation therapy once he has surgical resection of the symptomatic metastasis.  Even if he has multiple metastasis, I would clearly get neurosurgery to resect out the 2 metastasis that are seen on the CT scan. The one in the right frontal lobe is the one causing him the most difficulties. The other in the left side is  smaller but yet could be symptomatic.  He is on Decadron. He does have some midline shift. He does have some edema. We have to watch out for any type of seizures. I suspect that he will need some type of seizure medication as he likely will be going to surgery this week. His lab work looks okay. His LDH is elevated which could be considered a negative prognostic factor for renal cell carcinoma.  Since he does not have any obvious systemic metastasis, I do not see an obvious all for systemic chemotherapy. He will need close followup with x-rays so that if he does have systemic recurrence, these could be caught early, and resected if possible.  I spent about an hour with Mr. Roarty. He is a real by sky. He is very interesting to talk to.  He has a very good performance status so I think that aggressive intervention is indicated.  His calcium is okay. This is a positive prognostic factor for him.  I will definitely pray for he and his family.  Pete Edward.  Romans 5:3-5

## 2013-12-30 NOTE — Progress Notes (Signed)
Pt has refused use of cpap at this time.  Rt will continue to monitor.

## 2013-12-30 NOTE — Progress Notes (Signed)
[facility]                                                                              Patient Demographics  Edward [name], is a 68 y.o. male, DOB - 10-Jan-1946, OIT:[id number]  Admit date - 12/29/2013   Admitting Physician Theressa Millard, MD  Outpatient Primary MD for the patient is [name], MD  LOS - 1   Chief Complaint  Patient presents with  . Weakness      HPI on 12/29/2013 Edward [name] is a 68 y.o. male with a history of Renal Cell Carcinoma S/P left Nephrectomy who presented to the ED with complaints of increased falls and difficulty walking over the past 2 weeks, and hesitance of speech. He reported having headaches but denied visual changes, but did have diplopia 1 year ago. He was evaluated in the ED and a CT Scan was performed which revealed 2 Brain Masses, one measuring 3.3 cm x 4.4 cm with associated vasogenic edema, and the second mass at 1.2 cm diameter. A RUL and Paratracheal mass was also seen on his Chest X-ray. He was referred for medical admission.    Assessment & Plan   Brain mass with vasogenic edema -Seen on CT scan: Large mass lesion in the right frontal lobe with a likely second mass in the left frontal lobe.  -Patient noted to have some vasogenic edema and started on Decadron -Will consult oncology as well as neurosurgery -Will obtain MRI brain  Lung mass -Noted on CXR -Will consult oncology -CT chest/abd pelvis ordered -Possibly secondary to recurrent RCC?  Acute on chronic kidney failure, stage III -Patient s/p renal cell carcinoma s/p Left nephrectomy -Continue IVF  -Lasix and ACEi held -Continue to monitor BMP  History of Renal Cell carcinoma -2009 -S/p left nephrectomy -Patient did not see an oncologist at that time.  He did not receive chemotherapy or radiation.  Essential hypertension -Continue metoprolol  Hyperlipidemia -Continue statin  Obstructive sleep apnea -Continue CPAP QHS  Tobacco  Abuse -Nicotine patch -Smoking cessation counseling  Depression/anxiety -Continue Wellbutrin, Celexa, Klonopin  Code Status: Full  Family Communication: None at bedside  Disposition Plan: Admitted, pending further workup  Time Spent in minutes   30 minutes  Procedures  None  Consults   Neurosurgery Oncology  DVT Prophylaxis  Lovenox  Lab Results  Component Value Date   PLT 269 12/30/2013    Medications  Scheduled Meds: . allopurinol  100 mg Oral Daily  . atorvastatin  40 mg Oral Daily  . buPROPion  150 mg Oral Daily  . buPROPion  300 mg Oral Daily  . citalopram  40 mg Oral Daily  . clonazePAM  0.5 mg Oral QHS  . dexamethasone  10 mg Intravenous 4 times per day  . metoprolol succinate  100 mg Oral Daily  . nicotine  7 mg Transdermal Daily  . pantoprazole  40 mg Oral Daily  . zolpidem  5 mg Oral QHS   Continuous Infusions: . sodium chloride 75 mL/hr at 12/30/13 0558   PRN Meds:.acetaminophen, acetaminophen, alum & mag hydroxide-simeth, HYDROmorphone (DILAUDID) injection, ondansetron (ZOFRAN) IV, ondansetron, oxyCODONE  Antibiotics    Anti-infectives   None  Subjective:   Rhyder Koegel seen and examined today.  Patient denies pain at this time, denies vision changes or headache.  Denies chest pain, SOB, abdominal pain.   Objective:   Filed Vitals:   12/29/13 2330 12/30/13 0254 12/30/13 0555 12/30/13 0915  BP:  120/61 127/64 140/70  Pulse:  71 71 73  Temp:  98.3 F (36.8 C) 97.3 F (36.3 C) 97.7 F (36.5 C)  TempSrc:  Oral Oral Oral  Resp: 19 18 17 20   Height:      Weight:      SpO2:  96% 100% 98%    Wt Readings from Last 3 Encounters:  12/29/13 102.513 kg (226 lb)  10/08/13 105.235 kg (232 lb)  04/08/13 102.059 kg (225 lb)     Intake/Output Summary (Last 24 hours) at 12/30/13 1024 Last data filed at 12/30/13 0558  Gross per 24 hour  Intake   1280 ml  Output    450 ml  Net    830 ml    Exam  General: Well developed, well  nourished, NAD, appears stated age  HEENT: NCAT, PERRLA, EOMI, Anicteic Sclera, mucous membranes moist.   Neck: Supple, no JVD, no masses  Cardiovascular: S1 S2 auscultated, no rubs, murmurs or gallops. Regular rate and rhythm.  Respiratory: Clear to auscultation bilaterally with equal chest rise  Abdomen: Soft, nontender, nondistended, + bowel sounds  Extremities: warm dry without cyanosis clubbing or edema  Neuro: AAOx3, cranial nerves grossly intact. Strength 5/5 in patient's upper and lower extremities bilaterally  Skin: Without rashes exudates or nodules  Psych: Normal affect and demeanor with intact judgement and insight    Data Review   Micro Results No results found for this or any previous visit (from the past 240 hour(s)).  Radiology Reports Ct Head Wo Contrast  12/29/2013   CLINICAL DATA:  Difficulty walking.  Altered mental status.  EXAM: CT HEAD WITHOUT CONTRAST  TECHNIQUE: Contiguous axial images were obtained from the base of the skull through the vertex without intravenous contrast.  COMPARISON:  None.  FINDINGS: A mass lesion in the right frontal lobe measures 3.3 x 4.4 cm on image 21. There is extensive surrounding vasogenic edema. There may be a second small mass in the left frontal lobe on image 22 measuring 1.2 cm in diameter. No other mass is seen. There is no midline shift or hydrocephalus. No hemorrhage is seen. The calvarium is intact.  IMPRESSION: Large mass lesion in the right frontal lobe with a likely second mass in the left frontal lobe. Brain MRI with and without contrast is recommended for further evaluation.  Critical Value/emergent results were called by telephone at the time of interpretation on 12/29/2013 at 8:46 pm to DAN FLOYD, P.A., who verbally acknowledged these results.   Electronically Signed   By: Inge Rise M.D.   On: 12/29/2013 20:48   Dg Chest Portable 1 View  12/29/2013   CLINICAL DATA:  Weakness.  EXAM: PORTABLE CHEST - 1 VIEW   COMPARISON:  10/08/2013  FINDINGS: The cardiac silhouette, mediastinal and hilar contours are within normal limits and stable. Slight increasingright upper lobe/ right paratracheal density. Chest CT recommended for further evaluation given the patient's history of smoking. Stable calcified granuloma in the left lung. No pleural effusions or pneumothorax. The bony thorax is intact.  IMPRESSION: Increasing right apical density. Recommend chest CT for further evaluation.   Electronically Signed   By: Kalman Jewels M.D.   On: 12/29/2013 20:25    CBC  Recent Labs Lab 12/29/13 2006 12/29/13 2019 12/30/13 0445  WBC 12.9*  --  12.6*  HGB 11.7* 12.9* 11.4*  HCT 36.6* 38.0* 35.6*  PLT 290  --  269  MCV 94.1  --  94.2  MCH 30.1  --  30.2  MCHC 32.0  --  32.0  RDW 13.3  --  13.4  LYMPHSABS 2.7  --   --   MONOABS 1.3*  --   --   EOSABS 0.3  --   --   BASOSABS 0.1  --   --     Chemistries   Recent Labs Lab 12/29/13 2006 12/29/13 2019 12/30/13 0445  NA 138 137 138  K 4.6 4.4 4.7  CL 102 105 104  CO2 25  --  21  GLUCOSE 93 97 128*  BUN 28* 27* 25*  CREATININE 2.18* 2.30* 1.86*  CALCIUM 9.1  --  8.8  AST 19  --   --   ALT 18  --   --   ALKPHOS 122*  --   --   BILITOT 0.3  --   --    ------------------------------------------------------------------------------------------------------------------ estimated creatinine clearance is 49.1 ml/min (by C-G formula based on Cr of 1.86). ------------------------------------------------------------------------------------------------------------------ No results found for this basename: HGBA1C,  in the last 72 hours ------------------------------------------------------------------------------------------------------------------ No results found for this basename: CHOL, HDL, LDLCALC, TRIG, CHOLHDL, LDLDIRECT,  in the last 72  hours ------------------------------------------------------------------------------------------------------------------ No results found for this basename: TSH, T4TOTAL, FREET3, T3FREE, THYROIDAB,  in the last 72 hours ------------------------------------------------------------------------------------------------------------------ No results found for this basename: VITAMINB12, FOLATE, FERRITIN, TIBC, IRON, RETICCTPCT,  in the last 72 hours  Coagulation profile  Recent Labs Lab 12/29/13 2006  INR 1.08    No results found for this basename: DDIMER,  in the last 72 hours  Cardiac Enzymes No results found for this basename: CK, CKMB, TROPONINI, MYOGLOBIN,  in the last 168 hours ------------------------------------------------------------------------------------------------------------------ No components found with this basename: POCBNP,     Janaria Mccammon D.O. on 12/30/2013 at 10:24 AM  Between 7am to 7pm - Pager - [phone]  After 7pm go to [email or web address] - password [email or web address]  And look for the night coverage person covering for me after hours  [facility] Office  [phone]

## 2013-12-31 ENCOUNTER — Other Ambulatory Visit: Payer: Self-pay | Admitting: Neurosurgery

## 2013-12-31 NOTE — Progress Notes (Signed)
[facility]                                                                              Patient Demographics  Edward [name], is a 68 y.o. male, DOB - [birth year], TDS:[id number]  Admit date - 12/29/2013   Admitting Physician Theressa Millard, MD  Outpatient Primary MD for the patient is [name], MD  LOS - 2   Chief Complaint  Patient presents with  . Weakness      HPI on 12/29/2013 Edward [name] is a 68 y.o. male with a history of Renal Cell Carcinoma S/P left Nephrectomy who presented to the ED with complaints of increased falls and difficulty walking over the past 2 weeks, and hesitance of speech. He reported having headaches but denied visual changes, but did have diplopia 1 year ago. He was evaluated in the ED and a CT Scan was performed which revealed 2 Brain Masses, one measuring 3.3 cm x 4.4 cm with associated vasogenic edema, and the second mass at 1.2 cm diameter. A RUL and Paratracheal mass was also seen on his Chest X-ray. He was referred for medical admission.    Assessment & Plan   Brain mass with vasogenic edema -Seen on CT scan: Large mass lesion in the right frontal lobe with a likely second mass in the left frontal lobe.  -Patient noted to have some vasogenic edema, continue Decadron -MRI brain:4.0 x 3.1 x 4.1 cm enhancing mass within the right frontal lobe with associated mass effect  -Neurosurgery consulted and appreciated, planning for surgery this Wed/Thursday -CT Chest/abd/pelvis: No evidence of lung mass, consistent with granulomatous disease, stable changes following left nephrectomy -Oncology consulted and appreciated  Lung mass -Noted on CXR -However, not on CT chest, changes consistent with prior granulomatous disease  Acute on chronic kidney failure, stage III -Patient s/p renal cell carcinoma s/p Left nephrectomy -Continue IVF  -Creatinine trending downward -Lasix and ACEi held -Continue to monitor BMP  History of  Renal Cell carcinoma -2009 -S/p left nephrectomy -Patient did not see an oncologist at that time.  He did not receive chemotherapy or radiation.  Essential hypertension -Continue metoprolol  Hyperlipidemia -Continue statin  Obstructive sleep apnea -Continue CPAP QHS  Tobacco Abuse -Nicotine patch -Smoking cessation counseling  Depression/anxiety -Continue Wellbutrin, Celexa, Klonopin  Nausea/vomiting -Zofran PRN  Code Status: Full  Family Communication: None at bedside  Disposition Plan: Admitted  Time Spent in minutes   25 minutes  Procedures  None  Consults   Neurosurgery Oncology  DVT Prophylaxis  Lovenox  Lab Results  Component Value Date   PLT 269 12/30/2013    Medications  Scheduled Meds: . allopurinol  100 mg Oral Daily  . atorvastatin  40 mg Oral Daily  . buPROPion  150 mg Oral Daily  . buPROPion  300 mg Oral Daily  . citalopram  40 mg Oral Daily  . clonazePAM  0.5 mg Oral QHS  . dexamethasone  10 mg Intravenous Q8H  . metoprolol succinate  100 mg Oral Daily  . nicotine  7 mg Transdermal Daily  . pantoprazole  40 mg Oral Daily  . phenytoin  100 mg Oral 3 times per day  .  zolpidem  5 mg Oral QHS   Continuous Infusions: . sodium chloride 75 mL/hr at 12/30/13 1816   PRN Meds:.acetaminophen, acetaminophen, alum & mag hydroxide-simeth, HYDROmorphone (DILAUDID) injection, ondansetron (ZOFRAN) IV, ondansetron, oxyCODONE  Antibiotics    Anti-infectives   None      Subjective:   Edward [name] seen and examined today.  Patient complains of vomiting this morning.  He states he had started coughing after he ate and then he began to vomit.  He denies abdominal pain.    Objective:   Filed Vitals:   12/30/13 2137 12/31/13 0200 12/31/13 0600 12/31/13 1004  BP: 130/63 125/63 124/65 113/63  Pulse: 78 58 63 55  Temp: 97.9 F (36.6 C) 97.5 F (36.4 C) 98.4 F (36.9 C) 97.9 F (36.6 C)  TempSrc: Oral Oral Oral Oral  Resp: 18 18 18 18     Height:      Weight:      SpO2: 100% 99% 98% 95%    Wt Readings from Last 3 Encounters:  12/29/13 102.513 kg (226 lb)  10/08/13 105.235 kg (232 lb)  04/08/13 102.059 kg (225 lb)     Intake/Output Summary (Last 24 hours) at 12/31/13 1056 Last data filed at 12/31/13 0900  Gross per 24 hour  Intake   1020 ml  Output   1000 ml  Net     20 ml    Exam  General: Well developed, well nourished, NAD, appears stated age  HEENT: NCAT,  mucous membranes moist.   Cardiovascular: S1 S2 auscultated, no rubs, murmurs or gallops. Regular rate and rhythm.  Respiratory: Clear to auscultation bilaterally with equal chest rise  Abdomen: Soft, nontender, nondistended, + bowel sounds; vomiting  Extremities: warm dry without cyanosis clubbing or edema  Neuro: AAOx3, no focal deficits  Psych: Normal affect and demeanor with intact judgement and insight  Data Review   Micro Results No results found for this or any previous visit (from the past 240 hour(s)).  Radiology Reports Ct Head Wo Contrast  12/29/2013   CLINICAL DATA:  Difficulty walking.  Altered mental status.  EXAM: CT HEAD WITHOUT CONTRAST  TECHNIQUE: Contiguous axial images were obtained from the base of the skull through the vertex without intravenous contrast.  COMPARISON:  None.  FINDINGS: A mass lesion in the right frontal lobe measures 3.3 x 4.4 cm on image 21. There is extensive surrounding vasogenic edema. There may be a second small mass in the left frontal lobe on image 22 measuring 1.2 cm in diameter. No other mass is seen. There is no midline shift or hydrocephalus. No hemorrhage is seen. The calvarium is intact.  IMPRESSION: Large mass lesion in the right frontal lobe with a likely second mass in the left frontal lobe. Brain MRI with and without contrast is recommended for further evaluation.  Critical Value/emergent results were called by telephone at the time of interpretation on 12/29/2013 at 8:46 pm to DAN FLOYD, P.A.,  who verbally acknowledged these results.   Electronically Signed   By: Inge Rise M.D.   On: 12/29/2013 20:48   Dg Chest Portable 1 View  12/29/2013   CLINICAL DATA:  Weakness.  EXAM: PORTABLE CHEST - 1 VIEW  COMPARISON:  10/08/2013  FINDINGS: The cardiac silhouette, mediastinal and hilar contours are within normal limits and stable. Slight increasingright upper lobe/ right paratracheal density. Chest CT recommended for further evaluation given the patient's history of smoking. Stable calcified granuloma in the left lung. No pleural effusions or pneumothorax. The bony  thorax is intact.  IMPRESSION: Increasing right apical density. Recommend chest CT for further evaluation.   Electronically Signed   By: Kalman Jewels M.D.   On: 12/29/2013 20:25    CBC  Recent Labs Lab 12/29/13 2006 12/29/13 2019 12/30/13 0445  WBC 12.9*  --  12.6*  HGB 11.7* 12.9* 11.4*  HCT 36.6* 38.0* 35.6*  PLT 290  --  269  MCV 94.1  --  94.2  MCH 30.1  --  30.2  MCHC 32.0  --  32.0  RDW 13.3  --  13.4  LYMPHSABS 2.7  --   --   MONOABS 1.3*  --   --   EOSABS 0.3  --   --   BASOSABS 0.1  --   --     Chemistries   Recent Labs Lab 12/29/13 2006 12/29/13 2019 12/30/13 0445  NA 138 137 138  K 4.6 4.4 4.7  CL 102 105 104  CO2 25  --  21  GLUCOSE 93 97 128*  BUN 28* 27* 25*  CREATININE 2.18* 2.30* 1.86*  CALCIUM 9.1  --  8.8  AST 19  --   --   ALT 18  --   --   ALKPHOS 122*  --   --   BILITOT 0.3  --   --    ------------------------------------------------------------------------------------------------------------------ estimated creatinine clearance is 49.1 ml/min (by C-G formula based on Cr of 1.86). ------------------------------------------------------------------------------------------------------------------ No results found for this basename: HGBA1C,  in the last 72  hours ------------------------------------------------------------------------------------------------------------------ No results found for this basename: CHOL, HDL, LDLCALC, TRIG, CHOLHDL, LDLDIRECT,  in the last 72 hours ------------------------------------------------------------------------------------------------------------------ No results found for this basename: TSH, T4TOTAL, FREET3, T3FREE, THYROIDAB,  in the last 72 hours ------------------------------------------------------------------------------------------------------------------ No results found for this basename: VITAMINB12, FOLATE, FERRITIN, TIBC, IRON, RETICCTPCT,  in the last 72 hours  Coagulation profile  Recent Labs Lab 12/29/13 2006  INR 1.08    No results found for this basename: DDIMER,  in the last 72 hours  Cardiac Enzymes No results found for this basename: CK, CKMB, TROPONINI, MYOGLOBIN,  in the last 168 hours ------------------------------------------------------------------------------------------------------------------ No components found with this basename: POCBNP,     Edward [name] D.O. on 12/31/2013 at 10:56 AM  Between 7am to 7pm - Pager - [phone]  After 7pm go to [email or web address] - password [email or web address]  And look for the night coverage person covering for me after hours  [facility] Office  [phone]

## 2013-12-31 NOTE — Progress Notes (Signed)
Pt refuses cpap at this time. Rt will continue to monitor.

## 2013-12-31 NOTE — Progress Notes (Signed)
Patient ID: Edward [name], male   DOB: 08-02-1945, 68 y.o.   MRN: [id number] Subjective:  the patient is alert and pleasant. He has no complaints.  Objective: Vital signs in last 24 hours: Temp:  [97.5 F (36.4 C)-98.4 F (36.9 C)] 98.4 F (36.9 C) (08/18 0600) Pulse Rate:  [58-78] 63 (08/18 0600) Resp:  [16-20] 18 (08/18 0600) BP: (124-141)/(63-72) 124/65 mmHg (08/18 0600) SpO2:  [93 %-100 %] 98 % (08/18 0600)  Intake/Output from previous day: 08/17 0701 - 08/18 0700 In: 900 [I.V.:900] Out: 1000 [Urine:1000] Intake/Output this shift:    Physical exam the patient is alert and oriented x3. He is moving all 4 extremities well. He continues to have a moderate right Bell's palsy.  I reviewed the patient's brain MRI performed yesterday at [facility]. It demonstrates a single right frontal lesion.  Lab Results:  Recent Labs  12/29/13 2006 12/29/13 2019 12/30/13 0445  WBC 12.9*  --  12.6*  HGB 11.7* 12.9* 11.4*  HCT 36.6* 38.0* 35.6*  PLT 290  --  269   BMET  Recent Labs  12/29/13 2006 12/29/13 2019 12/30/13 0445  NA 138 137 138  K 4.6 4.4 4.7  CL 102 105 104  CO2 25  --  21  GLUCOSE 93 97 128*  BUN 28* 27* 25*  CREATININE 2.18* 2.30* 1.86*  CALCIUM 9.1  --  8.8    Studies/Results: Ct Abdomen Pelvis Wo Contrast  12/30/2013   CLINICAL DATA:  Lung mass seen on chest x-ray. Staging. Based on previous history the patient has a history of left nephrectomy for renal cell carcinoma.  EXAM: CT CHEST, ABDOMEN AND PELVIS WITHOUT CONTRAST  TECHNIQUE: Multidetector CT imaging of the chest, abdomen and pelvis was performed following the standard protocol without IV contrast.  COMPARISON:  CT of the abdomen and pelvis on 09/19/2011  FINDINGS: CT CHEST FINDINGS  Heart: Heart size is normal. No significant coronary artery calcification identified.  Vascular structures: There is atherosclerotic calcification of the thoracic aorta. No aneurysm.  Mediastinum/thyroid: The  visualized portion of the thyroid gland has a normal appearance. Small calcified bilateral hilar lymph nodes are identified. No noncalcified mediastinal, hilar, or axillary adenopathy.  Lungs: No pulmonary nodules, pleural effusions, or infiltrates. Specifically, the right upper lobe/apex is unremarkable. There is a calcified granuloma in the left lower lobe. High-resolution images are unremarkable.  Chest wall/osseous structures: Mild, symmetric bilateral gynecomastia.  CT ABDOMEN AND PELVIS FINDINGS  Upper abdomen: There are numerous splenic granulomata. No focal abnormality identified within the liver, pancreas, or right kidney. The right adrenal gland has a normal appearance. The left kidney is absent. Posterior to left adrenal gland, there is an area of probable fat necrosis or other benign lesion measuring 3.7 cm and stable appearance. The gallbladder is present.  Bowel: There is a moderate hiatal hernia. Small bowel loops are normal in appearance. The appendix is well seen and has a normal appearance. There is moderate stool burden. Scattered colonic diverticular present. No acute diverticulitis.  Pelvis: Penile implant is present in the left hemipelvis. Urinary bladder is normal in appearance.  Retroperitoneum: No retroperitoneal or mesenteric adenopathy. No evidence for aortic aneurysm.  Abdominal wall: Small periumbilical hernia contains mesenteric fat.  Osseous structures: Degenerative changes are seen at L5-S1.  IMPRESSION: 1. No evidence for lung mass. 2. Changes consistent prior granulomatous disease. 3. Mild bilateral gynecomastia. 4. Stable changes following left nephrectomy. 5. Moderate hiatal hernia. 6. Normal appendix. 7. Moderate stool burden. 8.  Colonic diverticulosis. 9. Small periumbilical hernia contains only mesenteric fat.   Electronically Signed   By: Shon Hale M.D.   On: 12/30/2013 17:46   Ct Head Wo Contrast  12/29/2013   CLINICAL DATA:  Difficulty walking.  Altered mental status.   EXAM: CT HEAD WITHOUT CONTRAST  TECHNIQUE: Contiguous axial images were obtained from the base of the skull through the vertex without intravenous contrast.  COMPARISON:  None.  FINDINGS: A mass lesion in the right frontal lobe measures 3.3 x 4.4 cm on image 21. There is extensive surrounding vasogenic edema. There may be a second small mass in the left frontal lobe on image 22 measuring 1.2 cm in diameter. No other mass is seen. There is no midline shift or hydrocephalus. No hemorrhage is seen. The calvarium is intact.  IMPRESSION: Large mass lesion in the right frontal lobe with a likely second mass in the left frontal lobe. Brain MRI with and without contrast is recommended for further evaluation.  Critical Value/emergent results were called by telephone at the time of interpretation on 12/29/2013 at 8:46 pm to DAN FLOYD, P.A., who verbally acknowledged these results.   Electronically Signed   By: Inge Rise M.D.   On: 12/29/2013 20:48   Ct Chest Wo Contrast  12/30/2013   CLINICAL DATA:  Lung mass seen on chest x-ray. Staging. Based on previous history the patient has a history of left nephrectomy for renal cell carcinoma.  EXAM: CT CHEST, ABDOMEN AND PELVIS WITHOUT CONTRAST  TECHNIQUE: Multidetector CT imaging of the chest, abdomen and pelvis was performed following the standard protocol without IV contrast.  COMPARISON:  CT of the abdomen and pelvis on 09/19/2011  FINDINGS: CT CHEST FINDINGS  Heart: Heart size is normal. No significant coronary artery calcification identified.  Vascular structures: There is atherosclerotic calcification of the thoracic aorta. No aneurysm.  Mediastinum/thyroid: The visualized portion of the thyroid gland has a normal appearance. Small calcified bilateral hilar lymph nodes are identified. No noncalcified mediastinal, hilar, or axillary adenopathy.  Lungs: No pulmonary nodules, pleural effusions, or infiltrates. Specifically, the right upper lobe/apex is unremarkable.  There is a calcified granuloma in the left lower lobe. High-resolution images are unremarkable.  Chest wall/osseous structures: Mild, symmetric bilateral gynecomastia.  CT ABDOMEN AND PELVIS FINDINGS  Upper abdomen: There are numerous splenic granulomata. No focal abnormality identified within the liver, pancreas, or right kidney. The right adrenal gland has a normal appearance. The left kidney is absent. Posterior to left adrenal gland, there is an area of probable fat necrosis or other benign lesion measuring 3.7 cm and stable appearance. The gallbladder is present.  Bowel: There is a moderate hiatal hernia. Small bowel loops are normal in appearance. The appendix is well seen and has a normal appearance. There is moderate stool burden. Scattered colonic diverticular present. No acute diverticulitis.  Pelvis: Penile implant is present in the left hemipelvis. Urinary bladder is normal in appearance.  Retroperitoneum: No retroperitoneal or mesenteric adenopathy. No evidence for aortic aneurysm.  Abdominal wall: Small periumbilical hernia contains mesenteric fat.  Osseous structures: Degenerative changes are seen at L5-S1.  IMPRESSION: 1. No evidence for lung mass. 2. Changes consistent prior granulomatous disease. 3. Mild bilateral gynecomastia. 4. Stable changes following left nephrectomy. 5. Moderate hiatal hernia. 6. Normal appendix. 7. Moderate stool burden. 8. Colonic diverticulosis. 9. Small periumbilical hernia contains only mesenteric fat.   Electronically Signed   By: Shon Hale M.D.   On: 12/30/2013 17:46   Mr Brain  W Wo Contrast  12/30/2013   CLINICAL DATA:  Brain tumor  EXAM: MRI HEAD WITHOUT AND WITH CONTRAST  TECHNIQUE: Multiplanar, multiecho pulse sequences of the brain and surrounding structures were obtained without and with intravenous contrast.  CONTRAST:  32mL MULTIHANCE GADOBENATE DIMEGLUMINE 529 MG/ML IV SOLN  COMPARISON:  Prior CT from 12/29/2013  FINDINGS: An irregular intra-axial mass  measuring 4.0 (AP) x 3.1 (transverse) x 4.1 (craniocaudad) cm is seen within the medial right frontal lobe. The mass demonstrates hypointense T1 signal with irregular peripheral enhancement. Probable necrosis seen centrally within this lesion. Surrounding hyperintense FLAIR signal consistent with vasogenic edema seen within the adjacent right frontal lobe and anterior right coarse post callosum. No enhancement or definite infiltration across the corpus callosum to the contralateral left frontal lobe identified. There is mass effect on the adjacent frontal horn of the right lateral with approximately 9 mm of localized right-to-left midline shift at the level of the lesion. No hydrocephalus.  No other abnormal enhancement or mass lesions identified within the brain. Probable mild scattered small vessel ischemic changes present within the periventricular and deep white matter both cerebral hemispheres.  Pituitary gland within normal limits. No acute abnormality seen about the orbits.  Craniocervical junction is within normal limits. Visualized bone marrow signal intensity is normal. Scalp soft tissues are unremarkable.  Paranasal sinuses and mastoid air cells are grossly clear.  IMPRESSION: 4.0 x 3.1 x 4.1 cm enhancing mass within the right frontal lobe with associated mass effect as above. Differential considerations include a primary high-grade neoplasm versus metastatic disease given the history of renal cell carcinoma.   Electronically Signed   By: Jeannine Boga M.D.   On: 12/30/2013 22:05   Dg Chest Portable 1 View  12/29/2013   CLINICAL DATA:  Weakness.  EXAM: PORTABLE CHEST - 1 VIEW  COMPARISON:  10/08/2013  FINDINGS: The cardiac silhouette, mediastinal and hilar contours are within normal limits and stable. Slight increasingright upper lobe/ right paratracheal density. Chest CT recommended for further evaluation given the patient's history of smoking. Stable calcified granuloma in the left lung. No  pleural effusions or pneumothorax. The bony thorax is intact.  IMPRESSION: Increasing right apical density. Recommend chest CT for further evaluation.   Electronically Signed   By: Kalman Jewels M.D.   On: 12/29/2013 20:25    Assessment/Plan: Right brain tumor: At this point this is looking more like a primary tumor to me. But of course a metastasis is a possibility as well. I have discussed the situation with the patient. We are going to plan surgery tomorrow afternoon. I have answered all his questions regarding the craniotomy for resection of the tumor.   LOS: 2 days     Taishawn Smaldone D 12/31/2013, 7:52 AM

## 2013-12-31 NOTE — Progress Notes (Signed)
Upon asking pt for signature for surgical consent, pt inquired about advanced directives.  Social work consult was placed so that options could be discussed with him and his wife.

## 2014-01-01 ENCOUNTER — Encounter (HOSPITAL_COMMUNITY)
Admission: EM | Disposition: A | Discharge status: Home / Self Care | Payer: Self-pay | Source: Home / Self Care | Attending: Neurosurgery

## 2014-01-01 ENCOUNTER — Inpatient Hospital Stay (HOSPITAL_COMMUNITY): Payer: BC Managed Care – PPO | Admitting: Anesthesiology

## 2014-01-01 ENCOUNTER — Inpatient Hospital Stay (HOSPITAL_COMMUNITY): Payer: BC Managed Care – PPO

## 2014-01-01 ENCOUNTER — Encounter (HOSPITAL_COMMUNITY): Payer: BC Managed Care – PPO | Admitting: Anesthesiology

## 2014-01-01 DIAGNOSIS — D496 Neoplasm of unspecified behavior of brain: Secondary | ICD-10-CM | POA: Diagnosis present

## 2014-01-01 HISTORY — PX: CRANIOTOMY: SHX93

## 2014-01-01 LAB — POCT I-STAT 7, (LYTES, BLD GAS, ICA,H+H)
Bicarbonate: 24.7 mEq/L — ABNORMAL HIGH (ref 20.0–24.0)
CALCIUM ION: 1.15 mmol/L (ref 1.13–1.30)
HCT: 30 % — ABNORMAL LOW (ref 39.0–52.0)
HEMOGLOBIN: 10.2 g/dL — AB (ref 13.0–17.0)
O2 Saturation: 100 %
PH ART: 7.398 (ref 7.350–7.450)
POTASSIUM: 4.4 meq/L (ref 3.7–5.3)
Sodium: 138 mEq/L (ref 137–147)
TCO2: 26 mmol/L (ref 0–100)
pCO2 arterial: 39.7 mmHg (ref 35.0–45.0)
pO2, Arterial: 253 mmHg — ABNORMAL HIGH (ref 80.0–100.0)

## 2014-01-01 LAB — CBC
HCT: 37 % — ABNORMAL LOW (ref 39.0–52.0)
HEMOGLOBIN: 11.9 g/dL — AB (ref 13.0–17.0)
MCH: 30.8 pg (ref 26.0–34.0)
MCHC: 32.2 g/dL (ref 30.0–36.0)
MCV: 95.9 fL (ref 78.0–100.0)
Platelets: 278 10*3/uL (ref 150–400)
RBC: 3.86 MIL/uL — AB (ref 4.22–5.81)
RDW: 13.5 % (ref 11.5–15.5)
WBC: 21.8 10*3/uL — ABNORMAL HIGH (ref 4.0–10.5)

## 2014-01-01 LAB — BASIC METABOLIC PANEL
Anion gap: 11 (ref 5–15)
BUN: 27 mg/dL — ABNORMAL HIGH (ref 6–23)
CO2: 26 meq/L (ref 19–32)
Calcium: 9.1 mg/dL (ref 8.4–10.5)
Chloride: 106 mEq/L (ref 96–112)
Creatinine, Ser: 1.7 mg/dL — ABNORMAL HIGH (ref 0.50–1.35)
GFR calc Af Amer: 47 mL/min — ABNORMAL LOW (ref >90)
GFR calc non Af Amer: 40 mL/min — ABNORMAL LOW (ref >90)
GLUCOSE: 138 mg/dL — AB (ref 70–99)
POTASSIUM: 4.6 meq/L (ref 3.7–5.3)
Sodium: 143 mEq/L (ref 137–147)

## 2014-01-01 LAB — SURGICAL PCR SCREEN
MRSA, PCR: NEGATIVE
STAPHYLOCOCCUS AUREUS: NEGATIVE

## 2014-01-01 LAB — PREPARE RBC (CROSSMATCH)

## 2014-01-01 LAB — ABO/RH: ABO/RH(D): A POS

## 2014-01-01 SURGERY — CRANIOTOMY TUMOR EXCISION
Anesthesia: General | Site: Head

## 2014-01-01 MED ORDER — HYDROMORPHONE HCL PF 1 MG/ML IJ SOLN: 0.2500 mg | INTRAMUSCULAR | Status: DC | PRN

## 2014-01-01 MED ORDER — MICROFIBRILLAR COLL HEMOSTAT EX PADS
MEDICATED_PAD | CUTANEOUS | Status: DC | PRN
  Administered 2014-01-01: 1 via TOPICAL

## 2014-01-01 MED ORDER — PANTOPRAZOLE SODIUM 40 MG IV SOLR
40.0000 mg | INTRAVENOUS | Status: DC
  Administered 2014-01-01: 40 mg via INTRAVENOUS
  Filled 2014-01-01 (×2): qty 40

## 2014-01-01 MED ORDER — 0.9 % SODIUM CHLORIDE (POUR BTL) OPTIME
TOPICAL | Status: DC | PRN
  Administered 2014-01-01 (×3): 1000 mL

## 2014-01-01 MED ORDER — LIDOCAINE HCL (CARDIAC) 20 MG/ML IV SOLN
INTRAVENOUS | Status: AC
  Filled 2014-01-01: qty 5

## 2014-01-01 MED ORDER — LIDOCAINE HCL 4 % MT SOLN
OROMUCOSAL | Status: DC | PRN
  Administered 2014-01-01: 2 mL via TOPICAL

## 2014-01-01 MED ORDER — PROPOFOL 10 MG/ML IV BOLUS
INTRAVENOUS | Status: DC | PRN
  Administered 2014-01-01: 200 mg via INTRAVENOUS

## 2014-01-01 MED ORDER — MANNITOL 25 % IV SOLN
INTRAVENOUS | Status: DC | PRN
  Administered 2014-01-01: 25 g via INTRAVENOUS

## 2014-01-01 MED ORDER — SODIUM CHLORIDE 0.9 % IJ SOLN
INTRAMUSCULAR | Status: AC
  Filled 2014-01-01: qty 10

## 2014-01-01 MED ORDER — OXYCODONE HCL 5 MG/5ML PO SOLN: 5.0000 mg | Freq: Once | ORAL | Status: DC | PRN

## 2014-01-01 MED ORDER — ROCURONIUM BROMIDE 50 MG/5ML IV SOLN
INTRAVENOUS | Status: AC
  Filled 2014-01-01: qty 2

## 2014-01-01 MED ORDER — PROPOFOL 10 MG/ML IV BOLUS
INTRAVENOUS | Status: AC
  Filled 2014-01-01: qty 20

## 2014-01-01 MED ORDER — ARTIFICIAL TEARS OP OINT
TOPICAL_OINTMENT | OPHTHALMIC | Status: DC | PRN
  Administered 2014-01-01: 1 via OPHTHALMIC

## 2014-01-01 MED ORDER — ROCURONIUM BROMIDE 100 MG/10ML IV SOLN
INTRAVENOUS | Status: DC | PRN
  Administered 2014-01-01: 20 mg via INTRAVENOUS
  Administered 2014-01-01: 50 mg via INTRAVENOUS
  Administered 2014-01-01 (×2): 20 mg via INTRAVENOUS

## 2014-01-01 MED ORDER — THROMBIN 20000 UNITS EX SOLR
CUTANEOUS | Status: DC | PRN
  Administered 2014-01-01: 19:00:00 via TOPICAL

## 2014-01-01 MED ORDER — SUFENTANIL CITRATE 50 MCG/ML IV SOLN
INTRAVENOUS | Status: DC | PRN
  Administered 2014-01-01: 10 ug via INTRAVENOUS
  Administered 2014-01-01 (×2): 20 ug via INTRAVENOUS

## 2014-01-01 MED ORDER — SODIUM CHLORIDE 0.9 % IR SOLN
Status: DC | PRN
  Administered 2014-01-01: 19:00:00

## 2014-01-01 MED ORDER — THROMBIN 5000 UNITS EX SOLR
CUTANEOUS | Status: DC | PRN
  Administered 2014-01-01: 5000 [IU] via TOPICAL

## 2014-01-01 MED ORDER — DEXAMETHASONE SODIUM PHOSPHATE 10 MG/ML IJ SOLN
INTRAMUSCULAR | Status: DC | PRN
  Administered 2014-01-01: 20 mg via INTRAVENOUS

## 2014-01-01 MED ORDER — SUFENTANIL CITRATE 50 MCG/ML IV SOLN
INTRAVENOUS | Status: AC
  Filled 2014-01-01: qty 1

## 2014-01-01 MED ORDER — FENTANYL CITRATE 0.05 MG/ML IJ SOLN
INTRAMUSCULAR | Status: DC | PRN
  Administered 2014-01-01 (×3): 50 ug via INTRAVENOUS
  Administered 2014-01-01: 100 ug via INTRAVENOUS

## 2014-01-01 MED ORDER — PHENYTOIN SODIUM 50 MG/ML IJ SOLN
100.0000 mg | Freq: Three times a day (TID) | INTRAMUSCULAR | Status: DC
  Administered 2014-01-01 – 2014-01-02 (×2): 100 mg via INTRAVENOUS
  Filled 2014-01-01 (×6): qty 2

## 2014-01-01 MED ORDER — DEXAMETHASONE SODIUM PHOSPHATE 10 MG/ML IJ SOLN
INTRAMUSCULAR | Status: AC
  Filled 2014-01-01: qty 2

## 2014-01-01 MED ORDER — LIDOCAINE HCL (CARDIAC) 20 MG/ML IV SOLN
INTRAVENOUS | Status: DC | PRN
  Administered 2014-01-01: 80 mg via INTRAVENOUS
  Administered 2014-01-01 (×2): 50 mg via INTRAVENOUS

## 2014-01-01 MED ORDER — GLYCOPYRROLATE 0.2 MG/ML IJ SOLN
INTRAMUSCULAR | Status: DC | PRN
  Administered 2014-01-01: 0.6 mg via INTRAVENOUS

## 2014-01-01 MED ORDER — ESMOLOL HCL 10 MG/ML IV SOLN
INTRAVENOUS | Status: DC | PRN
  Administered 2014-01-01: 20 mg via INTRAVENOUS

## 2014-01-01 MED ORDER — SODIUM CHLORIDE 0.9 % IV SOLN: INTRAVENOUS | Status: DC | PRN

## 2014-01-01 MED ORDER — OXYCODONE HCL 5 MG PO TABS: 5.0000 mg | ORAL_TABLET | Freq: Once | ORAL | Status: DC | PRN

## 2014-01-01 MED ORDER — SODIUM CHLORIDE 0.9 % IV SOLN
10.0000 mg | INTRAVENOUS | Status: DC | PRN
  Administered 2014-01-01: 10 ug/min via INTRAVENOUS

## 2014-01-01 MED ORDER — ONDANSETRON HCL 4 MG/2ML IJ SOLN
INTRAMUSCULAR | Status: DC | PRN
Start: 2014-01-01 — End: 2014-01-01
  Administered 2014-01-01: 4 mg via INTRAVENOUS

## 2014-01-01 MED ORDER — PROMETHAZINE HCL 25 MG/ML IJ SOLN: 6.2500 mg | INTRAMUSCULAR | Status: DC | PRN

## 2014-01-01 MED ORDER — CITALOPRAM HYDROBROMIDE 10 MG PO TABS
20.0000 mg | ORAL_TABLET | Freq: Every day | ORAL | Status: DC
  Administered 2014-01-02 – 2014-01-06 (×5): 20 mg via ORAL
  Filled 2014-01-01 (×2): qty 2
  Filled 2014-01-01 (×3): qty 1
  Filled 2014-01-01: qty 2

## 2014-01-01 MED ORDER — MEPERIDINE HCL 25 MG/ML IJ SOLN: 6.2500 mg | INTRAMUSCULAR | Status: DC | PRN

## 2014-01-01 MED ORDER — POTASSIUM CHLORIDE IN NACL 20-0.9 MEQ/L-% IV SOLN
INTRAVENOUS | Status: DC
  Administered 2014-01-02 – 2014-01-04 (×4): via INTRAVENOUS
  Filled 2014-01-01 (×6): qty 1000

## 2014-01-01 MED ORDER — BUPIVACAINE-EPINEPHRINE 0.5% -1:200000 IJ SOLN
INTRAMUSCULAR | Status: DC | PRN
  Administered 2014-01-01: 10 mL

## 2014-01-01 MED ORDER — NEOSTIGMINE METHYLSULFATE 10 MG/10ML IV SOLN
INTRAVENOUS | Status: DC | PRN
  Administered 2014-01-01: 5 mg via INTRAVENOUS

## 2014-01-01 MED ORDER — DEXTROSE 5 % IV SOLN
INTRAVENOUS | Status: DC | PRN
  Administered 2014-01-01: 18:00:00 via INTRAVENOUS

## 2014-01-01 MED ORDER — LABETALOL HCL 5 MG/ML IV SOLN
INTRAVENOUS | Status: DC | PRN
  Administered 2014-01-01 (×2): 10 mg via INTRAVENOUS

## 2014-01-01 MED ORDER — CEFAZOLIN SODIUM-DEXTROSE 2-3 GM-% IV SOLR
INTRAVENOUS | Status: DC | PRN
  Administered 2014-01-01: 2 g via INTRAVENOUS

## 2014-01-01 MED ORDER — LEVETIRACETAM IN NACL 500 MG/100ML IV SOLN
500.0000 mg | Freq: Once | INTRAVENOUS | Status: AC
Start: 2014-01-01 — End: 2014-01-01
  Administered 2014-01-01: 500 mg via INTRAVENOUS
  Filled 2014-01-01: qty 100

## 2014-01-01 SURGICAL SUPPLY — 97 items
BAG DECANTER FOR FLEXI CONT (MISCELLANEOUS) ×3 IMPLANT
BIT DRILL WIRE PASS 1.3MM (BIT) IMPLANT
BLADE CLIPPER SURG NEURO (BLADE) IMPLANT
BLADE ULTRA TIP 2M (BLADE) IMPLANT
BRUSH SCRUB EZ PLAIN DRY (MISCELLANEOUS) ×3 IMPLANT
BUR ACORN 6.0 PRECISION (BURR) ×2 IMPLANT
BUR ACORN 6.0MM PRECISION (BURR) ×1
BUR ROUTER D-58 CRANI (BURR) ×2 IMPLANT
CANISTER SUCT 3000ML (MISCELLANEOUS) ×6 IMPLANT
CATH VENTRIC 35X38 W/TROCAR LG (CATHETERS) IMPLANT
CLIP TI MEDIUM 6 (CLIP) IMPLANT
CONT SPEC 4OZ CLIKSEAL STRL BL (MISCELLANEOUS) ×6 IMPLANT
CORDS BIPOLAR (ELECTRODE) ×1 IMPLANT
COVER TABLE BACK 60X90 (DRAPES) ×4 IMPLANT
DRAIN SNY WOU 7FLT (WOUND CARE) IMPLANT
DRAPE MICROSCOPE LEICA (MISCELLANEOUS) ×3 IMPLANT
DRAPE NEUROLOGICAL W/INCISE (DRAPES) ×3 IMPLANT
DRAPE SURG 17X23 STRL (DRAPES) ×2 IMPLANT
DRAPE WARM FLUID 44X44 (DRAPE) ×3 IMPLANT
DRILL WIRE PASS 1.3MM (BIT)
DRSG TELFA 3X8 NADH (GAUZE/BANDAGES/DRESSINGS) IMPLANT
ELECT CAUTERY BLADE 6.4 (BLADE) ×1 IMPLANT
ELECT REM PT RETURN 9FT ADLT (ELECTROSURGICAL) ×3
ELECTRODE REM PT RTRN 9FT ADLT (ELECTROSURGICAL) ×1 IMPLANT
EVACUATOR 1/8 PVC DRAIN (DRAIN) IMPLANT
EVACUATOR SILICONE 100CC (DRAIN) IMPLANT
GAUZE SPONGE 2X2 8PLY STRL LF (GAUZE/BANDAGES/DRESSINGS) IMPLANT
GAUZE SPONGE 4X4 12PLY STRL (GAUZE/BANDAGES/DRESSINGS) IMPLANT
GAUZE SPONGE 4X4 16PLY XRAY LF (GAUZE/BANDAGES/DRESSINGS) IMPLANT
GLOVE BIO SURGEON STRL SZ 6.5 (GLOVE) ×2 IMPLANT
GLOVE BIO SURGEON STRL SZ8.5 (GLOVE) ×3 IMPLANT
GLOVE BIO SURGEONS STRL SZ 6.5 (GLOVE) ×2
GLOVE BIOGEL PI IND STRL 7.0 (GLOVE) IMPLANT
GLOVE BIOGEL PI IND STRL 7.5 (GLOVE) IMPLANT
GLOVE BIOGEL PI INDICATOR 7.0 (GLOVE) ×2
GLOVE BIOGEL PI INDICATOR 7.5 (GLOVE) ×2
GLOVE ECLIPSE 7.5 STRL STRAW (GLOVE) ×4 IMPLANT
GLOVE EXAM NITRILE LRG STRL (GLOVE) IMPLANT
GLOVE EXAM NITRILE MD LF STRL (GLOVE) ×4 IMPLANT
GLOVE EXAM NITRILE XL STR (GLOVE) IMPLANT
GLOVE EXAM NITRILE XS STR PU (GLOVE) IMPLANT
GLOVE SS BIOGEL STRL SZ 8 (GLOVE) ×1 IMPLANT
GLOVE SUPERSENSE BIOGEL SZ 8 (GLOVE) ×6
GOWN STRL REUS W/ TWL LRG LVL3 (GOWN DISPOSABLE) IMPLANT
GOWN STRL REUS W/ TWL XL LVL3 (GOWN DISPOSABLE) ×1 IMPLANT
GOWN STRL REUS W/TWL LRG LVL3 (GOWN DISPOSABLE) ×6
GOWN STRL REUS W/TWL XL LVL3 (GOWN DISPOSABLE) ×3
HEMOSTAT SURGICEL 2X14 (HEMOSTASIS) ×3 IMPLANT
KIT BASIN OR (CUSTOM PROCEDURE TRAY) ×3 IMPLANT
KIT DRAIN CSF ACCUDRAIN (MISCELLANEOUS) IMPLANT
KIT ROOM TURNOVER OR (KITS) ×3 IMPLANT
MARKER SKIN DUAL TIP RULER LAB (MISCELLANEOUS) IMPLANT
MARKER SPHERE PSV REFLC NDI (MISCELLANEOUS) ×4 IMPLANT
NEEDLE HYPO 22GX1.5 SAFETY (NEEDLE) ×3 IMPLANT
NS IRRIG 1000ML POUR BTL (IV SOLUTION) ×5 IMPLANT
PACK CRANIOTOMY (CUSTOM PROCEDURE TRAY) ×3 IMPLANT
PAD ARMBOARD 7.5X6 YLW CONV (MISCELLANEOUS) ×5 IMPLANT
PAD DRESSING TELFA 3X8 NADH (GAUZE/BANDAGES/DRESSINGS) IMPLANT
PATTIES SURGICAL .25X.25 (GAUZE/BANDAGES/DRESSINGS) IMPLANT
PATTIES SURGICAL .5 X.5 (GAUZE/BANDAGES/DRESSINGS) IMPLANT
PATTIES SURGICAL .5 X3 (DISPOSABLE) IMPLANT
PATTIES SURGICAL 1X1 (DISPOSABLE) IMPLANT
PIN MAYFIELD SKULL DISP (PIN) ×2 IMPLANT
PLATE 1.5  2HOLE LNG NEURO (Plate) ×6 IMPLANT
PLATE 1.5 2HOLE LNG NEURO (Plate) IMPLANT
RUBBERBAND STERILE (MISCELLANEOUS) ×6 IMPLANT
SCREW SELF DRILL HT 1.5/4MM (Screw) ×12 IMPLANT
SET TUBING W/EXT DISP (INSTRUMENTS) ×2 IMPLANT
SPECIMEN JAR SMALL (MISCELLANEOUS) IMPLANT
SPONGE GAUZE 2X2 STER 10/PKG (GAUZE/BANDAGES/DRESSINGS) ×2
SPONGE NEURO XRAY DETECT 1X3 (DISPOSABLE) IMPLANT
SPONGE SURGIFOAM ABS GEL 100 (HEMOSTASIS) ×3 IMPLANT
STAPLER SKIN PROX WIDE 3.9 (STAPLE) ×3 IMPLANT
STOCKINETTE 6  STRL (DRAPES)
STOCKINETTE 6 STRL (DRAPES) IMPLANT
SUT ETHILON 3 0 FSL (SUTURE) IMPLANT
SUT ETHILON 3 0 PS 1 (SUTURE) IMPLANT
SUT NURALON 4 0 TR CR/8 (SUTURE) ×6 IMPLANT
SUT PROLENE 6 0 BV (SUTURE) IMPLANT
SUT SILK 0 TIES 10X30 (SUTURE) IMPLANT
SUT VIC AB 2-0 CP2 18 (SUTURE) ×3 IMPLANT
SUT VIC AB 3-0 FS2 27 (SUTURE) IMPLANT
SUT VICRYL 4-0 PS2 18IN ABS (SUTURE) IMPLANT
SYR 20ML ECCENTRIC (SYRINGE) ×3 IMPLANT
SYR CONTROL 10ML LL (SYRINGE) ×1 IMPLANT
TAPE CLOTH SURG 4X10 WHT LF (GAUZE/BANDAGES/DRESSINGS) ×2 IMPLANT
TIP NONSTICK .5MMX23CM (INSTRUMENTS) ×3
TIP NONSTICK .5X23 (INSTRUMENTS) IMPLANT
TIP STRAIGHT 25KHZ (INSTRUMENTS) ×2 IMPLANT
TOWEL OR 17X24 6PK STRL BLUE (TOWEL DISPOSABLE) ×3 IMPLANT
TOWEL OR 17X26 10 PK STRL BLUE (TOWEL DISPOSABLE) ×3 IMPLANT
TRAY FOLEY CATH 14FRSI W/METER (CATHETERS) IMPLANT
TRAY FOLEY CATH 16FRSI W/METER (SET/KITS/TRAYS/PACK) ×2 IMPLANT
TUBE CONNECTING 12'X1/4 (SUCTIONS) ×1
TUBE CONNECTING 12X1/4 (SUCTIONS) ×1 IMPLANT
UNDERPAD 30X30 INCONTINENT (UNDERPADS AND DIAPERS) IMPLANT
WATER STERILE IRR 1000ML POUR (IV SOLUTION) ×3 IMPLANT

## 2014-01-01 NOTE — Transfer of Care (Signed)
Immediate Anesthesia Transfer of Care Note  Patient: Edward [name]  Procedure(s) Performed: Procedure(s) with comments: CRANIOTOMY FOR TUMOR EXCISION (N/A) - Craniotomy for tumor resection with stealth  Patient Location: NICU  Anesthesia Type:General  Level of Consciousness: alert , oriented, sedated, patient cooperative and responds to stimulation  Airway & Oxygen Therapy: Patient Spontanous Breathing and Patient connected to face mask oxygen  Post-op Assessment: Report given to PACU RN, Post -op Vital signs reviewed and stable, Patient moving all extremities and Patient moving all extremities X 4  Post vital signs: Reviewed and stable  Complications: No apparent anesthesia complications

## 2014-01-01 NOTE — Progress Notes (Signed)
Chaplain Note: Chaplain responded to patient's request for an Advanced Directive. Patient was awake and alert, and seemed to be very nervous about the upcoming procedures. Family was present at bedside. Patient did not request any other services at this time, but would like a visit from the [facility] after his surgery.     Gean Quint, Chaplain

## 2014-01-01 NOTE — Op Note (Signed)
Brief history: The patient is a 68 year old white male with a history of renal cell carcinoma who presented with mental status changes. He was worked up with a head CT which demonstrated a right brain tumor. He was further worked up with a CT scan of his chest abdomen and pelvis which was negative. He was worked up with a brain MRI which demonstrated a solitary right brain lesion. I discussed the various treatment options. I recommended that he undergo a craniotomy for resection of the tumor. The patient has weighed the risks, benefits, and alternative surgery and decided to proceed with the operation.  Preop diagnosis: Right frontal brain tumor  Postop diagnosis: The same  Procedure: Right frontal craniotomy for resection of tumor using microdissection and Stealth neuro-navigation  Surgeon: Dr. Earle Gell  Asst.: None  Anesthesia: Gen. endotracheal  Estimated blood loss: 150 cc  Specimens: Tumor  Drains: None  Complications: None  Description of procedure: The patient was brought to the operating room by the anesthesia team. General endotracheal anesthesia was induced. The patient remained in the supine position. I applied the Mayfield 3 point headrest to his calvarium. We kept his head in the neutral position, face up. We used surface merge with the Stealth neuro-navigation system to enter in the patient's Cordis to the Stealth system.  The patient's right scalp was then shaved with clippers and prepared with Betadine scrub and Betadine solution. Sterile drapes were applied. I then injected the area to incise with Marcaine with epinephrine solution. I then made an incision posterior to the coronal suture after confirming the tumors position with the Stealth neuronavigational system. I used Raney clips for wound edge hemostasis. I exposed the underlying calvarium with the periosteal elevators. We used cerebellar retractor and the buddy halo for exposure. I used a high-speed drill to create  a right frontal burr hole. I used the foot plate device to create a craniotomy flap. I elevated the craniotomy flap with the Penfield #3. The underlying dura was a bit tense so we gave the patient mannitol and the patient was mildly hyperventilated. I incised the dura in a cruciate fashion and tacked up the dural edges with 4-0 Nurolon suture.  We then brought the operative microscope into the field and under its medication and illumination we completed the microdissection. I used electrocautery to coagulate the surface of a gyrus overlying the tumor. I then used bipolar electrocautery and suction to create a small corticotomy. I dissected deeper with suction and bipolar cautery. We encountered the tumor. We sent of multiple biopsies for frozen and permanent section. The frozen section came back consistent with necrotic and inflammatory tissues. The pathologist could not tell me what the tumor was. The tumor appeared to me to be a malignant primary tumor with areas of necrosis, hypercellularity, thrombosed veins, etc. I used the Sonapet to internally debulk the tumor. I then used the sonopet, suction and bipolar electrocautery to remove all the tissue that appeared to be tumor with the aid of the Stealth neuronavigational system. After I had removed all the tumor I could see and encountered surrounding edematous brain we stoped the resection. I obtained hemostasis using bipolar electrocautery. We irrigated out the tumor resection cavity out with saline solution. We lined the tumor resection cavity with Surgicel. I then reapproximated the patient's dura with interrupted 4-0 Nurolon suture. I then replaced the craniotomy flap with titanium mini plates and screws. I removed the retractors. I reapproximated the patient's galea with interrupted 2-0 Vicryl suture. I  reapproximated the skin with stainless steel staples. The wound is then coated with bacitracin ointment. A sterile dressing was applied. The drapes were  removed. I then removed the Mayfield 3 point headrest from the patient's calvarium. This ended the procedure. By report all sponge, instrument, and needle counts were correct at the end this case.

## 2014-01-01 NOTE — Progress Notes (Signed)
Subjective:  The patient is alert and pleasant. He is in no apparent distress.  Objective: Vital signs in last 24 hours: Temp:  [97.7 F (36.5 C)-98.2 F (36.8 C)] 98.2 F (36.8 C) (08/19 1405) Pulse Rate:  [60-70] 63 (08/19 1405) Resp:  [18-20] 18 (08/19 1405) BP: (128-156)/(62-75) 156/69 mmHg (08/19 1405) SpO2:  [94 %-99 %] 97 % (08/19 1405)  Intake/Output from previous day: 08/18 0701 - 08/19 0700 In: 1200 [P.O.:600; I.V.:600] Out: 2200 [Urine:2200] Intake/Output this shift:    Physical exam the patient is alert and oriented. He is moving all 4 extremities.  Lab Results:  Recent Labs  12/30/13 0445 01/01/14 0547  WBC 12.6* 21.8*  HGB 11.4* 11.9*  HCT 35.6* 37.0*  PLT 269 278   BMET  Recent Labs  12/30/13 0445 01/01/14 0547  NA 138 143  K 4.7 4.6  CL 104 106  CO2 21 26  GLUCOSE 128* 138*  BUN 25* 27*  CREATININE 1.86* 1.70*  CALCIUM 8.8 9.1    Studies/Results: Mr Jeri Cos Wo Contrast  12/30/2013   CLINICAL DATA:  Brain tumor  EXAM: MRI HEAD WITHOUT AND WITH CONTRAST  TECHNIQUE: Multiplanar, multiecho pulse sequences of the brain and surrounding structures were obtained without and with intravenous contrast.  CONTRAST:  20mL MULTIHANCE GADOBENATE DIMEGLUMINE 529 MG/ML IV SOLN  COMPARISON:  Prior CT from 12/29/2013  FINDINGS: An irregular intra-axial mass measuring 4.0 (AP) x 3.1 (transverse) x 4.1 (craniocaudad) cm is seen within the medial right frontal lobe. The mass demonstrates hypointense T1 signal with irregular peripheral enhancement. Probable necrosis seen centrally within this lesion. Surrounding hyperintense FLAIR signal consistent with vasogenic edema seen within the adjacent right frontal lobe and anterior right coarse post callosum. No enhancement or definite infiltration across the corpus callosum to the contralateral left frontal lobe identified. There is mass effect on the adjacent frontal horn of the right lateral with approximately 9 mm of  localized right-to-left midline shift at the level of the lesion. No hydrocephalus.  No other abnormal enhancement or mass lesions identified within the brain. Probable mild scattered small vessel ischemic changes present within the periventricular and deep white matter both cerebral hemispheres.  Pituitary gland within normal limits. No acute abnormality seen about the orbits.  Craniocervical junction is within normal limits. Visualized bone marrow signal intensity is normal. Scalp soft tissues are unremarkable.  Paranasal sinuses and mastoid air cells are grossly clear.  IMPRESSION: 4.0 x 3.1 x 4.1 cm enhancing mass within the right frontal lobe with associated mass effect as above. Differential considerations include a primary high-grade neoplasm versus metastatic disease given the history of renal cell carcinoma.   Electronically Signed   By: Jeannine Boga M.D.   On: 12/30/2013 22:05    Assessment/Plan: Right brain tumor: I discussed situation with the patient. I have answered all his questions regarding surgery. He was to proceed with a craniotomy.  LOS: 3 days     Lillie Portner D 01/01/2014, 5:07 PM

## 2014-01-01 NOTE — Progress Notes (Signed)
Subjective:  The patient is alert and pleasant. He looks well. He is in no apparent distress.  Objective: Vital signs in last 24 hours: Temp:  [97.8 F (36.6 C)-98.2 F (36.8 C)] 98.2 F (36.8 C) (08/19 1405) Pulse Rate:  [60-70] 63 (08/19 1405) Resp:  [18-20] 18 (08/19 1405) BP: (132-156)/(68-75) 156/69 mmHg (08/19 1405) SpO2:  [94 %-99 %] 97 % (08/19 1405)  Intake/Output from previous day: 08/18 0701 - 08/19 0700 In: 1200 [P.O.:600; I.V.:600] Out: 2200 [Urine:2200] Intake/Output this shift: Total I/O In: 900 [I.V.:900] Out: 500 [Urine:300; Blood:200]  Physical exam the patient is alert and pleasant. He is moving all 4 extremities well.  Lab Results:  Recent Labs  12/30/13 0445 01/01/14 0547  WBC 12.6* 21.8*  HGB 11.4* 11.9*  HCT 35.6* 37.0*  PLT 269 278   BMET  Recent Labs  12/30/13 0445 01/01/14 0547  NA 138 143  K 4.7 4.6  CL 104 106  CO2 21 26  GLUCOSE 128* 138*  BUN 25* 27*  CREATININE 1.86* 1.70*  CALCIUM 8.8 9.1    Studies/Results: No results found.  Assessment/Plan: The patient is doing well.  LOS: 3 days     Shivali Quackenbush D 01/01/2014, 10:07 PM

## 2014-01-01 NOTE — Progress Notes (Signed)
Patient arrived after surgery and the physician stated that he wanted a full face mask that would not interfere with the incision.  There is no mask available that will not interfere with cranial incision.  Rt is unable to fit the patient with CPAP at this time.  RT will attempt to place on CPAP home settings tomorrow evening.

## 2014-01-01 NOTE — Anesthesia Procedure Notes (Signed)
Procedure Name: Intubation Date/Time: 01/01/2014 5:45 PM Performed by: Izora Gala Pre-anesthesia Checklist: Patient identified, Emergency Drugs available, Suction available and Patient being monitored Patient Re-evaluated:Patient Re-evaluated prior to inductionOxygen Delivery Method: Circle system utilized Preoxygenation: Pre-oxygenation with 100% oxygen Intubation Type: IV induction Ventilation: Mask ventilation without difficulty Laryngoscope Size: Miller and 3 Grade View: Grade I Tube type: Subglottic suction tube Tube size: 7.5 mm Number of attempts: 1 Airway Equipment and Method: Stylet,  Bite block and LTA kit utilized Placement Confirmation: ETT inserted through vocal cords under direct vision,  positive ETCO2 and breath sounds checked- equal and bilateral Secured at: 23 cm Tube secured with: Tape Dental Injury: Teeth and Oropharynx as per pre-operative assessment

## 2014-01-01 NOTE — Progress Notes (Signed)
[facility]                                                                              Patient Demographics  Edward [name], is a 68 y.o. male, DOB - 1946/03/14, OIN:[id number]  Admit date - 12/29/2013   Admitting Physician Theressa Millard, MD  Outpatient Primary MD for the patient is [name], MD  LOS - 3   Chief Complaint  Patient presents with  . Weakness      HPI on 12/29/2013 Edward [name] is a 68 y.o. male with a history of Renal Cell Carcinoma S/P left Nephrectomy who presented to the ED with complaints of increased falls and difficulty walking over the past 2 weeks, and hesitance of speech. He reported having headaches but denied visual changes, but did have diplopia 1 year ago. He was evaluated in the ED and a CT Scan was performed which revealed 2 Brain Masses, one measuring 3.3 cm x 4.4 cm with associated vasogenic edema, and the second mass at 1.2 cm diameter. A RUL and Paratracheal mass was also seen on his Chest X-ray. He was referred for medical admission.    Assessment & Plan   Brain mass with vasogenic edema -Seen on CT scan: Large mass lesion in the right frontal lobe with a likely second mass in the left frontal lobe.  -Patient noted to have some vasogenic edema, continue Decadron -MRI brain:4.0 x 3.1 x 4.1 cm enhancing mass within the right frontal lobe with associated mass effect  -Neurosurgery consulted and appreciated, planning for surgery today -CT Chest/abd/pelvis: No evidence of lung mass, consistent with granulomatous disease, stable changes following left nephrectomy -Oncology consulted and appreciated  Lung mass -Noted on CXR -However, not on CT chest, changes consistent with prior granulomatous disease  Acute on chronic kidney failure, stage III -Patient s/p renal cell carcinoma s/p Left nephrectomy -Continue IVF  -Creatinine trending downward -Lasix and ACEi held -Continue to monitor BMP  History of Renal Cell  carcinoma -2009 -S/p left nephrectomy -Patient did not see an oncologist at that time.  He did not receive chemotherapy or radiation.  Essential hypertension -Continue metoprolol  Hyperlipidemia -Continue statin  Obstructive sleep apnea -Continue CPAP QHS  Tobacco Abuse -Nicotine patch -Smoking cessation counseling  Depression/anxiety -Continue Wellbutrin, Celexa, Klonopin  Nausea/vomiting -Zofran PRN  Leukocytosis; Probably from the decadron. Continue to monitor.   Code Status: Full  Family Communication: None at bedside  Disposition Plan: pending further investigation.   Time Spent in minutes   25 minutes  Procedures  None  Consults   Neurosurgery Oncology  DVT Prophylaxis  Lovenox  Lab Results  Component Value Date   PLT 278 01/01/2014    Medications  Scheduled Meds: . allopurinol  100 mg Oral Daily  . atorvastatin  40 mg Oral Daily  . buPROPion  150 mg Oral Daily  . buPROPion  300 mg Oral Daily  . citalopram  40 mg Oral Daily  . clonazePAM  0.5 mg Oral QHS  . dexamethasone  10 mg Intravenous Q8H  . metoprolol succinate  100 mg Oral Daily  . nicotine  7 mg Transdermal Daily  . pantoprazole (PROTONIX) IV  40  mg Intravenous Q24H  . phenytoin (DILANTIN) IV  100 mg Intravenous 3 times per day  . zolpidem  5 mg Oral QHS   Continuous Infusions: . sodium chloride 75 mL/hr at 12/31/13 1131   PRN Meds:.0.9 % irrigation (POUR BTL), acetaminophen, acetaminophen, alum & mag hydroxide-simeth, bacitracin irrigation, bupivacaine-EPINEPHrine, HYDROmorphone (DILAUDID) injection, microfibrllar collagen, ondansetron (ZOFRAN) IV, ondansetron, oxyCODONE, Surgifoam 100 with Thrombin 20,000 units (20 ml) topical solution  Antibiotics    Anti-infectives   Start     Dose/Rate Route Frequency Ordered Stop   01/01/14 1851  bacitracin 50,000 Units in sodium chloride irrigation 0.9 % 500 mL irrigation       As needed 01/01/14 1855        Subjective:   Edward [name] seen and examined today.  Patient very anxious about the procedure today.   Objective:   Filed Vitals:   01/01/14 0143 01/01/14 0700 01/01/14 1028 01/01/14 1405  BP: 140/75 141/70 146/71 156/69  Pulse: 66 66 60 63  Temp: 97.8 F (36.6 C) 98.1 F (36.7 C) 98.1 F (36.7 C) 98.2 F (36.8 C)  TempSrc: Oral Oral Oral Oral  Resp: 18 18 20 18   Height:      Weight:      SpO2: 96% 94% 99% 97%    Wt Readings from Last 3 Encounters:  12/29/13 102.513 kg (226 lb)  12/29/13 102.513 kg (226 lb)  10/08/13 105.235 kg (232 lb)     Intake/Output Summary (Last 24 hours) at 01/01/14 1917 Last data filed at 01/01/14 1900  Gross per 24 hour  Intake    650 ml  Output   3400 ml  Net  -2750 ml    Exam  General: Well developed, well nourished, NAD, appears stated age  Cardiovascular: S1 S2 auscultated, no rubs, murmurs or gallops. Regular rate and rhythm.  Respiratory: Clear to auscultation bilaterally with equal chest rise  Abdomen: Soft, nontender, nondistended, + bowel sounds; vomiting  Extremities: warm dry without cyanosis clubbing or edema  Neuro: AAOx3, no focal deficits   Data Review   Micro Results Recent Results (from the past 240 hour(s))  SURGICAL PCR SCREEN     Status: None   Collection Time    01/01/14  5:09 AM      Result Value Ref Range Status   MRSA, PCR NEGATIVE  NEGATIVE Final   Staphylococcus aureus NEGATIVE  NEGATIVE Final   Comment:            The Xpert SA Assay (FDA     approved for NASAL specimens     in patients over 95 years of age),     is one component of     a comprehensive surveillance     program.  Test performance has     been validated by [employer] for patients greater     than or equal to 12 year old.     It is not intended     to diagnose infection nor to     guide or monitor treatment.    Radiology Reports Ct Head Wo Contrast  12/29/2013   CLINICAL DATA:  Difficulty walking.  Altered mental status.  EXAM: CT HEAD  WITHOUT CONTRAST  TECHNIQUE: Contiguous axial images were obtained from the base of the skull through the vertex without intravenous contrast.  COMPARISON:  None.  FINDINGS: A mass lesion in the right frontal lobe measures 3.3 x 4.4 cm on image 21. There is extensive surrounding  vasogenic edema. There may be a second small mass in the left frontal lobe on image 22 measuring 1.2 cm in diameter. No other mass is seen. There is no midline shift or hydrocephalus. No hemorrhage is seen. The calvarium is intact.  IMPRESSION: Large mass lesion in the right frontal lobe with a likely second mass in the left frontal lobe. Brain MRI with and without contrast is recommended for further evaluation.  Critical Value/emergent results were called by telephone at the time of interpretation on 12/29/2013 at 8:46 pm to DAN FLOYD, P.A., who verbally acknowledged these results.   Electronically Signed   By: Inge Rise M.D.   On: 12/29/2013 20:48   Dg Chest Portable 1 View  12/29/2013   CLINICAL DATA:  Weakness.  EXAM: PORTABLE CHEST - 1 VIEW  COMPARISON:  10/08/2013  FINDINGS: The cardiac silhouette, mediastinal and hilar contours are within normal limits and stable. Slight increasingright upper lobe/ right paratracheal density. Chest CT recommended for further evaluation given the patient's history of smoking. Stable calcified granuloma in the left lung. No pleural effusions or pneumothorax. The bony thorax is intact.  IMPRESSION: Increasing right apical density. Recommend chest CT for further evaluation.   Electronically Signed   By: Kalman Jewels M.D.   On: 12/29/2013 20:25    CBC  Recent Labs Lab 12/29/13 2006 12/29/13 2019 12/30/13 0445 01/01/14 0547  WBC 12.9*  --  12.6* 21.8*  HGB 11.7* 12.9* 11.4* 11.9*  HCT 36.6* 38.0* 35.6* 37.0*  PLT 290  --  269 278  MCV 94.1  --  94.2 95.9  MCH 30.1  --  30.2 30.8  MCHC 32.0  --  32.0 32.2  RDW 13.3  --  13.4 13.5  LYMPHSABS 2.7  --   --   --   MONOABS 1.3*  --    --   --   EOSABS 0.3  --   --   --   BASOSABS 0.1  --   --   --     Chemistries   Recent Labs Lab 12/29/13 2006 12/29/13 2019 12/30/13 0445 01/01/14 0547  NA 138 137 138 143  K 4.6 4.4 4.7 4.6  CL 102 105 104 106  CO2 25  --  21 26  GLUCOSE 93 97 128* 138*  BUN 28* 27* 25* 27*  CREATININE 2.18* 2.30* 1.86* 1.70*  CALCIUM 9.1  --  8.8 9.1  AST 19  --   --   --   ALT 18  --   --   --   ALKPHOS 122*  --   --   --   BILITOT 0.3  --   --   --    ------------------------------------------------------------------------------------------------------------------ estimated creatinine clearance is 53.7 ml/min (by C-G formula based on Cr of 1.7). ------------------------------------------------------------------------------------------------------------------ No results found for this basename: HGBA1C,  in the last 72 hours ------------------------------------------------------------------------------------------------------------------ No results found for this basename: CHOL, HDL, LDLCALC, TRIG, CHOLHDL, LDLDIRECT,  in the last 72 hours ------------------------------------------------------------------------------------------------------------------ No results found for this basename: TSH, T4TOTAL, FREET3, T3FREE, THYROIDAB,  in the last 72 hours ------------------------------------------------------------------------------------------------------------------ No results found for this basename: VITAMINB12, FOLATE, FERRITIN, TIBC, IRON, RETICCTPCT,  in the last 72 hours  Coagulation profile  Recent Labs Lab 12/29/13 2006  INR 1.08    No results found for this basename: DDIMER,  in the last 72 hours  Cardiac Enzymes No results found for this basename: CK, CKMB, TROPONINI, MYOGLOBIN,  in the last 168 hours ------------------------------------------------------------------------------------------------------------------ No components  found with this basename: POCBNP,      Wyatt Galvan D.O. on 01/01/2014 at 7:17 PM  Between 7am to 7pm - Pager - [phone]  After 7pm go to [email or web address] - password [email or web address]  And look for the night coverage person covering for me after hours  [facility] Office  [phone]

## 2014-01-01 NOTE — Anesthesia Preprocedure Evaluation (Addendum)
Anesthesia Evaluation  Patient identified by MRN, date of birth, ID band Patient awake    Reviewed: Allergy & Precautions, H&P , NPO status , Patient's Chart, lab work & pertinent test results  Airway Mallampati: II TM Distance: >3 FB Neck ROM: Full    Dental no notable dental hx.    Pulmonary neg pulmonary ROS, Current Smoker,  breath sounds clear to auscultation  Pulmonary exam normal       Cardiovascular hypertension, Pt. on medications Rhythm:Regular Rate:Normal     Neuro/Psych negative neurological ROS  negative psych ROS   GI/Hepatic negative GI ROS, Neg liver ROS,   Endo/Other  negative endocrine ROS  Renal/GU CRF and Renal InsufficiencyRenal diseaseSolitary kidney. S/P left nephrectomy for RCC.  negative genitourinary   Musculoskeletal negative musculoskeletal ROS (+)   Abdominal   Peds negative pediatric ROS (+)  Hematology negative hematology ROS (+)   Anesthesia Other Findings   Reproductive/Obstetrics negative OB ROS                          Anesthesia Physical Anesthesia Plan  ASA: III  Anesthesia Plan: General   Post-op Pain Management:    Induction: Intravenous  Airway Management Planned: Oral ETT  Additional Equipment: Arterial line  Intra-op Plan:   Post-operative Plan: Extubation in OR  Informed Consent: I have reviewed the patients History and Physical, chart, labs and discussed the procedure including the risks, benefits and alternatives for the proposed anesthesia with the patient or authorized representative who has indicated his/her understanding and acceptance.   Dental advisory given  Plan Discussed with: CRNA  Anesthesia Plan Comments: (2x PIV)        Anesthesia Quick Evaluation

## 2014-01-01 NOTE — Clinical Social Work Note (Signed)
CSW recevied voicemail this morning stating that patient would like to have advanced directives completed. CSW has requested assistance from spiritual care department. Chaplain will assist with advanced directives.   Liz Beach MSW, [place], [place], [phone]

## 2014-01-01 NOTE — Progress Notes (Signed)
Mr. Lamphier is scheduled for surgery today. He had his MRI and 1 at night. He only has 1 brain mass. This is in the right frontal lobe.  Given than he has no disease systemically, and only one tumor in the brain, I suspect now that he actually may have a primary glioblastoma or astrocytoma. He'll be very interesting to see what the pathology shows. If this is a primary brain malignancy, the key prognostic factor is whether or not it can be resected totally.  He is still having some issues with his mentation. He says he is having some "twitching" on the right side. He is on Dilantin now. He continues on Decadron.  I talked to him about the possibility of this being a primary brain tumor. I explained the typical treatment program after surgery. A lot will depend on genetic analysis of the bullet and see if this is a primary brain cancer. We will have pathology cell tumor of for genetic analysis with chromosomes and MGMT methylation status.  We will follow him up once we have the pathology report.  Pete E.  [place]brew[place] 12:12

## 2014-01-02 ENCOUNTER — Encounter (HOSPITAL_COMMUNITY): Payer: Self-pay | Admitting: Neurosurgery

## 2014-01-02 ENCOUNTER — Encounter (HOSPITAL_COMMUNITY): Payer: Self-pay | Admitting: Anesthesiology

## 2014-01-02 DIAGNOSIS — C719 Malignant neoplasm of brain, unspecified: Secondary | ICD-10-CM

## 2014-01-02 MED ORDER — DEXAMETHASONE SODIUM PHOSPHATE 10 MG/ML IJ SOLN
10.0000 mg | Freq: Four times a day (QID) | INTRAMUSCULAR | Status: AC
  Administered 2014-01-02 – 2014-01-03 (×4): 10 mg via INTRAVENOUS
  Filled 2014-01-02 (×2): qty 1

## 2014-01-02 MED ORDER — PROMETHAZINE HCL 25 MG/ML IJ SOLN
12.5000 mg | Freq: Four times a day (QID) | INTRAMUSCULAR | Status: DC | PRN
  Administered 2014-01-02: 12.5 mg via INTRAVENOUS
  Filled 2014-01-02 (×2): qty 1

## 2014-01-02 MED ORDER — CETYLPYRIDINIUM CHLORIDE 0.05 % MT LIQD
7.0000 mL | Freq: Two times a day (BID) | OROMUCOSAL | Status: DC
  Administered 2014-01-02 – 2014-01-06 (×7): 7 mL via OROMUCOSAL

## 2014-01-02 MED ORDER — PROMETHAZINE HCL 25 MG/ML IJ SOLN: 25.0000 mg | Freq: Four times a day (QID) | INTRAMUSCULAR | Status: DC | PRN

## 2014-01-02 MED ORDER — PANTOPRAZOLE SODIUM 40 MG IV SOLR: 40.0000 mg | Freq: Every day | INTRAVENOUS | Status: DC

## 2014-01-02 MED ORDER — DOCUSATE SODIUM 100 MG PO CAPS
100.0000 mg | ORAL_CAPSULE | Freq: Two times a day (BID) | ORAL | Status: DC
  Administered 2014-01-02 – 2014-01-06 (×9): 100 mg via ORAL
  Filled 2014-01-02 (×10): qty 1

## 2014-01-02 MED ORDER — LABETALOL HCL 5 MG/ML IV SOLN
10.0000 mg | INTRAVENOUS | Status: DC | PRN
  Administered 2014-01-03: 20 mg via INTRAVENOUS
  Administered 2014-01-03 (×2): 10 mg via INTRAVENOUS
  Filled 2014-01-02 (×2): qty 4

## 2014-01-02 MED ORDER — DEXAMETHASONE SODIUM PHOSPHATE 4 MG/ML IJ SOLN
4.0000 mg | Freq: Four times a day (QID) | INTRAMUSCULAR | Status: AC
  Administered 2014-01-03 – 2014-01-04 (×4): 4 mg via INTRAVENOUS
  Filled 2014-01-02 (×5): qty 1

## 2014-01-02 MED ORDER — PROMETHAZINE HCL 25 MG PO TABS
12.5000 mg | ORAL_TABLET | ORAL | Status: DC | PRN
  Filled 2014-01-02: qty 1

## 2014-01-02 MED ORDER — ONDANSETRON HCL 4 MG PO TABS: 4.0000 mg | ORAL_TABLET | ORAL | Status: DC | PRN

## 2014-01-02 MED ORDER — DEXAMETHASONE SODIUM PHOSPHATE 4 MG/ML IJ SOLN
4.0000 mg | Freq: Three times a day (TID) | INTRAMUSCULAR | Status: DC
  Administered 2014-01-04 – 2014-01-05 (×3): 4 mg via INTRAVENOUS
  Filled 2014-01-02 (×6): qty 1

## 2014-01-02 MED ORDER — MENTHOL 3 MG MT LOZG
1.0000 | LOZENGE | OROMUCOSAL | Status: DC | PRN
  Filled 2014-01-02: qty 9

## 2014-01-02 MED ORDER — MORPHINE SULFATE 2 MG/ML IJ SOLN: 1.0000 mg | INTRAMUSCULAR | Status: DC | PRN

## 2014-01-02 MED ORDER — PANTOPRAZOLE SODIUM 40 MG PO TBEC
40.0000 mg | DELAYED_RELEASE_TABLET | Freq: Two times a day (BID) | ORAL | Status: DC
  Administered 2014-01-02 – 2014-01-03 (×3): 40 mg via ORAL
  Filled 2014-01-02 (×4): qty 1

## 2014-01-02 MED ORDER — ACETAMINOPHEN 650 MG RE SUPP: 650.0000 mg | RECTAL | Status: DC | PRN

## 2014-01-02 MED ORDER — ONDANSETRON HCL 4 MG/2ML IJ SOLN
4.0000 mg | INTRAMUSCULAR | Status: DC | PRN
  Administered 2014-01-02 – 2014-01-03 (×2): 4 mg via INTRAVENOUS
  Filled 2014-01-02 (×2): qty 2

## 2014-01-02 MED ORDER — LEVETIRACETAM IN NACL 500 MG/100ML IV SOLN
500.0000 mg | Freq: Two times a day (BID) | INTRAVENOUS | Status: DC
  Administered 2014-01-02 – 2014-01-05 (×8): 500 mg via INTRAVENOUS
  Filled 2014-01-02 (×11): qty 100

## 2014-01-02 MED ORDER — DEXAMETHASONE SODIUM PHOSPHATE 4 MG/ML IJ SOLN: 4.0000 mg | Freq: Three times a day (TID) | INTRAMUSCULAR | Status: DC

## 2014-01-02 MED ORDER — ACETAMINOPHEN 325 MG PO TABS
650.0000 mg | ORAL_TABLET | ORAL | Status: DC | PRN
  Administered 2014-01-06: 650 mg via ORAL
  Filled 2014-01-02: qty 2

## 2014-01-02 MED ORDER — HYDROCODONE-ACETAMINOPHEN 5-325 MG PO TABS
1.0000 | ORAL_TABLET | ORAL | Status: DC | PRN
  Administered 2014-01-02: 1 via ORAL
  Filled 2014-01-02: qty 1

## 2014-01-02 MED ORDER — DEXAMETHASONE SODIUM PHOSPHATE 10 MG/ML IJ SOLN
10.0000 mg | Freq: Four times a day (QID) | INTRAMUSCULAR | Status: DC
  Administered 2014-01-02: 10 mg via INTRAVENOUS

## 2014-01-02 MED ORDER — DEXAMETHASONE SODIUM PHOSPHATE 4 MG/ML IJ SOLN: 4.0000 mg | Freq: Four times a day (QID) | INTRAMUSCULAR | Status: DC

## 2014-01-02 MED ORDER — CEFAZOLIN SODIUM-DEXTROSE 2-3 GM-% IV SOLR
2.0000 g | Freq: Three times a day (TID) | INTRAVENOUS | Status: AC
  Administered 2014-01-02 (×2): 2 g via INTRAVENOUS
  Filled 2014-01-02 (×2): qty 50

## 2014-01-02 NOTE — Progress Notes (Signed)
Mr. Asfaw had his surgery yesterday afternoon. He did quite well. He looks like he had a gross total resection. Arnoldo Morale today and fantastic job.  He looks good this morning. He is pretty alert. His speech is without problems. He's had no problems with his thought process.  We will await the pathology. I do suspect that this will be a primary brain cancer-likely glioblastoma. If so, I will make sure pathology will send off additional genetic studies.  All his vital signs are stable.  We will followup once we have the path report.  I did speak with radiation oncology yesterday. I gave them his name. I am sure that they will be by at some point once we have the path results back.  [place] 7:7

## 2014-01-02 NOTE — Anesthesia Postprocedure Evaluation (Signed)
  Anesthesia Post-op Note  Patient: Edward [name]  Procedure(s) Performed: Procedure(s) with comments: CRANIOTOMY FOR TUMOR EXCISION (N/A) - Craniotomy for tumor resection with stealth  Patient Location: NICU  Anesthesia Type:General  Level of Consciousness: awake and alert   Airway and Oxygen Therapy: Patient Spontanous Breathing and Patient connected to nasal cannula oxygen  Post-op Pain: mild  Post-op Assessment: Post-op Vital signs reviewed, Patient's Cardiovascular Status Stable, Respiratory Function Stable, Patent Airway and Pain level controlled  Post-op Vital Signs: stable  Last Vitals:  Filed Vitals:   01/02/14 0000  BP: 125/60  Pulse: 64  Temp:   Resp: 13    Complications: No apparent anesthesia complications

## 2014-01-02 NOTE — Progress Notes (Signed)
UR completed.  Addysyn Fern, RN BSN [facility] CCM Trauma/Neuro ICU Case Manager [phone]  

## 2014-01-02 NOTE — Progress Notes (Signed)
Patient ID: Edward [name], male   DOB: 06/10/1945, 68 y.o.   MRN: [id number] Subjective:  The patient is alert and pleasant. He looks well. He is in no apparent distress.  Objective: Vital signs in last 24 hours: Temp:  [98.1 F (36.7 C)-98.5 F (36.9 C)] 98.5 F (36.9 C) (08/20 0400) Pulse Rate:  [59-79] 63 (08/20 0600) Resp:  [11-22] 17 (08/20 0600) BP: (115-156)/(60-71) 132/66 mmHg (08/20 0600) SpO2:  [96 %-100 %] 100 % (08/20 0600) Arterial Line BP: (111-165)/(51-68) 162/68 mmHg (08/20 0600) Weight:  [108.7 kg (239 lb 10.2 oz)] 108.7 kg (239 lb 10.2 oz) (08/19 2237)  Intake/Output from previous day: 08/19 0701 - 08/20 0700 In: 2450 [I.V.:2450] Out: 2600 [Urine:2300; Blood:300] Intake/Output this shift:    Physical exam the patient is alert and oriented x3. He is moving all 4 extremities well. His speech is normal. His dressing has a small bloodstain.  Lab Results:  Recent Labs  01/01/14 0547 01/01/14 2003  WBC 21.8*  --   HGB 11.9* 10.2*  HCT 37.0* 30.0*  PLT 278  --    BMET  Recent Labs  01/01/14 0547 01/01/14 2003  NA 143 138  K 4.6 4.4  CL 106  --   CO2 26  --   GLUCOSE 138*  --   BUN 27*  --   CREATININE 1.70*  --   CALCIUM 9.1  --     Studies/Results: Dg Chest Port 1 View  01/01/2014   CLINICAL DATA:  Central line placement.  EXAM: PORTABLE CHEST - 1 VIEW  COMPARISON:  12/29/2013.  FINDINGS: The left IJ catheter tip is in the proximal SVC. No complicating features. The cardiac silhouette, mediastinal and hilar contours are stable. The lungs are clear.  IMPRESSION: Left IJ catheter tip is in the proximal SVC. No complicating features.   Electronically Signed   By: Kalman Jewels M.D.   On: 01/01/2014 23:28    Assessment/Plan: Postop day #1: The patient is doing well. We will get a MRI scan today.  LOS: 4 days     [name] 01/02/2014, 7:26 AM

## 2014-01-02 NOTE — Progress Notes (Signed)
[facility]                                                                              Patient Demographics  Edward [name], is a 68 y.o. male, DOB - [birth year], WUJ:[id number]  Admit date - 12/29/2013   Admitting Physician Theressa Millard, MD  Outpatient Primary MD for the patient is [name], MD  LOS - 4   Chief Complaint  Patient presents with  . Weakness      HPI on 12/29/2013 Edward [name] is a 68 y.o. male with a history of Renal Cell Carcinoma S/P left Nephrectomy who presented to the ED with complaints of increased falls and difficulty walking over the past 2 weeks, and hesitance of speech. He reported having headaches but denied visual changes, but did have diplopia 1 year ago. He was evaluated in the ED and a CT Scan was performed which revealed 2 Brain Masses, one measuring 3.3 cm x 4.4 cm with associated vasogenic edema, and the second mass at 1.2 cm diameter. A RUL and Paratracheal mass was also seen on his Chest X-ray. He was referred for medical admission.    Assessment & Plan   Brain mass with vasogenic edema -Seen on CT scan: Large mass lesion in the right frontal lobe with a likely second mass in the left frontal lobe.  -Patient noted to have some vasogenic edema, continue Decadron -MRI brain:4.0 x 3.1 x 4.1 cm enhancing mass within the right frontal lobe with associated mass effect  -Neurosurgery consulted and appreciated, s/p craniotomy and plan for MRI today.  -CT Chest/abd/pelvis: No evidence of lung mass, consistent with granulomatous disease, stable changes following left nephrectomy -Oncology consulted and appreciated.   Lung mass -Noted on CXR -However, not on CT chest, changes consistent with prior granulomatous disease  Acute on chronic kidney failure, stage III -Patient s/p renal cell carcinoma s/p Left nephrectomy -Continue IVF  -Creatinine trending downward -Lasix and ACEi held -Continue to monitor BMP in am.    History of Renal Cell carcinoma -2009 -S/p left nephrectomy -Patient did not see an oncologist at that time.  He did not receive chemotherapy or radiation.  Essential hypertension -Continue metoprolol  Hyperlipidemia -Continue statin  Obstructive sleep apnea -Continue CPAP QHS  Tobacco Abuse -Nicotine patch -Smoking cessation counseling  Depression/anxiety -Continue Wellbutrin, Celexa, Klonopin  Nausea/vomiting -Zofran PRN  Leukocytosis; Probably from the decadron. Continue to monitor.   Code Status: Full  Family Communication: None at bedside  Disposition Plan: pending further investigation.   Time Spent in minutes   25 minutes  Procedures  None  Consults   Neurosurgery Oncology  DVT Prophylaxis  Lovenox  Lab Results  Component Value Date   PLT 278 01/01/2014    Medications  Scheduled Meds: . allopurinol  100 mg Oral Daily  . antiseptic oral rinse  7 mL Mouth Rinse BID  . atorvastatin  40 mg Oral Daily  . buPROPion  150 mg Oral Daily  . buPROPion  300 mg Oral Daily  .  ceFAZolin (ANCEF) IV  2 g Intravenous Q8H  . citalopram  20 mg Oral Daily  . clonazePAM  0.5 mg Oral QHS  .  dexamethasone  10 mg Intravenous 4 times per day   Followed by  . [START ON 01/03/2014] dexamethasone  4 mg Intravenous 4 times per day   Followed by  . [START ON 01/04/2014] dexamethasone  4 mg Intravenous 3 times per day  . docusate sodium  100 mg Oral BID  . levETIRAcetam  500 mg Intravenous Q12H  . metoprolol succinate  100 mg Oral Daily  . nicotine  7 mg Transdermal Daily  . pantoprazole (PROTONIX) IV  40 mg Intravenous QHS  . zolpidem  5 mg Oral QHS   Continuous Infusions: . sodium chloride Stopped (01/01/14 2145)  . 0.9 % NaCl with KCl 20 mEq / L 75 mL/hr at 01/02/14 0522   PRN Meds:.acetaminophen, acetaminophen, alum & mag hydroxide-simeth, HYDROcodone-acetaminophen, HYDROmorphone (DILAUDID) injection, labetalol, menthol-cetylpyridinium, morphine injection,  ondansetron (ZOFRAN) IV, ondansetron, oxyCODONE, promethazine  Antibiotics    Anti-infectives   Start     Dose/Rate Route Frequency Ordered Stop   01/02/14 0724  ceFAZolin (ANCEF) IVPB 2 g/50 mL premix     2 g 100 mL/hr over 30 Minutes Intravenous Every 8 hours 01/02/14 0724 01/02/14 2329   01/01/14 1851  bacitracin 50,000 Units in sodium chloride irrigation 0.9 % 500 mL irrigation  Status:  Discontinued       As needed 01/01/14 1855 01/01/14 2201      Subjective:   Edward [name] seen and examined today.  Patient complains of a headache. Heartburn.   Objective:   Filed Vitals:   01/02/14 1128 01/02/14 1200 01/02/14 1300 01/02/14 1400  BP:  149/82 151/72 128/47  Pulse:  72 70 66  Temp: 98.2 F (36.8 C)     TempSrc: Oral     Resp:   19   Height:      Weight:      SpO2:  93% 88% 97%    Wt Readings from Last 3 Encounters:  01/01/14 108.7 kg (239 lb 10.2 oz)  01/01/14 108.7 kg (239 lb 10.2 oz)  10/08/13 105.235 kg (232 lb)     Intake/Output Summary (Last 24 hours) at 01/02/14 1459 Last data filed at 01/02/14 1437  Gross per 24 hour  Intake 3247.5 ml  Output   3095 ml  Net  152.5 ml    Exam  General: Well developed, well nourished, NAD, appears stated age  Cardiovascular: S1 S2 auscultated, no rubs, murmurs or gallops. Regular rate and rhythm.  Respiratory: Clear to auscultation bilaterally with equal chest rise  Abdomen: Soft, nontender, nondistended, + bowel sounds; vomiting  Extremities: warm dry without cyanosis clubbing or edema  Neuro: AAOx3, .    Data Review   Micro Results Recent Results (from the past 240 hour(s))  SURGICAL PCR SCREEN     Status: None   Collection Time    01/01/14  5:09 AM      Result Value Ref Range Status   MRSA, PCR NEGATIVE  NEGATIVE Final   Staphylococcus aureus NEGATIVE  NEGATIVE Final   Comment:            The Xpert SA Assay (FDA     approved for NASAL specimens     in patients over 85 years of age),     is  one component of     a comprehensive surveillance     program.  Test performance has     been validated by [employer] for patients greater     than or equal to 83 year old.  It is not intended     to diagnose infection nor to     guide or monitor treatment.    Radiology Reports Ct Head Wo Contrast  12/29/2013   CLINICAL DATA:  Difficulty walking.  Altered mental status.  EXAM: CT HEAD WITHOUT CONTRAST  TECHNIQUE: Contiguous axial images were obtained from the base of the skull through the vertex without intravenous contrast.  COMPARISON:  None.  FINDINGS: A mass lesion in the right frontal lobe measures 3.3 x 4.4 cm on image 21. There is extensive surrounding vasogenic edema. There may be a second small mass in the left frontal lobe on image 22 measuring 1.2 cm in diameter. No other mass is seen. There is no midline shift or hydrocephalus. No hemorrhage is seen. The calvarium is intact.  IMPRESSION: Large mass lesion in the right frontal lobe with a likely second mass in the left frontal lobe. Brain MRI with and without contrast is recommended for further evaluation.  Critical Value/emergent results were called by telephone at the time of interpretation on 12/29/2013 at 8:46 pm to DAN FLOYD, P.A., who verbally acknowledged these results.   Electronically Signed   By: Inge Rise M.D.   On: 12/29/2013 20:48   Dg Chest Portable 1 View  12/29/2013   CLINICAL DATA:  Weakness.  EXAM: PORTABLE CHEST - 1 VIEW  COMPARISON:  10/08/2013  FINDINGS: The cardiac silhouette, mediastinal and hilar contours are within normal limits and stable. Slight increasingright upper lobe/ right paratracheal density. Chest CT recommended for further evaluation given the patient's history of smoking. Stable calcified granuloma in the left lung. No pleural effusions or pneumothorax. The bony thorax is intact.  IMPRESSION: Increasing right apical density. Recommend chest CT for further evaluation.   Electronically  Signed   By: Kalman Jewels M.D.   On: 12/29/2013 20:25    CBC  Recent Labs Lab 12/29/13 2006 12/29/13 2019 12/30/13 0445 01/01/14 0547 01/01/14 2003  WBC 12.9*  --  12.6* 21.8*  --   HGB 11.7* 12.9* 11.4* 11.9* 10.2*  HCT 36.6* 38.0* 35.6* 37.0* 30.0*  PLT 290  --  269 278  --   MCV 94.1  --  94.2 95.9  --   MCH 30.1  --  30.2 30.8  --   MCHC 32.0  --  32.0 32.2  --   RDW 13.3  --  13.4 13.5  --   LYMPHSABS 2.7  --   --   --   --   MONOABS 1.3*  --   --   --   --   EOSABS 0.3  --   --   --   --   BASOSABS 0.1  --   --   --   --     Chemistries   Recent Labs Lab 12/29/13 2006 12/29/13 2019 12/30/13 0445 01/01/14 0547 01/01/14 2003  NA 138 137 138 143 138  K 4.6 4.4 4.7 4.6 4.4  CL 102 105 104 106  --   CO2 25  --  21 26  --   GLUCOSE 93 97 128* 138*  --   BUN 28* 27* 25* 27*  --   CREATININE 2.18* 2.30* 1.86* 1.70*  --   CALCIUM 9.1  --  8.8 9.1  --   AST 19  --   --   --   --   ALT 18  --   --   --   --   ALKPHOS 122*  --   --   --   --  BILITOT 0.3  --   --   --   --    ------------------------------------------------------------------------------------------------------------------ estimated creatinine clearance is 55.3 ml/min (by C-G formula based on Cr of 1.7). ------------------------------------------------------------------------------------------------------------------ No results found for this basename: HGBA1C,  in the last 72 hours ------------------------------------------------------------------------------------------------------------------ No results found for this basename: CHOL, HDL, LDLCALC, TRIG, CHOLHDL, LDLDIRECT,  in the last 72 hours ------------------------------------------------------------------------------------------------------------------ No results found for this basename: TSH, T4TOTAL, FREET3, T3FREE, THYROIDAB,  in the last 72  hours ------------------------------------------------------------------------------------------------------------------ No results found for this basename: VITAMINB12, FOLATE, FERRITIN, TIBC, IRON, RETICCTPCT,  in the last 72 hours  Coagulation profile  Recent Labs Lab 12/29/13 2006  INR 1.08    No results found for this basename: DDIMER,  in the last 72 hours  Cardiac Enzymes No results found for this basename: CK, CKMB, TROPONINI, MYOGLOBIN,  in the last 168 hours ------------------------------------------------------------------------------------------------------------------ No components found with this basename: POCBNP,     Ky Rumple D.O. on 01/02/2014 at 2:59 PM  Between 7am to 7pm - Pager - [phone]  After 7pm go to [email or web address] - password [email or web address]  And look for the night coverage person covering for me after hours  [facility] Office  [phone]

## 2014-01-02 NOTE — Progress Notes (Signed)
Patient refused CPAP, stating that he rested better without the machine at home.

## 2014-01-02 NOTE — Addendum Note (Signed)
Addendum created 01/02/14 0036 by Roberts Gaudy, MD   Modules edited: Orders

## 2014-01-03 ENCOUNTER — Inpatient Hospital Stay (HOSPITAL_COMMUNITY): Payer: BC Managed Care – PPO

## 2014-01-03 LAB — CBC
HCT: 34.9 % — ABNORMAL LOW (ref 39.0–52.0)
Hemoglobin: 11.3 g/dL — ABNORMAL LOW (ref 13.0–17.0)
MCH: 31.1 pg (ref 26.0–34.0)
MCHC: 32.4 g/dL (ref 30.0–36.0)
MCV: 96.1 fL (ref 78.0–100.0)
PLATELETS: 219 10*3/uL (ref 150–400)
RBC: 3.63 MIL/uL — ABNORMAL LOW (ref 4.22–5.81)
RDW: 13.5 % (ref 11.5–15.5)
WBC: 20.2 10*3/uL — ABNORMAL HIGH (ref 4.0–10.5)

## 2014-01-03 LAB — TYPE AND SCREEN
ABO/RH(D): A POS
ANTIBODY SCREEN: NEGATIVE
UNIT DIVISION: 0
Unit division: 0

## 2014-01-03 LAB — BASIC METABOLIC PANEL
ANION GAP: 9 (ref 5–15)
BUN: 22 mg/dL (ref 6–23)
CALCIUM: 8.1 mg/dL — AB (ref 8.4–10.5)
CO2: 25 mEq/L (ref 19–32)
Chloride: 107 mEq/L (ref 96–112)
Creatinine, Ser: 1.45 mg/dL — ABNORMAL HIGH (ref 0.50–1.35)
GFR calc Af Amer: 56 mL/min — ABNORMAL LOW (ref >90)
GFR, EST NON AFRICAN AMERICAN: 49 mL/min — AB (ref >90)
GLUCOSE: 131 mg/dL — AB (ref 70–99)
Potassium: 4.8 mEq/L (ref 3.7–5.3)
SODIUM: 141 meq/L (ref 137–147)

## 2014-01-03 MED ORDER — GADOBENATE DIMEGLUMINE 529 MG/ML IV SOLN
20.0000 mL | Freq: Once | INTRAVENOUS | Status: AC | PRN
  Administered 2014-01-03: 20 mL via INTRAVENOUS

## 2014-01-03 NOTE — Progress Notes (Signed)
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  This really is no surprise given the fact that he has a solitary brain mass. This mass was causing some mass effect on the brain itself.  He had a repeat MRI done. This does show some residual enhancement of the tumor at the surgical margin.  He continues to do well postop. His vital signs look good. His physical exam was pretty much unchanged.  I will have to call pathology on Monday and have them sent a test to a specialty lab so that the appropriate chromosomal studies can be done and to see if the tumor has the MGMT gene methylated or not. This will be important with respect to adjuvant therapy.  I'll have to remind Radiation Oncology to see him next week. I would think that he probably will be discharged next week.  I would think that adjuvant therapy would probably get started 2 or 3 weeks after he is discharged.  I do feel bad for him in that it will be very difficult for [place] to cure this. We certainly will treat it aggressively.  Pete E.

## 2014-01-03 NOTE — Progress Notes (Signed)
[facility]                                                                              Patient Demographics  Edward [name], is a 68 y.o. male, DOB - [birth year], VVO:[id number]  Admit date - 12/29/2013   Admitting Physician Theressa Millard, MD  Outpatient Primary MD for the patient is [name], MD  LOS - 5   Chief Complaint  Patient presents with  . Weakness      HPI on 12/29/2013 Edward [name] is a 68 y.o. male with a history of Renal Cell Carcinoma S/P left Nephrectomy who presented to the ED with complaints of increased falls and difficulty walking over the past 2 weeks, and hesitance of speech. He reported having headaches but denied visual changes, but did have diplopia 1 year ago. He was evaluated in the ED and a CT Scan was performed which revealed 2 Brain Masses, one measuring 3.3 cm x 4.4 cm with associated vasogenic edema, and the second mass at 1.2 cm diameter. A RUL and Paratracheal mass was also seen on his Chest X-ray. He was referred for medical admission.    Assessment & Plan   Brain mass with vasogenic edema -Seen on CT scan: Large mass lesion in the right frontal lobe with a likely second mass in the left frontal lobe.  -Patient noted to have some vasogenic edema, continue Decadron -MRI brain:4.0 x 3.1 x 4.1 cm enhancing mass within the right frontal lobe with associated mass effect  -Neurosurgery consulted and appreciated, s/p craniotomy , patho showed stage 4 GBM,and plan for MRI today.  -CT Chest/abd/pelvis: No evidence of lung mass, consistent with granulomatous disease, stable changes following left nephrectomy -Oncology consulted and appreciated.   Lung mass -Noted on CXR -However, not on CT chest, changes consistent with prior granulomatous disease  Acute on chronic kidney failure, stage III -Patient s/p renal cell carcinoma s/p Left nephrectomy -Continue IVF  -Creatinine trending downward -Lasix and ACEi held -Continue to  monitor BMP in am.   History of Renal Cell carcinoma -2009 -S/p left nephrectomy -Patient did not see an oncologist at that time.  He did not receive chemotherapy or radiation.  Essential hypertension -Continue metoprolol  Hyperlipidemia -Continue statin  Obstructive sleep apnea -Continue CPAP QHS  Tobacco Abuse -Nicotine patch -Smoking cessation counseling  Depression/anxiety -Continue Wellbutrin, Celexa, Klonopin  Nausea/vomiting -Zofran PRN  Leukocytosis; Probably from the decadron. Continue to monitor.   Code Status: Full  Family Communication: None at bedside  Disposition Plan: pending further investigation.   Time Spent in minutes   25 minutes  Procedures  None  Consults   Neurosurgery Oncology  DVT Prophylaxis  Lovenox  Lab Results  Component Value Date   PLT 219 01/03/2014    Medications  Scheduled Meds: . allopurinol  100 mg Oral Daily  . antiseptic oral rinse  7 mL Mouth Rinse BID  . atorvastatin  40 mg Oral Daily  . buPROPion  150 mg Oral Daily  . buPROPion  300 mg Oral Daily  . citalopram  20 mg Oral Daily  . clonazePAM  0.5 mg Oral QHS  . dexamethasone  4 mg Intravenous 4  times per day   Followed by  . [START ON 01/04/2014] dexamethasone  4 mg Intravenous 3 times per day  . docusate sodium  100 mg Oral BID  . levETIRAcetam  500 mg Intravenous Q12H  . metoprolol succinate  100 mg Oral Daily  . nicotine  7 mg Transdermal Daily  . pantoprazole  40 mg Oral BID AC  . zolpidem  5 mg Oral QHS   Continuous Infusions: . sodium chloride Stopped (01/01/14 2145)  . 0.9 % NaCl with KCl 20 mEq / L 75 mL/hr at 01/03/14 1900   PRN Meds:.acetaminophen, acetaminophen, alum & mag hydroxide-simeth, HYDROcodone-acetaminophen, HYDROmorphone (DILAUDID) injection, labetalol, menthol-cetylpyridinium, morphine injection, ondansetron (ZOFRAN) IV, ondansetron, oxyCODONE, promethazine  Antibiotics    Anti-infectives   Start     Dose/Rate Route Frequency  Ordered Stop   01/02/14 0724  ceFAZolin (ANCEF) IVPB 2 g/50 mL premix     2 g 100 mL/hr over 30 Minutes Intravenous Every 8 hours 01/02/14 0724 01/02/14 1654   01/01/14 1851  bacitracin 50,000 Units in sodium chloride irrigation 0.9 % 500 mL irrigation  Status:  Discontinued       As needed 01/01/14 1855 01/01/14 2201      Subjective:   Edward [name] seen and examined today.  Patient complains of a headache. Heartburn.   Objective:   Filed Vitals:   01/03/14 1815 01/03/14 1830 01/03/14 1845 01/03/14 1900  BP: 186/87 168/81 168/76 160/74  Pulse: 60 60 61 61  Temp:      TempSrc:      Resp: 17 22 15 18   Height:      Weight:      SpO2: 95% 95% 93% 98%    Wt Readings from Last 3 Encounters:  01/01/14 108.7 kg (239 lb 10.2 oz)  01/01/14 108.7 kg (239 lb 10.2 oz)  10/08/13 105.235 kg (232 lb)     Intake/Output Summary (Last 24 hours) at 01/03/14 1927 Last data filed at 01/03/14 1900  Gross per 24 hour  Intake   1725 ml  Output   1050 ml  Net    675 ml    Exam  General: Well developed, well nourished, NAD, appears stated age  Cardiovascular: S1 S2 auscultated, no rubs, murmurs or gallops. Regular rate and rhythm.  Respiratory: Clear to auscultation bilaterally with equal chest rise  Abdomen: Soft, nontender, nondistended, + bowel sounds; vomiting  Extremities: warm dry without cyanosis clubbing or edema  Neuro: AAOx3, .    Data Review   Micro Results Recent Results (from the past 240 hour(s))  SURGICAL PCR SCREEN     Status: None   Collection Time    01/01/14  5:09 AM      Result Value Ref Range Status   MRSA, PCR NEGATIVE  NEGATIVE Final   Staphylococcus aureus NEGATIVE  NEGATIVE Final   Comment:            The Xpert SA Assay (FDA     approved for NASAL specimens     in patients over 9 years of age),     is one component of     a comprehensive surveillance     program.  Test performance has     been validated by [employer] for patients  greater     than or equal to 64 year old.     It is not intended     to diagnose infection nor to     guide or monitor  treatment.    Radiology Reports Ct Head Wo Contrast  12/29/2013   CLINICAL DATA:  Difficulty walking.  Altered mental status.  EXAM: CT HEAD WITHOUT CONTRAST  TECHNIQUE: Contiguous axial images were obtained from the base of the skull through the vertex without intravenous contrast.  COMPARISON:  None.  FINDINGS: A mass lesion in the right frontal lobe measures 3.3 x 4.4 cm on image 21. There is extensive surrounding vasogenic edema. There may be a second small mass in the left frontal lobe on image 22 measuring 1.2 cm in diameter. No other mass is seen. There is no midline shift or hydrocephalus. No hemorrhage is seen. The calvarium is intact.  IMPRESSION: Large mass lesion in the right frontal lobe with a likely second mass in the left frontal lobe. Brain MRI with and without contrast is recommended for further evaluation.  Critical Value/emergent results were called by telephone at the time of interpretation on 12/29/2013 at 8:46 pm to DAN FLOYD, P.A., who verbally acknowledged these results.   Electronically Signed   By: Inge Rise M.D.   On: 12/29/2013 20:48   Dg Chest Portable 1 View  12/29/2013   CLINICAL DATA:  Weakness.  EXAM: PORTABLE CHEST - 1 VIEW  COMPARISON:  10/08/2013  FINDINGS: The cardiac silhouette, mediastinal and hilar contours are within normal limits and stable. Slight increasingright upper lobe/ right paratracheal density. Chest CT recommended for further evaluation given the patient's history of smoking. Stable calcified granuloma in the left lung. No pleural effusions or pneumothorax. The bony thorax is intact.  IMPRESSION: Increasing right apical density. Recommend chest CT for further evaluation.   Electronically Signed   By: Kalman Jewels M.D.   On: 12/29/2013 20:25    CBC  Recent Labs Lab 12/29/13 2006 12/29/13 2019 12/30/13 0445  01/01/14 0547 01/01/14 2003 01/03/14 0530  WBC 12.9*  --  12.6* 21.8*  --  20.2*  HGB 11.7* 12.9* 11.4* 11.9* 10.2* 11.3*  HCT 36.6* 38.0* 35.6* 37.0* 30.0* 34.9*  PLT 290  --  269 278  --  219  MCV 94.1  --  94.2 95.9  --  96.1  MCH 30.1  --  30.2 30.8  --  31.1  MCHC 32.0  --  32.0 32.2  --  32.4  RDW 13.3  --  13.4 13.5  --  13.5  LYMPHSABS 2.7  --   --   --   --   --   MONOABS 1.3*  --   --   --   --   --   EOSABS 0.3  --   --   --   --   --   BASOSABS 0.1  --   --   --   --   --     Chemistries   Recent Labs Lab 12/29/13 2006 12/29/13 2019 12/30/13 0445 01/01/14 0547 01/01/14 2003 01/03/14 0530  NA 138 137 138 143 138 141  K 4.6 4.4 4.7 4.6 4.4 4.8  CL 102 105 104 106  --  107  CO2 25  --  21 26  --  25  GLUCOSE 93 97 128* 138*  --  131*  BUN 28* 27* 25* 27*  --  22  CREATININE 2.18* 2.30* 1.86* 1.70*  --  1.45*  CALCIUM 9.1  --  8.8 9.1  --  8.1*  AST 19  --   --   --   --   --   ALT 18  --   --   --   --   --  ALKPHOS 122*  --   --   --   --   --   BILITOT 0.3  --   --   --   --   --    ------------------------------------------------------------------------------------------------------------------ estimated creatinine clearance is 64.8 ml/min (by C-G formula based on Cr of 1.45). ------------------------------------------------------------------------------------------------------------------ No results found for this basename: HGBA1C,  in the last 72 hours ------------------------------------------------------------------------------------------------------------------ No results found for this basename: CHOL, HDL, LDLCALC, TRIG, CHOLHDL, LDLDIRECT,  in the last 72 hours ------------------------------------------------------------------------------------------------------------------ No results found for this basename: TSH, T4TOTAL, FREET3, T3FREE, THYROIDAB,  in the last 72  hours ------------------------------------------------------------------------------------------------------------------ No results found for this basename: VITAMINB12, FOLATE, FERRITIN, TIBC, IRON, RETICCTPCT,  in the last 72 hours  Coagulation profile  Recent Labs Lab 12/29/13 2006  INR 1.08    No results found for this basename: DDIMER,  in the last 72 hours  Cardiac Enzymes No results found for this basename: CK, CKMB, TROPONINI, MYOGLOBIN,  in the last 168 hours ------------------------------------------------------------------------------------------------------------------ No components found with this basename: POCBNP,     Anessa Charley D.O. on 01/03/2014 at 7:27 PM  Between 7am to 7pm - Pager - [phone]  After 7pm go to [email or web address] - password [email or web address]  And look for the night coverage person covering for me after hours  [facility] Office  [phone]

## 2014-01-03 NOTE — Progress Notes (Signed)
Dr. Arnoldo Morale aware of patient not able to get MRI done.

## 2014-01-03 NOTE — Progress Notes (Signed)
Patient ID: Edward [name], male   DOB: September 13, 1945, 68 y.o.   MRN: [id number] Subjective:  The patient is alert and pleasant. He feels "good". He has no complaints. His nausea is better this morning.  Objective: Vital signs in last 24 hours: Temp:  [98.1 F (36.7 C)-99 F (37.2 C)] 98.6 F (37 C) (08/21 0326) Pulse Rate:  [53-77] 53 (08/21 0500) Resp:  [10-19] 10 (08/21 0500) BP: (124-152)/(47-82) 133/73 mmHg (08/21 0500) SpO2:  [88 %-100 %] 98 % (08/21 0500) Arterial Line BP: (152)/(62) 152/62 mmHg (08/20 0800)  Intake/Output from previous day: 08/20 0701 - 08/21 0700 In: 1647.5 [I.V.:1397.5; IV Piggyback:250] Out: 695 [Urine:695] Intake/Output this shift:    Physical exam the patient is alert and oriented x3, Glasgow Coma Scale 15. His strength is normal bilaterally. His speech is normal. The patient's pupils are approximately 4 mm and reactive bilaterally.  Lab Results:  Recent Labs  01/01/14 0547 01/01/14 2003 01/03/14 0530  WBC 21.8*  --  20.2*  HGB 11.9* 10.2* 11.3*  HCT 37.0* 30.0* 34.9*  PLT 278  --  219   BMET  Recent Labs  01/01/14 0547 01/01/14 2003 01/03/14 0530  NA 143 138 141  K 4.6 4.4 4.8  CL 106  --  107  CO2 26  --  25  GLUCOSE 138*  --  131*  BUN 27*  --  22  CREATININE 1.70*  --  1.45*  CALCIUM 9.1  --  8.1*    Studies/Results: Dg Chest Port 1 View  01/01/2014   CLINICAL DATA:  Central line placement.  EXAM: PORTABLE CHEST - 1 VIEW  COMPARISON:  12/29/2013.  FINDINGS: The left IJ catheter tip is in the proximal SVC. No complicating features. The cardiac silhouette, mediastinal and hilar contours are stable. The lungs are clear.  IMPRESSION: Left IJ catheter tip is in the proximal SVC. No complicating features.   Electronically Signed   By: Kalman Jewels M.D.   On: 01/01/2014 23:28    Assessment/Plan: Postop day #2: We are awaiting the patient's brain MRI. It was not done yesterday. He may be able to go home over this weekend.  Pathology is pending.  LOS: 5 days     Kathlyn Leachman D 01/03/2014, 7:14 AM

## 2014-01-04 MED ORDER — OMEPRAZOLE 20 MG PO CPDR
40.0000 mg | DELAYED_RELEASE_CAPSULE | Freq: Two times a day (BID) | ORAL | Status: DC
  Administered 2014-01-05 – 2014-01-06 (×3): 40 mg via ORAL
  Filled 2014-01-04 (×6): qty 2

## 2014-01-04 NOTE — Progress Notes (Signed)
Patient trans in from 3MW. Alert and oriented. Dressing to Rt Side of head CDI. Condom catheter in situ. Patient oriented to room and call bell placed within reach.

## 2014-01-04 NOTE — Progress Notes (Signed)
[facility]                                                                              Patient Demographics  Edward [name], is a 68 y.o. male, DOB - October 19, 1945, AST:[id number]  Admit date - 12/29/2013   Admitting Physician Theressa Millard, MD  Outpatient Primary MD for the patient is [name], MD  LOS - 6   Chief Complaint  Patient presents with  . Weakness      HPI on 12/29/2013 Edward [name] is a 68 y.o. male with a history of Renal Cell Carcinoma S/P left Nephrectomy who presented to the ED with complaints of increased falls and difficulty walking over the past 2 weeks, and hesitance of speech. He reported having headaches but denied visual changes, but did have diplopia 1 year ago. He was evaluated in the ED and a CT Scan was performed which revealed 2 Brain Masses, one measuring 3.3 cm x 4.4 cm with associated vasogenic edema, and the second mass at 1.2 cm diameter. A RUL and Paratracheal mass was also seen on his Chest X-ray. He was referred for medical admission.    Assessment & Plan   Brain mass with vasogenic edema -Seen on CT scan: Large mass lesion in the right frontal lobe with a likely second mass in the left frontal lobe.  -Patient noted to have some vasogenic edema, continue Decadron -MRI brain:4.0 x 3.1 x 4.1 cm enhancing mass within the right frontal lobe with associated mass effect  -Neurosurgery consulted and appreciated, s/p craniotomy , patho showed stage 4 GBM,and rpeat MRI done last night.  -CT Chest/abd/pelvis: No evidence of lung mass, consistent with granulomatous disease, stable changes following left nephrectomy -Oncology consulted and appreciated.   Lung mass -Noted on CXR -However, not on CT chest, changes consistent with prior granulomatous disease  Acute on chronic kidney failure, stage III -Patient s/p renal cell carcinoma s/p Left nephrectomy -Continue IVF  -Creatinine trending downward -Lasix and ACEi held,  improving.     History of Renal Cell carcinoma -2009 -S/p left nephrectomy -Patient did not see an oncologist at that time.  He did not receive chemotherapy or radiation.  Essential hypertension -Continue metoprolol  Hyperlipidemia -Continue statin  Obstructive sleep apnea -Continue CPAP QHS  Tobacco Abuse -Nicotine patch -Smoking cessation counseling  Depression/anxiety -Continue Wellbutrin, Celexa, Klonopin  Nausea/vomiting -Zofran PRN  Leukocytosis; Probably from the decadron. Continue to monitor.   Code Status: Full  Family Communication: None at bedside  Disposition Plan: pending further investigation.   Time Spent in minutes   25 minutes  Procedures  None  Consults   Neurosurgery Oncology  DVT Prophylaxis  Lovenox  Lab Results  Component Value Date   PLT 219 01/03/2014    Medications  Scheduled Meds: . allopurinol  100 mg Oral Daily  . antiseptic oral rinse  7 mL Mouth Rinse BID  . atorvastatin  40 mg Oral Daily  . buPROPion  150 mg Oral Daily  . buPROPion  300 mg Oral Daily  . citalopram  20 mg Oral Daily  . clonazePAM  0.5 mg Oral QHS  . dexamethasone  4 mg Intravenous 3 times per  day  . docusate sodium  100 mg Oral BID  . levETIRAcetam  500 mg Intravenous Q12H  . metoprolol succinate  100 mg Oral Daily  . nicotine  7 mg Transdermal Daily  . omeprazole  40 mg Oral BID AC  . zolpidem  5 mg Oral QHS   Continuous Infusions: . sodium chloride Stopped (01/01/14 2145)  . 0.9 % NaCl with KCl 20 mEq / L 75 mL/hr at 01/04/14 0315   PRN Meds:.acetaminophen, acetaminophen, alum & mag hydroxide-simeth, HYDROcodone-acetaminophen, HYDROmorphone (DILAUDID) injection, labetalol, menthol-cetylpyridinium, morphine injection, ondansetron (ZOFRAN) IV, ondansetron, oxyCODONE, promethazine  Antibiotics    Anti-infectives   Start     Dose/Rate Route Frequency Ordered Stop   01/02/14 0724  ceFAZolin (ANCEF) IVPB 2 g/50 mL premix     2 g 100 mL/hr over  30 Minutes Intravenous Every 8 hours 01/02/14 0724 01/02/14 1654   01/01/14 1851  bacitracin 50,000 Units in sodium chloride irrigation 0.9 % 500 mL irrigation  Status:  Discontinued       As needed 01/01/14 1855 01/01/14 2201      Subjective:   Edward [name] seen and examined today.  Patient complains of a headache. Heartburn. Discussed with the family the results of the pathology.   Objective:   Filed Vitals:   01/04/14 0809 01/04/14 1000 01/04/14 1211 01/04/14 1350  BP:  137/64  113/54  Pulse:  53  63  Temp: 97.7 F (36.5 C)  97.7 F (36.5 C) 98.2 F (36.8 C)  TempSrc: Oral  Axillary Oral  Resp:  15  20  Height:      Weight:      SpO2:  99%  98%    Wt Readings from Last 3 Encounters:  01/01/14 108.7 kg (239 lb 10.2 oz)  01/01/14 108.7 kg (239 lb 10.2 oz)  10/08/13 105.235 kg (232 lb)     Intake/Output Summary (Last 24 hours) at 01/04/14 1719 Last data filed at 01/04/14 1300  Gross per 24 hour  Intake   1930 ml  Output   1610 ml  Net    320 ml    Exam  General: Well developed, well nourished, NAD, appears stated age  Cardiovascular: S1 S2 auscultated, no rubs, murmurs or gallops. Regular rate and rhythm.  Respiratory: Clear to auscultation bilaterally with equal chest rise  Abdomen: Soft, nontender, nondistended, + bowel sounds; vomiting  Extremities: warm dry without cyanosis clubbing or edema  Neuro: AAOx3, .    Data Review   Micro Results Recent Results (from the past 240 hour(s))  SURGICAL PCR SCREEN     Status: None   Collection Time    01/01/14  5:09 AM      Result Value Ref Range Status   MRSA, PCR NEGATIVE  NEGATIVE Final   Staphylococcus aureus NEGATIVE  NEGATIVE Final   Comment:            The Xpert SA Assay (FDA     approved for NASAL specimens     in patients over 76 years of age),     is one component of     a comprehensive surveillance     program.  Test performance has     been validated by [employer] for patients  greater     than or equal to 83 year old.     It is not intended     to diagnose infection nor to     guide or monitor treatment.  Radiology Reports Ct Head Wo Contrast  12/29/2013   CLINICAL DATA:  Difficulty walking.  Altered mental status.  EXAM: CT HEAD WITHOUT CONTRAST  TECHNIQUE: Contiguous axial images were obtained from the base of the skull through the vertex without intravenous contrast.  COMPARISON:  None.  FINDINGS: A mass lesion in the right frontal lobe measures 3.3 x 4.4 cm on image 21. There is extensive surrounding vasogenic edema. There may be a second small mass in the left frontal lobe on image 22 measuring 1.2 cm in diameter. No other mass is seen. There is no midline shift or hydrocephalus. No hemorrhage is seen. The calvarium is intact.  IMPRESSION: Large mass lesion in the right frontal lobe with a likely second mass in the left frontal lobe. Brain MRI with and without contrast is recommended for further evaluation.  Critical Value/emergent results were called by telephone at the time of interpretation on 12/29/2013 at 8:46 pm to DAN FLOYD, P.A., who verbally acknowledged these results.   Electronically Signed   By: Inge Rise M.D.   On: 12/29/2013 20:48   Dg Chest Portable 1 View  12/29/2013   CLINICAL DATA:  Weakness.  EXAM: PORTABLE CHEST - 1 VIEW  COMPARISON:  10/08/2013  FINDINGS: The cardiac silhouette, mediastinal and hilar contours are within normal limits and stable. Slight increasingright upper lobe/ right paratracheal density. Chest CT recommended for further evaluation given the patient's history of smoking. Stable calcified granuloma in the left lung. No pleural effusions or pneumothorax. The bony thorax is intact.  IMPRESSION: Increasing right apical density. Recommend chest CT for further evaluation.   Electronically Signed   By: Kalman Jewels M.D.   On: 12/29/2013 20:25    CBC  Recent Labs Lab 12/29/13 2006 12/29/13 2019 12/30/13 0445  01/01/14 0547 01/01/14 2003 01/03/14 0530  WBC 12.9*  --  12.6* 21.8*  --  20.2*  HGB 11.7* 12.9* 11.4* 11.9* 10.2* 11.3*  HCT 36.6* 38.0* 35.6* 37.0* 30.0* 34.9*  PLT 290  --  269 278  --  219  MCV 94.1  --  94.2 95.9  --  96.1  MCH 30.1  --  30.2 30.8  --  31.1  MCHC 32.0  --  32.0 32.2  --  32.4  RDW 13.3  --  13.4 13.5  --  13.5  LYMPHSABS 2.7  --   --   --   --   --   MONOABS 1.3*  --   --   --   --   --   EOSABS 0.3  --   --   --   --   --   BASOSABS 0.1  --   --   --   --   --     Chemistries   Recent Labs Lab 12/29/13 2006 12/29/13 2019 12/30/13 0445 01/01/14 0547 01/01/14 2003 01/03/14 0530  NA 138 137 138 143 138 141  K 4.6 4.4 4.7 4.6 4.4 4.8  CL 102 105 104 106  --  107  CO2 25  --  21 26  --  25  GLUCOSE 93 97 128* 138*  --  131*  BUN 28* 27* 25* 27*  --  22  CREATININE 2.18* 2.30* 1.86* 1.70*  --  1.45*  CALCIUM 9.1  --  8.8 9.1  --  8.1*  AST 19  --   --   --   --   --   ALT 18  --   --   --   --   --  ALKPHOS 122*  --   --   --   --   --   BILITOT 0.3  --   --   --   --   --    ------------------------------------------------------------------------------------------------------------------ estimated creatinine clearance is 64.8 ml/min (by C-G formula based on Cr of 1.45). ------------------------------------------------------------------------------------------------------------------ No results found for this basename: HGBA1C,  in the last 72 hours ------------------------------------------------------------------------------------------------------------------ No results found for this basename: CHOL, HDL, LDLCALC, TRIG, CHOLHDL, LDLDIRECT,  in the last 72 hours ------------------------------------------------------------------------------------------------------------------ No results found for this basename: TSH, T4TOTAL, FREET3, T3FREE, THYROIDAB,  in the last 72  hours ------------------------------------------------------------------------------------------------------------------ No results found for this basename: VITAMINB12, FOLATE, FERRITIN, TIBC, IRON, RETICCTPCT,  in the last 72 hours  Coagulation profile  Recent Labs Lab 12/29/13 2006  INR 1.08    No results found for this basename: DDIMER,  in the last 72 hours  Cardiac Enzymes No results found for this basename: CK, CKMB, TROPONINI, MYOGLOBIN,  in the last 168 hours ------------------------------------------------------------------------------------------------------------------ No components found with this basename: POCBNP,     Edward [name] D.O. on 01/04/2014 at 5:19 PM  Between 7am to 7pm - Pager - [phone]  After 7pm go to [email or web address] - password [email or web address]  And look for the night coverage person covering for me after hours  [facility] Office  [phone]

## 2014-01-04 NOTE — Progress Notes (Signed)
Patient refused CPAP tonight. There Isn't a machine in the room at this time. RN aware. Explained to Patient that if they changed their mind, to just have the RN call Respiratory and we would come set them up. 

## 2014-01-04 NOTE — Progress Notes (Signed)
Subjective: Patient reports Patient doing well no significant headache or nausea  Objective: Vital signs in last 24 hours: Temp:  [97.7 F (36.5 C)-98.5 F (36.9 C)] 98.2 F (36.8 C) (08/22 0327) Pulse Rate:  [51-72] 56 (08/22 0700) Resp:  [10-22] 14 (08/22 0700) BP: (92-190)/(49-92) 147/69 mmHg (08/22 0700) SpO2:  [86 %-100 %] 86 % (08/22 0700)  Intake/Output from previous day: 08/21 0701 - 08/22 0700 In: 1775 [I.V.:1575; IV Piggyback:200] Out: 1525 [Urine:1525] Intake/Output this shift:    Awake alert oriented strength out of 5  Lab Results:  Recent Labs  01/01/14 2003 01/03/14 0530  WBC  --  20.2*  HGB 10.2* 11.3*  HCT 30.0* 34.9*  PLT  --  219   BMET  Recent Labs  01/01/14 2003 01/03/14 0530  NA 138 141  K 4.4 4.8  CL  --  107  CO2  --  25  GLUCOSE  --  131*  BUN  --  22  CREATININE  --  1.45*  CALCIUM  --  8.1*    Studies/Results: Mr Jeri Cos Wo Contrast  01/03/2014   CLINICAL DATA:  Right frontal glioblastoma. Postop resection 01/01/2014.  EXAM: MRI HEAD WITHOUT AND WITH CONTRAST  TECHNIQUE: Multiplanar, multiecho pulse sequences of the brain and surrounding structures were obtained without and with intravenous contrast.  CONTRAST:  67mL MULTIHANCE GADOBENATE DIMEGLUMINE 529 MG/ML IV SOLN  COMPARISON:  MRI 12/30/2013  FINDINGS: Right frontal craniotomy for tumor resection. Large necrotic tumor in the right frontal lobe has been partially resected. Blood products are present within the surgical cavity and surrounding tissues. Improvement in edema and mass-effect on the right frontal horn compared with the preop MRI. Small area of restricted diffusion along the inferior margin of the tumor may represent a small area of infarction.  Early stages of methemoglobin are present in the tumor. This is related to 48 hr since the surgical resection. There is enhancement of residual tumor along the anterior, medial, and posterior margins of the surgical cavity. There is  mass-effect on the right frontal horn which is improved. No shift of the midline structures. Tumor/edema is present in the corpus callosum. Corpus callosum does not show abnormal enhancement.  No other enhancing lesions are identified.  IMPRESSION: Successful debulking and resection of right frontal glioblastoma. Improvement in edema and mass-effect since the preop study. There is blood in the surgical cavity and surrounding tissues. There is residual enhancing tumor surrounding the surgical cavity.   Electronically Signed   By: Franchot Gallo M.D.   On: 01/03/2014 21:06    Assessment/Plan: Repeat MRI looks good with significant debulking of tumor possibly some residual tumor along the inferomedial bed in the area of the corpus callosum and pericallosal arteries  LOS: 6 days     Mackenzye Mackel P 01/04/2014, 8:01 AM

## 2014-01-04 NOTE — Progress Notes (Signed)
Patient refuses CPAP therapy at this time due to bandages on head.  Patient encouraged to notify if he decides to wear.

## 2014-01-05 MED ORDER — DEXAMETHASONE 4 MG PO TABS
4.0000 mg | ORAL_TABLET | Freq: Three times a day (TID) | ORAL | Status: DC
  Administered 2014-01-05 – 2014-01-06 (×4): 4 mg via ORAL
  Filled 2014-01-05 (×4): qty 1

## 2014-01-05 MED ORDER — SODIUM CHLORIDE 0.9 % IJ SOLN
10.0000 mL | INTRAMUSCULAR | Status: DC | PRN
  Administered 2014-01-06: 20 mL

## 2014-01-05 NOTE — Evaluation (Signed)
Physical Therapy Evaluation Patient Details Name: Edward [name] MRN: [id number] DOB: March 23, 1946 Today's Date: 01/05/2014   History of Present Illness  Edward [name] is a 68 y.o. male with a history of Renal Cell Carcinoma S/P left Nephrectomy who presents to the ED with complaints of increased falls and difficulty walking over the past 2 weeks, and hesitance of speech. He reports having headaches but denies visual changes, but He does report that he did have diplopia 1 year ago. MRI confirmed brain mass x2. Pt underwent craniotomy for tumor excision on 01/01/14.  Clinical Impression  Pt admitted with mild balance impairment. Pt currently with functional limitations due to the deficits listed below (see PT Problem List). Pt with score of 44 on Berg Balance scale, indicative of increased fall risk.  Pt will benefit from skilled PT to increase their independence and safety with mobility to allow discharge to the venue listed below. Highly recommend outpt PT for balance. PT will continue to follow.       Follow Up Recommendations Outpatient PT;Supervision - Intermittent    Equipment Recommendations  None recommended by PT    Recommendations for Other Services       Precautions / Restrictions Precautions Precautions: Fall Precaution Comments: crani Restrictions Weight Bearing Restrictions: No      Mobility  Bed Mobility               General bed mobility comments: pt received in chair  Transfers Overall transfer level: Modified independent Equipment used: None                Ambulation/Gait Ambulation/Gait assistance: Min guard Ambulation Distance (Feet): 300 Feet Assistive device: None Gait Pattern/deviations: Step-through pattern;Decreased stride length Gait velocity: decreased   General Gait Details: cautious gait with mild favoring of left side, no overt LOB and no AD needed  Stairs            Wheelchair Mobility    Modified Rankin  (Stroke Patients Only)       Balance Overall balance assessment: Needs assistance Sitting-balance support: No upper extremity supported;Feet supported Sitting balance-Leahy Scale: Normal     Standing balance support: No upper extremity supported;During functional activity Standing balance-Leahy Scale: Good Standing balance comment: limitations evident with dynamic activity such as tandem stance and SL stance                 Standardized Balance Assessment Standardized Balance Assessment : Berg Balance Test Berg Balance Test Sit to Stand: Able to stand without using hands and stabilize independently Standing Unsupported: Able to stand safely 2 minutes Sitting with Back Unsupported but Feet Supported on Floor or Stool: Able to sit safely and securely 2 minutes Stand to Sit: Sits safely with minimal use of hands Transfers: Able to transfer safely, minor use of hands Standing Unsupported with Eyes Closed: Able to stand 10 seconds safely Standing Ubsupported with Feet Together: Able to place feet together independently and stand for 1 minute with supervision From Standing, Reach Forward with Outstretched Arm: Can reach forward >12 cm safely (5") From Standing Position, Pick up Object from Floor: Able to pick up shoe, needs supervision From Standing Position, Turn to Look Behind Over each Shoulder: Looks behind from both sides and weight shifts well Turn 360 Degrees: Able to turn 360 degrees safely but slowly Standing Unsupported, Alternately Place Feet on Step/Stool: Able to complete 4 steps without aid or supervision Standing Unsupported, One Foot in Front: Able to take small step independently and  hold 30 seconds Standing on One Leg: Tries to lift leg/unable to hold 3 seconds but remains standing independently Total Score: 44         Pertinent Vitals/Pain Pain Assessment: No/denies pain    Home Living Family/patient expects to be discharged to:: Private residence Living  Arrangements: Spouse/significant other Available Help at Discharge: Family;Available PRN/intermittently Type of Home: House       Home Layout: Two level;Able to live on main level with bedroom/bathroom Home Equipment: Gilford Rile - 2 wheels Additional Comments: pt retired, wife works full time. 4 yo daughter. main level of home handicap accessible due to pt's mother-in-law living with them before passing. Have a lot of her equipment too: RW, pulse ox, chair lift in garage    Prior Function Level of Independence: Independent         Comments: pt has h/o back injury and thought it was flare-up of back when he began having gait abnormality     Hand Dominance        Extremity/Trunk Assessment   Upper Extremity Assessment: Overall WFL for tasks assessed;Defer to OT evaluation           Lower Extremity Assessment: LLE deficits/detail   LLE Deficits / Details: WFL strength but slightly decreased coordination and balance left side  Cervical / Trunk Assessment: Normal  Communication   Communication: No difficulties  Cognition Arousal/Alertness: Awake/alert Behavior During Therapy: Flat affect Overall Cognitive Status: Within Functional Limits for tasks assessed                      General Comments      Exercises        Assessment/Plan    PT Assessment Patient needs continued PT services  PT Diagnosis Abnormality of gait   PT Problem List Decreased balance;Decreased mobility;Decreased coordination;Decreased knowledge of precautions  PT Treatment Interventions Gait training;Stair training;Functional mobility training;Therapeutic activities;Therapeutic exercise;Balance training;Patient/family education   PT Goals (Current goals can be found in the Care Plan section) Acute Rehab PT Goals Patient Stated Goal: return home PT Goal Formulation: With patient Time For Goal Achievement: 01/19/14 Potential to Achieve Goals: Good    Frequency Min 4X/week   Barriers  to discharge Decreased caregiver support will be home alone during day but reports he has friends and neighbors that could take himt o OP PT    Co-evaluation               End of Session Equipment Utilized During Treatment: Gait belt Activity Tolerance: Patient tolerated treatment well Patient left: in chair;with chair alarm set;with family/visitor present;with call bell/phone within reach Nurse Communication: Mobility status         Time: 8185-6314 PT Time Calculation (min): 49 min   Charges:   PT Evaluation $Initial PT Evaluation Tier I: 1 Procedure PT Treatments $Gait Training: 23-37 mins $Therapeutic Activity: 8-22 mins   PT G Codes:         Leighton Roach, PT  Acute Rehab Services  [phone]  Leighton Roach 01/05/2014, 3:35 PM

## 2014-01-05 NOTE — Progress Notes (Signed)
Entered patients room to place on CPAP and he refused to wear. RT informed patient if he changed his mind and felt as though he wanted to wear it later to let RT know.

## 2014-01-05 NOTE — Progress Notes (Signed)
[facility]                                                                              Patient Demographics  Edward [name], is a 68 y.o. male, DOB - 03-Jun-1945, ZRA:[id number]  Admit date - 12/29/2013   Admitting Physician Theressa Millard, MD  Outpatient Primary MD for the patient is [name], MD  LOS - 7   Chief Complaint  Patient presents with  . Weakness      HPI on 12/29/2013 Edward [name] is a 68 y.o. male with a history of Renal Cell Carcinoma S/P left Nephrectomy who presented to the ED with complaints of increased falls and difficulty walking over the past 2 weeks, and hesitance of speech. He reported having headaches but denied visual changes, but did have diplopia 1 year ago. He was evaluated in the ED and a CT Scan was performed which revealed 2 Brain Masses, one measuring 3.3 cm x 4.4 cm with associated vasogenic edema, and the second mass at 1.2 cm diameter. A RUL and Paratracheal mass was also seen on his Chest X-ray. He was referred for medical admission.    Assessment & Plan   Brain mass with vasogenic edema -Seen on CT scan: Large mass lesion in the right frontal lobe with a likely second mass in the left frontal lobe.  -Patient noted to have some vasogenic edema, continue Decadron -MRI brain:4.0 x 3.1 x 4.1 cm enhancing mass within the right frontal lobe with associated mass effect  -Neurosurgery consulted and appreciated, s/p craniotomy , patho showed stage 4 GBM,and rpeat MRI done last night.  -CT Chest/abd/pelvis: No evidence of lung mass, consistent with granulomatous disease, stable changes following left nephrectomy -Oncology consulted and appreciated.   Lung mass -Noted on CXR -However, not on CT chest, changes consistent with prior granulomatous disease  Acute on chronic kidney failure, stage III -Patient s/p renal cell carcinoma s/p Left nephrectomy -Continue IVF  -Creatinine trending downward -Lasix and ACEi held,  improving.     History of Renal Cell carcinoma -2009 -S/p left nephrectomy -Patient did not see an oncologist at that time.  He did not receive chemotherapy or radiation.  Essential hypertension -Continue metoprolol  Hyperlipidemia -Continue statin  Obstructive sleep apnea -Continue CPAP QHS  Tobacco Abuse -Nicotine patch -Smoking cessation counseling  Depression/anxiety -Continue Wellbutrin, Celexa, Klonopin  Nausea/vomiting -Zofran PRN  Leukocytosis; Probably from the decadron. Continue to monitor.   Code Status: Full  Family Communication: None at bedside  Disposition Plan: plan for discharge in am.   Time Spent in minutes   15 minutes  Procedures  None  Consults   Neurosurgery Oncology  DVT Prophylaxis  Lovenox  Lab Results  Component Value Date   PLT 219 01/03/2014    Medications  Scheduled Meds: . allopurinol  100 mg Oral Daily  . antiseptic oral rinse  7 mL Mouth Rinse BID  . atorvastatin  40 mg Oral Daily  . buPROPion  150 mg Oral Daily  . buPROPion  300 mg Oral Daily  . citalopram  20 mg Oral Daily  . clonazePAM  0.5 mg Oral QHS  . dexamethasone  4 mg Intravenous 3  times per day  . docusate sodium  100 mg Oral BID  . levETIRAcetam  500 mg Intravenous Q12H  . metoprolol succinate  100 mg Oral Daily  . nicotine  7 mg Transdermal Daily  . omeprazole  40 mg Oral BID AC  . zolpidem  5 mg Oral QHS   Continuous Infusions: . sodium chloride Stopped (01/01/14 2145)  . 0.9 % NaCl with KCl 20 mEq / L 75 mL/hr at 01/04/14 0315   PRN Meds:.acetaminophen, acetaminophen, alum & mag hydroxide-simeth, HYDROcodone-acetaminophen, HYDROmorphone (DILAUDID) injection, labetalol, menthol-cetylpyridinium, morphine injection, ondansetron (ZOFRAN) IV, ondansetron, oxyCODONE, promethazine  Antibiotics    Anti-infectives   Start     Dose/Rate Route Frequency Ordered Stop   01/02/14 0724  ceFAZolin (ANCEF) IVPB 2 g/50 mL premix     2 g 100 mL/hr over 30  Minutes Intravenous Every 8 hours 01/02/14 0724 01/02/14 1654   01/01/14 1851  bacitracin 50,000 Units in sodium chloride irrigation 0.9 % 500 mL irrigation  Status:  Discontinued       As needed 01/01/14 1855 01/01/14 2201      Subjective:   Earley Favor seen and examined today.  Patient complains of a headache. Heartburn. Discussed with the family the results of the pathology.   Objective:   Filed Vitals:   01/04/14 2140 01/05/14 0108 01/05/14 0548 01/05/14 0945  BP: 123/63 130/71 118/89 106/57  Pulse: 72 69 66 63  Temp: 97.4 F (36.3 C) 98 F (36.7 C) 98.2 F (36.8 C) 97.5 F (36.4 C)  TempSrc: Oral Oral Oral Axillary  Resp: 20 20 20 16   Height:      Weight:      SpO2: 98% 96% 98% 98%    Wt Readings from Last 3 Encounters:  01/01/14 108.7 kg (239 lb 10.2 oz)  01/01/14 108.7 kg (239 lb 10.2 oz)  10/08/13 105.235 kg (232 lb)     Intake/Output Summary (Last 24 hours) at 01/05/14 1431 Last data filed at 01/05/14 0900  Gross per 24 hour  Intake    480 ml  Output      0 ml  Net    480 ml    Exam  General: Well developed, well nourished, NAD, appears stated age  Cardiovascular: S1 S2 auscultated, no rubs, murmurs or gallops. Regular rate and rhythm.  Respiratory: Clear to auscultation bilaterally with equal chest rise  Abdomen: Soft, nontender, nondistended, + bowel sounds; vomiting  Extremities: warm dry without cyanosis clubbing or edema  Neuro: AAOx3, .    Data Review   Micro Results Recent Results (from the past 240 hour(s))  SURGICAL PCR SCREEN     Status: None   Collection Time    01/01/14  5:09 AM      Result Value Ref Range Status   MRSA, PCR NEGATIVE  NEGATIVE Final   Staphylococcus aureus NEGATIVE  NEGATIVE Final   Comment:            The Xpert SA Assay (FDA     approved for NASAL specimens     in patients over 45 years of age),     is one component of     a comprehensive surveillance     program.  Test performance has     been  validated by [employer] for patients greater     than or equal to 51 year old.     It is not intended     to diagnose infection nor to  guide or monitor treatment.    Radiology Reports Ct Head Wo Contrast  12/29/2013   CLINICAL DATA:  Difficulty walking.  Altered mental status.  EXAM: CT HEAD WITHOUT CONTRAST  TECHNIQUE: Contiguous axial images were obtained from the base of the skull through the vertex without intravenous contrast.  COMPARISON:  None.  FINDINGS: A mass lesion in the right frontal lobe measures 3.3 x 4.4 cm on image 21. There is extensive surrounding vasogenic edema. There may be a second small mass in the left frontal lobe on image 22 measuring 1.2 cm in diameter. No other mass is seen. There is no midline shift or hydrocephalus. No hemorrhage is seen. The calvarium is intact.  IMPRESSION: Large mass lesion in the right frontal lobe with a likely second mass in the left frontal lobe. Brain MRI with and without contrast is recommended for further evaluation.  Critical Value/emergent results were called by telephone at the time of interpretation on 12/29/2013 at 8:46 pm to DAN FLOYD, P.A., who verbally acknowledged these results.   Electronically Signed   By: Inge Rise M.D.   On: 12/29/2013 20:48   Dg Chest Portable 1 View  12/29/2013   CLINICAL DATA:  Weakness.  EXAM: PORTABLE CHEST - 1 VIEW  COMPARISON:  10/08/2013  FINDINGS: The cardiac silhouette, mediastinal and hilar contours are within normal limits and stable. Slight increasingright upper lobe/ right paratracheal density. Chest CT recommended for further evaluation given the patient's history of smoking. Stable calcified granuloma in the left lung. No pleural effusions or pneumothorax. The bony thorax is intact.  IMPRESSION: Increasing right apical density. Recommend chest CT for further evaluation.   Electronically Signed   By: Kalman Jewels M.D.   On: 12/29/2013 20:25    CBC  Recent Labs Lab 12/29/13 2006  12/29/13 2019 12/30/13 0445 01/01/14 0547 01/01/14 2003 01/03/14 0530  WBC 12.9*  --  12.6* 21.8*  --  20.2*  HGB 11.7* 12.9* 11.4* 11.9* 10.2* 11.3*  HCT 36.6* 38.0* 35.6* 37.0* 30.0* 34.9*  PLT 290  --  269 278  --  219  MCV 94.1  --  94.2 95.9  --  96.1  MCH 30.1  --  30.2 30.8  --  31.1  MCHC 32.0  --  32.0 32.2  --  32.4  RDW 13.3  --  13.4 13.5  --  13.5  LYMPHSABS 2.7  --   --   --   --   --   MONOABS 1.3*  --   --   --   --   --   EOSABS 0.3  --   --   --   --   --   BASOSABS 0.1  --   --   --   --   --     Chemistries   Recent Labs Lab 12/29/13 2006 12/29/13 2019 12/30/13 0445 01/01/14 0547 01/01/14 2003 01/03/14 0530  NA 138 137 138 143 138 141  K 4.6 4.4 4.7 4.6 4.4 4.8  CL 102 105 104 106  --  107  CO2 25  --  21 26  --  25  GLUCOSE 93 97 128* 138*  --  131*  BUN 28* 27* 25* 27*  --  22  CREATININE 2.18* 2.30* 1.86* 1.70*  --  1.45*  CALCIUM 9.1  --  8.8 9.1  --  8.1*  AST 19  --   --   --   --   --   ALT  18  --   --   --   --   --   ALKPHOS 122*  --   --   --   --   --   BILITOT 0.3  --   --   --   --   --    ------------------------------------------------------------------------------------------------------------------ estimated creatinine clearance is 64.8 ml/min (by C-G formula based on Cr of 1.45). ------------------------------------------------------------------------------------------------------------------ No results found for this basename: HGBA1C,  in the last 72 hours ------------------------------------------------------------------------------------------------------------------ No results found for this basename: CHOL, HDL, LDLCALC, TRIG, CHOLHDL, LDLDIRECT,  in the last 72 hours ------------------------------------------------------------------------------------------------------------------ No results found for this basename: TSH, T4TOTAL, FREET3, T3FREE, THYROIDAB,  in the last 72  hours ------------------------------------------------------------------------------------------------------------------ No results found for this basename: VITAMINB12, FOLATE, FERRITIN, TIBC, IRON, RETICCTPCT,  in the last 72 hours  Coagulation profile  Recent Labs Lab 12/29/13 2006  INR 1.08    No results found for this basename: DDIMER,  in the last 72 hours  Cardiac Enzymes No results found for this basename: CK, CKMB, TROPONINI, MYOGLOBIN,  in the last 168 hours ------------------------------------------------------------------------------------------------------------------ No components found with this basename: POCBNP,     Arryn Terrones D.O. on 01/05/2014 at 2:31 PM  Between 7am to 7pm - Pager - [phone]  After 7pm go to [email or web address] - password [email or web address]  And look for the night coverage person covering for me after hours  [facility] Office  [phone]

## 2014-01-05 NOTE — Progress Notes (Signed)
Patient ID: Edward [name], male   DOB: 1946/02/29, 68 y.o.   MRN: [id number] Patient is doing a little better still has some difficulty voiding utilizing a condom catheter will continue with physical and occupational therapy possible ready for discharge tomorrow

## 2014-01-06 ENCOUNTER — Inpatient Hospital Stay (HOSPITAL_COMMUNITY): Payer: BC Managed Care – PPO

## 2014-01-06 ENCOUNTER — Ambulatory Visit: Admit: 2014-01-06 | Payer: BC Managed Care – PPO | Admitting: Radiation Oncology

## 2014-01-06 LAB — CBC
HCT: 34.6 % — ABNORMAL LOW (ref 39.0–52.0)
Hemoglobin: 11.3 g/dL — ABNORMAL LOW (ref 13.0–17.0)
MCH: 30.5 pg (ref 26.0–34.0)
MCHC: 32.7 g/dL (ref 30.0–36.0)
MCV: 93.5 fL (ref 78.0–100.0)
Platelets: 250 10*3/uL (ref 150–400)
RBC: 3.7 MIL/uL — AB (ref 4.22–5.81)
RDW: 13.4 % (ref 11.5–15.5)
WBC: 16.3 10*3/uL — ABNORMAL HIGH (ref 4.0–10.5)

## 2014-01-06 LAB — BASIC METABOLIC PANEL
ANION GAP: 9 (ref 5–15)
BUN: 24 mg/dL — ABNORMAL HIGH (ref 6–23)
CALCIUM: 8.2 mg/dL — AB (ref 8.4–10.5)
CO2: 26 meq/L (ref 19–32)
Chloride: 106 mEq/L (ref 96–112)
Creatinine, Ser: 1.43 mg/dL — ABNORMAL HIGH (ref 0.50–1.35)
GFR calc Af Amer: 57 mL/min — ABNORMAL LOW (ref >90)
GFR, EST NON AFRICAN AMERICAN: 50 mL/min — AB (ref >90)
Glucose, Bld: 105 mg/dL — ABNORMAL HIGH (ref 70–99)
Potassium: 4.2 mEq/L (ref 3.7–5.3)
SODIUM: 141 meq/L (ref 137–147)

## 2014-01-06 MED ORDER — LEVETIRACETAM 500 MG PO TABS
500.0000 mg | ORAL_TABLET | Freq: Two times a day (BID) | ORAL | Status: DC
  Administered 2014-01-06: 500 mg via ORAL
  Filled 2014-01-06: qty 1

## 2014-01-06 MED ORDER — CITALOPRAM HYDROBROMIDE 40 MG PO TABS: 40.0000 mg | ORAL_TABLET | Freq: Every day | ORAL | Status: AC

## 2014-01-06 MED ORDER — LEVETIRACETAM 500 MG PO TABS: 500.0000 mg | ORAL_TABLET | Freq: Two times a day (BID) | ORAL | Status: DC

## 2014-01-06 MED ORDER — CITALOPRAM HYDROBROMIDE 20 MG PO TABS: 20.0000 mg | ORAL_TABLET | Freq: Every day | ORAL | Status: DC

## 2014-01-06 MED ORDER — HYDROCODONE-ACETAMINOPHEN 5-325 MG PO TABS: 1.0000 | ORAL_TABLET | Freq: Four times a day (QID) | ORAL | Status: DC | PRN

## 2014-01-06 MED ORDER — DEXAMETHASONE 4 MG PO TABS: 4.0000 mg | ORAL_TABLET | Freq: Three times a day (TID) | ORAL | Status: DC

## 2014-01-06 NOTE — Discharge Summary (Signed)
Physician Discharge Summary  Patient ID: THANE AGE MRN: [id number] DOB/AGE: 1946/04/26 68 y.o.  Admit date: 12/29/2013 Discharge date: 01/06/2014  Admission Diagnoses: Brain tumor  Discharge Diagnoses: Glioblastoma multiform Principal Problem:   Brain mass Active Problems:   OSA on CPAP   Benign essential HTN   Hyperlipidemia   Vasogenic cerebral edema   ARF (acute renal failure)   Hx of renal cell cancer   Lung mass   Tobacco use disorder   Brain tumor   Discharged Condition: good  Hospital Course: The patient was admitted to [facility] on 12/29/2013 with a diagnosis of a brain tumor. The patient had a history of a renal cell carcinoma. He was worked up further with CT of the chest abdomen and pelvis which turned out negative.  I performed a right frontal craniotomy with Stealth neural  navigation and microdissection on the patient on 01/01/2014. The surgery went well. The patient's postoperative course was initially remarkable for some persistent nausea and vomiting. Because of this the patient refused his postoperative MRI scan on postoperative day #1. The scan was done a few days later which demonstrated a good resection of the tumor. There was some residual tumor in the region of the anterior cerebral arteries/corpus callosum.  The patient's tumor came back consistent with glioblastoma multiform. I discussed this diagnosis with the patient and his wife. The patient was also seen by Dr. Marin Olp. We discussed the various treatment options including radiation and Temodar. We also discussed possible referral to an academic institution. The patient would like to be [facility] at [facility]. We will make that outpatient referral.  On 01/06/2014 the patient looked and felt well. He requested discharge to home. The patient, his wife, were given oral and written discharge instructions. I'll plan to see the patient back in the office in approximately 2 weeks.  Consults:  Hospitalist, oncology, neurosurgery Significant Diagnostic Studies: Brain MRI, CT of the chest abdomen and pelvis Treatments: Right frontal craniotomy with microdissection using neuronavigation Discharge Exam: Blood pressure 118/55, pulse 67, temperature 98.5 F (36.9 C), temperature source Oral, resp. rate 19, height 6\' 1"  (1.854 m), weight 108.7 kg (239 lb 10.2 oz), SpO2 99.00%. The patient is alert and pleasant. His speech is normal. His strength is normal. His oriented x3. His incision is healing well.  Disposition: Home  Discharge Instructions   Call MD for:  difficulty breathing, headache or visual disturbances    Complete by:  As directed      Call MD for:  extreme fatigue    Complete by:  As directed      Call MD for:  hives    Complete by:  As directed      Call MD for:  persistant dizziness or light-headedness    Complete by:  As directed      Call MD for:  persistant nausea and vomiting    Complete by:  As directed      Call MD for:  redness, tenderness, or signs of infection (pain, swelling, redness, odor or green/yellow discharge around incision site)    Complete by:  As directed      Call MD for:  severe uncontrolled pain    Complete by:  As directed      Call MD for:  temperature >100.4    Complete by:  As directed      Diet - low sodium heart healthy    Complete by:  As directed      Discharge instructions  Complete by:  As directed   Call [phone] for a followup appointment. Take a stool softener while you are using pain medications.     Driving Restrictions    Complete by:  As directed   Do not drive for 2 weeks.     Increase activity slowly    Complete by:  As directed      Lifting restrictions    Complete by:  As directed   Do not lift more than 5 pounds. No excessive bending or twisting.     May shower / Bathe    Complete by:  As directed   He may shower after the pain she is removed 3 days after surgery. Leave the incision alone.     No dressing  needed    Complete by:  As directed      Nursing communication    Complete by:  As directed   Please remove the patient's scalp staples.            Medication List    STOP taking these medications       COLCRYS 0.6 MG tablet  Generic drug:  colchicine     furosemide 80 MG tablet  Commonly known as:  LASIX     potassium chloride SA 20 MEQ tablet  Commonly known as:  K-DUR,KLOR-CON     ramipril 5 MG capsule  Commonly known as:  ALTACE      TAKE these medications       allopurinol 100 MG tablet  Commonly known as:  ZYLOPRIM  Take 100 mg by mouth daily.     buPROPion 300 MG 24 hr tablet  Commonly known as:  WELLBUTRIN XL  Take 300 mg by mouth daily.     buPROPion 150 MG 24 hr tablet  Commonly known as:  WELLBUTRIN XL  Take 150 mg by mouth daily.     citalopram 20 MG tablet  Commonly known as:  CELEXA  Take 1 tablet (20 mg total) by mouth daily.     clonazePAM 0.5 MG tablet  Commonly known as:  KLONOPIN  Take 0.5 mg by mouth at bedtime.     dexamethasone 4 MG tablet  Commonly known as:  DECADRON  Take 1 tablet (4 mg total) by mouth every 8 (eight) hours.     HYDROcodone-acetaminophen 5-325 MG per tablet  Commonly known as:  NORCO/VICODIN  Take 1 tablet by mouth every 6 (six) hours as needed. For pain     levETIRAcetam 500 MG tablet  Commonly known as:  KEPPRA  Take 1 tablet (500 mg total) by mouth 2 (two) times daily.     LIPITOR 40 MG tablet  Generic drug:  atorvastatin  Take 40 mg by mouth daily.     metoprolol succinate 100 MG 24 hr tablet  Commonly known as:  TOPROL-XL  Take 100 mg by mouth daily.     MULTIVITAMIN PO  Take 1 tablet by mouth daily.     omeprazole 40 MG capsule  Commonly known as:  PRILOSEC  Take 40 mg by mouth daily.     zolpidem 10 MG tablet  Commonly known as:  AMBIEN  Take 10 mg by mouth at bedtime.         SignedOphelia Charter 01/06/2014, 3:29 PM

## 2014-01-06 NOTE — Progress Notes (Addendum)
[facility]                                                                              Patient Demographics  Edward [name], is a 68 y.o. male, DOB - [birth year], GLO:[id number]  Admit date - 12/29/2013   Admitting Physician Theressa Millard, MD  Outpatient Primary MD for the patient is Edward Pac, MD  LOS - 8   Chief Complaint  Patient presents with  . Weakness      HPI on 12/29/2013 Edward [name] is a 68 y.o. male with a history of Renal Cell Carcinoma S/P left Nephrectomy who presented to the ED with complaints of increased falls and difficulty walking over the past 2 weeks, and hesitance of speech. He reported having headaches but denied visual changes, but did have diplopia 1 year ago. He was evaluated in the ED and a CT Scan was performed which revealed 2 Brain Masses, one measuring 3.3 cm x 4.4 cm with associated vasogenic edema, and the second mass at 1.2 cm diameter. A RUL and Paratracheal mass was also seen on his Chest X-ray. He was referred for medical admission. He underwent debulking surgery of the tumour and a repeat MRI done. Plan for radiation oncology consultation today. Family also requesting for [facility] referral from Dr Arnoldo Morale.   Assessment & Plan   Brain mass with vasogenic edema -Seen on CT scan: Large mass lesion in the right frontal lobe with a likely second mass in the left frontal lobe.  -Patient noted to have some vasogenic edema, continue Decadron -MRI brain:4.0 x 3.1 x 4.1 cm enhancing mass within the right frontal lobe with associated mass effect  -Neurosurgery consulted and appreciated, s/p craniotomy , patho showed stage 4 GBM,and repeat MRI done last night.  -CT Chest/abd/pelvis: No evidence of lung mass, consistent with granulomatous disease, stable changes following left nephrectomy -Oncology consulted and appreciated. Plan for radiation oncology consult today.   Lung mass -Noted on CXR -However, not on CT chest, changes  consistent with prior granulomatous disease  Acute on chronic kidney failure, stage III -Patient s/p renal cell carcinoma s/p Left nephrectomy -Continue IVF  -Creatinine trending downward -Lasix and ACEi held, improving.     History of Renal Cell carcinoma -2009 -S/p left nephrectomy -Patient did not see an oncologist at that time.  He did not receive chemotherapy or radiation.  Essential hypertension -Continue metoprolol  Hyperlipidemia -Continue statin  Obstructive sleep apnea -Continue CPAP QHS  Tobacco Abuse -Nicotine patch -Smoking cessation counseling  Depression/anxiety -Continue Wellbutrin, Celexa, Klonopin  Nausea/vomiting -Zofran PRN  Leukocytosis; Probably from the decadron. Continue to monitor.   Shortness of breath on exertion: Lasix held for renal insufficiency. Will get CXR .   Code Status: Full  Family Communication: None at bedside  Disposition Plan: plan for discharge in 1 day.  Time Spent in minutes   25 minutes  Procedures  None  Consults   Neurosurgery Oncology  DVT Prophylaxis  Lovenox  Lab Results  Component Value Date   PLT 250 01/06/2014    Medications  Scheduled Meds: . allopurinol  100 mg Oral Daily  . antiseptic oral rinse  7 mL Mouth Rinse BID  .  atorvastatin  40 mg Oral Daily  . buPROPion  150 mg Oral Daily  . buPROPion  300 mg Oral Daily  . citalopram  20 mg Oral Daily  . clonazePAM  0.5 mg Oral QHS  . dexamethasone  4 mg Oral 3 times per day  . docusate sodium  100 mg Oral BID  . levETIRAcetam  500 mg Oral BID  . metoprolol succinate  100 mg Oral Daily  . nicotine  7 mg Transdermal Daily  . omeprazole  40 mg Oral BID AC  . zolpidem  5 mg Oral QHS   Continuous Infusions: . sodium chloride Stopped (01/01/14 2145)  . 0.9 % NaCl with KCl 20 mEq / L 75 mL/hr at 01/04/14 0315   PRN Meds:.acetaminophen, acetaminophen, alum & mag hydroxide-simeth, HYDROcodone-acetaminophen, HYDROmorphone (DILAUDID) injection,  labetalol, menthol-cetylpyridinium, morphine injection, ondansetron (ZOFRAN) IV, ondansetron, oxyCODONE, promethazine, sodium chloride  Antibiotics    Anti-infectives   Start     Dose/Rate Route Frequency Ordered Stop   01/02/14 0724  ceFAZolin (ANCEF) IVPB 2 g/50 mL premix     2 g 100 mL/hr over 30 Minutes Intravenous Every 8 hours 01/02/14 0724 01/02/14 1654   01/01/14 1851  bacitracin 50,000 Units in sodium chloride irrigation 0.9 % 500 mL irrigation  Status:  Discontinued       As needed 01/01/14 1855 01/01/14 2201      Subjective:   Earley Favor seen and examined today.  Patient complains of a headache. Heartburn. Discussed with the family the results of the pathology.   Objective:   Filed Vitals:   01/05/14 1807 01/05/14 2234 01/06/14 0104 01/06/14 0551  BP: 126/62 107/52 123/62 129/67  Pulse: 70 70 72 52  Temp: 97.5 F (36.4 C) 98 F (36.7 C) 98.8 F (37.1 C) 98.4 F (36.9 C)  TempSrc: Oral Oral Oral Oral  Resp: 20 20 20 20   Height:      Weight:      SpO2: 98% 97% 98% 99%    Wt Readings from Last 3 Encounters:  01/01/14 108.7 kg (239 lb 10.2 oz)  01/01/14 108.7 kg (239 lb 10.2 oz)  10/08/13 105.235 kg (232 lb)    No intake or output data in the 24 hours ending 01/06/14 0937  Exam  General: Well developed, well nourished, NAD, appears stated age  Cardiovascular: S1 S2 auscultated, no rubs, murmurs or gallops. Regular rate and rhythm.  Respiratory: Clear to auscultation bilaterally with equal chest rise  Abdomen: Soft, nontender, nondistended, + bowel sounds; vomiting  Extremities: warm dry without cyanosis clubbing or edema  Neuro: AAOx3, .    Data Review   Micro Results Recent Results (from the past 240 hour(s))  SURGICAL PCR SCREEN     Status: None   Collection Time    01/01/14  5:09 AM      Result Value Ref Range Status   MRSA, PCR NEGATIVE  NEGATIVE Final   Staphylococcus aureus NEGATIVE  NEGATIVE Final   Comment:            The  Xpert SA Assay (FDA     approved for NASAL specimens     in patients over 106 years of age),     is one component of     a comprehensive surveillance     program.  Test performance has     been validated by [employer] for patients greater     than or equal to 34 year old.  It is not intended     to diagnose infection nor to     guide or monitor treatment.    Radiology Reports Ct Head Wo Contrast  12/29/2013   CLINICAL DATA:  Difficulty walking.  Altered mental status.  EXAM: CT HEAD WITHOUT CONTRAST  TECHNIQUE: Contiguous axial images were obtained from the base of the skull through the vertex without intravenous contrast.  COMPARISON:  None.  FINDINGS: A mass lesion in the right frontal lobe measures 3.3 x 4.4 cm on image 21. There is extensive surrounding vasogenic edema. There may be a second small mass in the left frontal lobe on image 22 measuring 1.2 cm in diameter. No other mass is seen. There is no midline shift or hydrocephalus. No hemorrhage is seen. The calvarium is intact.  IMPRESSION: Large mass lesion in the right frontal lobe with a likely second mass in the left frontal lobe. Brain MRI with and without contrast is recommended for further evaluation.  Critical Value/emergent results were called by telephone at the time of interpretation on 12/29/2013 at 8:46 pm to DAN FLOYD, P.A., who verbally acknowledged these results.   Electronically Signed   By: Inge Rise M.D.   On: 12/29/2013 20:48   Dg Chest Portable 1 View  12/29/2013   CLINICAL DATA:  Weakness.  EXAM: PORTABLE CHEST - 1 VIEW  COMPARISON:  10/08/2013  FINDINGS: The cardiac silhouette, mediastinal and hilar contours are within normal limits and stable. Slight increasingright upper lobe/ right paratracheal density. Chest CT recommended for further evaluation given the patient's history of smoking. Stable calcified granuloma in the left lung. No pleural effusions or pneumothorax. The bony thorax is intact.   IMPRESSION: Increasing right apical density. Recommend chest CT for further evaluation.   Electronically Signed   By: Kalman Jewels M.D.   On: 12/29/2013 20:25    CBC  Recent Labs Lab 01/01/14 0547 01/01/14 2003 01/03/14 0530 01/06/14 0430  WBC 21.8*  --  20.2* 16.3*  HGB 11.9* 10.2* 11.3* 11.3*  HCT 37.0* 30.0* 34.9* 34.6*  PLT 278  --  219 250  MCV 95.9  --  96.1 93.5  MCH 30.8  --  31.1 30.5  MCHC 32.2  --  32.4 32.7  RDW 13.5  --  13.5 13.4    Chemistries   Recent Labs Lab 01/01/14 0547 01/01/14 2003 01/03/14 0530 01/06/14 0430  NA 143 138 141 141  K 4.6 4.4 4.8 4.2  CL 106  --  107 106  CO2 26  --  25 26  GLUCOSE 138*  --  131* 105*  BUN 27*  --  22 24*  CREATININE 1.70*  --  1.45* 1.43*  CALCIUM 9.1  --  8.1* 8.2*   ------------------------------------------------------------------------------------------------------------------ estimated creatinine clearance is 65.7 ml/min (by C-G formula based on Cr of 1.43). ------------------------------------------------------------------------------------------------------------------ No results found for this basename: HGBA1C,  in the last 72 hours ------------------------------------------------------------------------------------------------------------------ No results found for this basename: CHOL, HDL, LDLCALC, TRIG, CHOLHDL, LDLDIRECT,  in the last 72 hours ------------------------------------------------------------------------------------------------------------------ No results found for this basename: TSH, T4TOTAL, FREET3, T3FREE, THYROIDAB,  in the last 72 hours ------------------------------------------------------------------------------------------------------------------ No results found for this basename: VITAMINB12, FOLATE, FERRITIN, TIBC, IRON, RETICCTPCT,  in the last 72 hours  Coagulation profile No results found for this basename: INR, PROTIME,  in the last 168 hours  No results found for this  basename: DDIMER,  in the last 72 hours  Cardiac Enzymes No results found for this basename: CK, CKMB, TROPONINI,  MYOGLOBIN,  in the last 168 hours ------------------------------------------------------------------------------------------------------------------ No components found with this basename: POCBNP,     Keishawn Darsey D.O. on 01/06/2014 at 9:37 AM  Between 7am to 7pm - Pager - [phone]  After 7pm go to [email or web address] - password [email or web address]  And look for the night coverage person covering for me after hours  [facility] Office  [phone]

## 2014-01-06 NOTE — Progress Notes (Signed)
Edward [name] continues to look better. He is alert. He is oriented. He is really not complaining much of the way of pain.  He does have a glioblastoma. By his postop MRI, there does appear to be some residual disease at the surgical margin.  Again, I will have to get pathology to run somewhat specialized genetic and enzyme analysis on the tumor.  He is considering a second opinion at [facility]. I certainly have no problems with this. I told him that standard postoperative therapy is chemotherapy with radiation therapy for about 6 weeks and then full dose chemotherapy for 5 days a month for one year. I would think that [facility] would agree with this. I would think that he have a clinical protocol might be with respect to recurrent.  I talked to him about glioblastoma. I explained to him that this is a very difficult tumor to cure.. With treatment, is still tends to come back. I told him that average survival probably is about 20-24 months. He does understand this.  I will still get radiation oncology to see him.  His labs look good.   On his physical exam, his vital signs are stable. Blood pressure 129/67. Temperature 98.4. Pulse is 52. His head and exam shows a surgical dressing over the right frontoparietal region. Lungs are clear. Cardiac exam regular in rhythm. Neurological exam shows no focal deficits. There may be some slight decreased strength over on the left side.  It sounds like he may be going home today. If so, we will follow him up as an outpatient.  Frederich Cha 1:5-7

## 2014-01-06 NOTE — Progress Notes (Signed)
Physical Therapy Treatment Patient Details Name: Edward [name] MRN: [id number] DOB: 24-Dec-1945 Today's Date: 01/06/2014    History of Present Illness Edward [name] is a 68 y.o. male with a history of Renal Cell Carcinoma S/P left Nephrectomy who presents to the ED with complaints of increased falls and difficulty walking over the past 2 weeks, and hesitance of speech. He reports having headaches but denies visual changes, but He does report that he did have diplopia 1 year ago. MRI confirmed brain mass x2. Pt underwent craniotomy for tumor excision on 01/01/14.    PT Comments    Pt progressing with ambulation and balance. Experienced 3/4 DOE with exertion today, VSS, and wife mentions concern regarding swelling in abdomen and hands. Mentioned to RN. Pt ambulated 400' with supervision. Continues to have imbalance with high level activities and decreased activity tolerance. PT will continue to follow.   Follow Up Recommendations  Outpatient PT;Supervision - Intermittent     Equipment Recommendations  None recommended by PT    Recommendations for Other Services       Precautions / Restrictions Precautions Precautions: Fall Precaution Comments: crani Restrictions Weight Bearing Restrictions: No    Mobility  Bed Mobility Overal bed mobility: Independent                Transfers Overall transfer level: Modified independent Equipment used: None                Ambulation/Gait Ambulation/Gait assistance: Supervision;Min guard Ambulation Distance (Feet): 400 Feet Assistive device: None Gait Pattern/deviations: Step-through pattern;Decreased stride length Gait velocity: decreased Gait velocity interpretation: Below normal speed for age/gender General Gait Details: supervision with normal gait, min-guard when being challenged: bkwd ambulation, turning around, quick stops, high marching. Worked on increasing step length and pace. Pt able to do so with vc's but  falls back to short step length when distracted. Somewhat internally distracted during session today   Stairs            Wheelchair Mobility    Modified Rankin (Stroke Patients Only)       Balance Overall balance assessment: Needs assistance Sitting-balance support: No upper extremity supported Sitting balance-Leahy Scale: Normal     Standing balance support: No upper extremity supported;During functional activity Standing balance-Leahy Scale: Good                 High Level Balance Comments: worked on high level activities including throwing and catching and picking objects up off floor     Cognition Arousal/Alertness: Awake/alert Behavior During Therapy: Flat affect Overall Cognitive Status: Within Functional Limits for tasks assessed                      Exercises      General Comments General comments (skin integrity, edema, etc.): noted swelling in abdomen and hands. Pt's wife concerned about this as well as with DOE 3/4 with getting up from low position. Mentioned this to RN.      Pertinent Vitals/Pain Pain Assessment: No/denies pain    Home Living                      Prior Function            PT Goals (current goals can now be found in the care plan section) Acute Rehab PT Goals Patient Stated Goal: return home PT Goal Formulation: With patient Time For Goal Achievement: 01/19/14 Potential to Achieve Goals: Good Progress towards PT  goals: Progressing toward goals    Frequency  Min 4X/week    PT Plan Current plan remains appropriate    Co-evaluation             End of Session Equipment Utilized During Treatment: Gait belt Activity Tolerance: Patient tolerated treatment well Patient left: in chair;with call bell/phone within reach;with family/visitor present     Time: 1136-1208 PT Time Calculation (min): 32 min  Charges:  $Gait Training: 23-37 mins                    G Codes:     Leighton Roach, PT   Acute Rehab Services  [place], [place] 01/06/2014, 2:01 PM

## 2014-01-07 LAB — POCT I-STAT 7, (LYTES, BLD GAS, ICA,H+H)
Bicarbonate: 25.6 mEq/L — ABNORMAL HIGH (ref 20.0–24.0)
Calcium, Ion: 1.19 mmol/L (ref 1.13–1.30)
HCT: 31 % — ABNORMAL LOW (ref 39.0–52.0)
Hemoglobin: 10.5 g/dL — ABNORMAL LOW (ref 13.0–17.0)
O2 SAT: 100 %
PCO2 ART: 43.8 mmHg (ref 35.0–45.0)
Potassium: 4.1 mEq/L (ref 3.7–5.3)
Sodium: 140 mEq/L (ref 137–147)
TCO2: 27 mmol/L (ref 0–100)
pH, Arterial: 7.374 (ref 7.350–7.450)
pO2, Arterial: 308 mmHg — ABNORMAL HIGH (ref 80.0–100.0)

## 2014-01-07 NOTE — Progress Notes (Addendum)
Location/Histology of Brain Tumor: Right Frontal Lobe: Glioblastoma   Patient presented with symptoms of: The patient is a 68 year old white male who has a history of renal cell carcinoma as well as scalp basal cell carcinoma. The patient presented with some difficulty walking and mental status changes. He was worked up with a head CT which demonstrated a right frontal brain lesion and a possible second smaller left frontal lesion. A neurosurgical consultation was requested. In the meantime the patient has undergone a CT of the chest, abdomen, and pelvis which did not demonstrate any other lesions.   Past or anticipated interventions, if any, per neurosurgery:Resection of Tumor right Frontal Lobe   12/20/13  Diagnosis 1. Brain, for tumor resection, Right frontal - NECROSIS, INFLAMMATION, AND SCATTERED ATYPICAL CELLS. 2. Brain, for tumor resection, Right frontal - GLIOBLASTOMA (WHO GRADE IV).   Past or anticipated interventions, if any, per medical oncology: Unknown  Dose of Decadron, if applicable: 4 mg po Q 8 hours  Recent neurologic symptoms, if any:   Seizures: No  Headaches: NO  Nausea: vomiting this am, but took medications without eating  Dizziness/ataxia: Difficulty walking, 2 falls in the past month- hurt his back and side with bruising  Difficulty with hand coordination:" slight weakness in left leg.  Difficulty with balance  Focal numbness/weakness:No  Visual deficits/changes: No  Confusion/Memory deficits:Yes, Mental Status Changes  Painful bone metastases at present, if any:   SAFETY ISSUES:  Prior radiation? NO  Pacemaker/ICD? NO  Possible current pregnancy? N/A  Is the patient on methotrexate? NO  Additional Complaints / other details: some right hearing loss and has a partial right facial nerve palsy with a history of Bell's palsy  Smoker: 1 pack/day, No alcohol, No illicit drug use

## 2014-01-08 ENCOUNTER — Encounter: Payer: Self-pay | Admitting: Radiation Oncology

## 2014-01-08 ENCOUNTER — Ambulatory Visit
Admit: 2014-01-08 | Discharge: 2014-01-08 | Disposition: A | Discharge status: Home / Self Care | Payer: BC Managed Care – PPO | Attending: Radiation Oncology | Admitting: Radiation Oncology

## 2014-01-08 VITALS — BP 103/71

## 2014-01-08 DIAGNOSIS — Z9989 Dependence on other enabling machines and devices: Secondary | ICD-10-CM | POA: Insufficient documentation

## 2014-01-08 DIAGNOSIS — I10 Essential (primary) hypertension: Secondary | ICD-10-CM | POA: Diagnosis not present

## 2014-01-08 DIAGNOSIS — F17200 Nicotine dependence, unspecified, uncomplicated: Secondary | ICD-10-CM | POA: Insufficient documentation

## 2014-01-08 DIAGNOSIS — Z510 Encounter for antineoplastic radiation therapy: Secondary | ICD-10-CM | POA: Diagnosis not present

## 2014-01-08 DIAGNOSIS — F329 Major depressive disorder, single episode, unspecified: Secondary | ICD-10-CM | POA: Diagnosis not present

## 2014-01-08 DIAGNOSIS — G4733 Obstructive sleep apnea (adult) (pediatric): Secondary | ICD-10-CM | POA: Insufficient documentation

## 2014-01-08 DIAGNOSIS — Z79899 Other long term (current) drug therapy: Secondary | ICD-10-CM | POA: Diagnosis not present

## 2014-01-08 DIAGNOSIS — C711 Malignant neoplasm of frontal lobe: Secondary | ICD-10-CM | POA: Insufficient documentation

## 2014-01-08 DIAGNOSIS — E7800 Pure hypercholesterolemia, unspecified: Secondary | ICD-10-CM | POA: Diagnosis not present

## 2014-01-08 DIAGNOSIS — F3289 Other specified depressive episodes: Secondary | ICD-10-CM | POA: Insufficient documentation

## 2014-01-08 NOTE — Progress Notes (Addendum)
Radiation Oncology         [phone]) [phone] ________________________________  Initial outpatient Consultation  Name: Edward [name] MRN: [id number]  Date: 01/08/2014  DOB: June 28, 1945  AS:[id number],IWLNL Marigene Ehlers, MD  Kristeen Miss, MD ; Derrill Memo MD; Earle Gell MD  REFERRING PHYSICIAN: Burney Gauze MD  DIAGNOSIS:  GLIOBLASTOMA of RIGHT FRONTAL LOBE  HISTORY OF PRESENT ILLNESS::Edward [name] is a 68 y.o. right-handed male who has a history of T1bNxMx renal cell carcinoma, treated with left nephrectomy in 2009 with no adjuvant therapy. He was in his usual state of health until a few months ago when he developed progressive difficulty with walking, falls at home, as well as mental status changes. He recently was admitted to the emergency room for these issues, as well as headaches. A CT scan of his brain demonstrated 2 possible masses in the brain (bilateral frontal lobes ) and a subsequent MRI of the brain with and without contrast on 12/30/2012 showed a large right frontal mass that was 4.0 x 3.1 x 4.1 cm in dimension. Surrounding flair abnormality consistent with vasogenic edema. There was not any tumor appreciated in the contralateral frontal lobe. There was associated mass effect. Primary brain tumor versus metastatic disease were in the differential.  Dr. Arnoldo Morale performed a right frontal craniotomy to resect the tumor on 01/01/2014. According to his operative note, he removed all tumor that he could see. Postoperative MRI on 01/03/2014 demonstrates improvement in the surrounding edema and mass effect; there is residual enhancement around the surgical cavity consistent with residual tumor as well as postoperative blood products.  Symptomatically the patient is doing much better after surgery. He says that he had been dragging his left leg when walking before surgery and is now able to ambulate without any major difficulties. His mental status has returned back to baseline and his family  confirms this. They are wondering if further resection is warranted.  They have met with medical oncology and they discussed Temodar with Dr. Marin Olp. He is currently taking Keppra; also on dexamethasone 4 mg 3 times a day.  PREVIOUS RADIATION THERAPY: No  PAST MEDICAL HISTORY:  has a past medical history of HTN (hypertension); Hypercholesteremia; Depression; Cancer; H/O diplopia (01/10/2013); and OSA on CPAP (04/08/2013).    PAST SURGICAL HISTORY: Past Surgical History  Procedure Laterality Date  . Kidney surgery  2009  . Penile prosthesis implant  2011  . Craniotomy N/A 01/01/2014    Procedure: CRANIOTOMY FOR TUMOR EXCISION;  Surgeon: Newman Pies, MD;  Location: [facility];  Service: Neurosurgery;  Laterality: N/A;  Craniotomy for tumor resection with stealth    FAMILY HISTORY: family history includes Cancer in his mother; Heart failure in his father.  SOCIAL HISTORY:  reports that he has been smoking Cigarettes.  He has been smoking about 1.00 pack per day. He does not have any smokeless tobacco history on file. He reports that he does not drink alcohol or use illicit drugs.  ALLERGIES: Nsaids  MEDICATIONS:  Current Outpatient Prescriptions  Medication Sig Dispense Refill  . allopurinol (ZYLOPRIM) 100 MG tablet Take 100 mg by mouth daily.       Marland Kitchen buPROPion (WELLBUTRIN XL) 150 MG 24 hr tablet Take 150 mg by mouth daily.       Marland Kitchen buPROPion (WELLBUTRIN XL) 300 MG 24 hr tablet Take 300 mg by mouth daily.      . citalopram (CELEXA) 40 MG tablet Take 1 tablet (40 mg total) by mouth daily.  30 tablet  2  . clonazePAM (KLONOPIN) 0.5 MG tablet Take 0.5 mg by mouth at bedtime.       Marland Kitchen dexamethasone (DECADRON) 4 MG tablet Take 1 tablet (4 mg total) by mouth every 8 (eight) hours.  30 tablet  0  . furosemide (LASIX) 80 MG tablet       . HYDROcodone-acetaminophen (NORCO/VICODIN) 5-325 MG per tablet Take 1 tablet by mouth every 6 (six) hours as needed. For pain  10 tablet  0  . KLOR-CON  M20 20 MEQ tablet       . levETIRAcetam (KEPPRA) 500 MG tablet Take 1 tablet (500 mg total) by mouth 2 (two) times daily.  60 tablet  1  . LIPITOR 40 MG tablet Take 40 mg by mouth daily.       . metoprolol succinate (TOPROL-XL) 100 MG 24 hr tablet Take 100 mg by mouth daily.       . Multiple Vitamins-Minerals (MULTIVITAMIN PO) Take 1 tablet by mouth daily.      Marland Kitchen omeprazole (PRILOSEC) 40 MG capsule Take 40 mg by mouth daily.       . ramipril (ALTACE) 5 MG capsule       . zolpidem (AMBIEN) 10 MG tablet Take 10 mg by mouth at bedtime.        No current facility-administered medications for this encounter.    REVIEW OF SYSTEMS:  Notable for that above.   PHYSICAL EXAM:  blood pressure is 103/71.   General: Alert and oriented, in no acute distress HEENT: Staples have been removed from his right frontal surgical site. No sign of scalp infection.  Extraocular movements are intact. Oropharynx is clear. He has right Bell's palsy which is a chronic condition for him Neck: Neck is supple, no palpable cervical or supraclavicular lymphadenopathy. Heart: Regular in rate and rhythm with no murmurs, rubs, or gallops. Chest: Clear to auscultation bilaterally, with no rhonchi, wheezes, or rales. Abdomen: Soft, nontender, nondistended, with no rigidity or guarding. Extremities: Lower extremity ankle edema. Lymphatics: No concerning lymphadenopathy. Skin: No concerning lesions. Musculoskeletal: Left leg is slightly weaker than the right. No upper extremity deficits. Neurologic: Right Bell's palsy. Speech is fluent. Coordination is intact. Psychiatric: Judgment and insight are intact.   KPS = 80  100 - Normal; no complaints; no evidence of disease. 90   - Able to carry on normal activity; minor signs or symptoms of disease. 80   - Normal activity with effort; some signs or symptoms of disease. 8   - Cares for self; unable to carry on normal activity or to do active work. 60   - Requires occasional  assistance, but is able to care for most of his personal needs. 50   - Requires considerable assistance and frequent medical care. 25   - Disabled; requires special care and assistance. 24   - Severely disabled; hospital admission is indicated although death not imminent. 89   - Very sick; hospital admission necessary; active supportive treatment necessary. 10   - Moribund; fatal processes progressing rapidly. 0     - Dead  Karnofsky DA, Abelmann [place], Craver LS and Burchenal [facility] [phone]) The use of the nitrogen mustards in the palliative treatment of carcinoma: with particular reference to bronchogenic carcinoma Cancer 1 [id number]  LABORATORY DATA:  Lab Results  Component Value Date   WBC 16.3* 01/06/2014   HGB 11.3* 01/06/2014   HCT 34.6* 01/06/2014   MCV 93.5 01/06/2014   PLT 250 01/06/2014   CMP  Component Value Date/Time   NA 141 01/06/2014 0430   K 4.2 01/06/2014 0430   CL 106 01/06/2014 0430   CO2 26 01/06/2014 0430   GLUCOSE 105* 01/06/2014 0430   BUN 24* 01/06/2014 0430   CREATININE 1.43* 01/06/2014 0430   CALCIUM 8.2* 01/06/2014 0430   PROT 6.8 12/29/2013 2006   ALBUMIN 3.1* 12/29/2013 2006   AST 19 12/29/2013 2006   ALT 18 12/29/2013 2006   ALKPHOS 122* 12/29/2013 2006   BILITOT 0.3 12/29/2013 2006   GFRNONAA 50* 01/06/2014 0430   GFRAA 57* 01/06/2014 0430         RADIOGRAPHY: Ct Abdomen Pelvis Wo Contrast  12/30/2013   CLINICAL DATA:  Lung mass seen on chest x-ray. Staging. Based on previous history the patient has a history of left nephrectomy for renal cell carcinoma.  EXAM: CT CHEST, ABDOMEN AND PELVIS WITHOUT CONTRAST  TECHNIQUE: Multidetector CT imaging of the chest, abdomen and pelvis was performed following the standard protocol without IV contrast.  COMPARISON:  CT of the abdomen and pelvis on 09/19/2011  FINDINGS: CT CHEST FINDINGS  Heart: Heart size is normal. No significant coronary artery calcification identified.  Vascular structures: There is atherosclerotic calcification  of the thoracic aorta. No aneurysm.  Mediastinum/thyroid: The visualized portion of the thyroid gland has a normal appearance. Small calcified bilateral hilar lymph nodes are identified. No noncalcified mediastinal, hilar, or axillary adenopathy.  Lungs: No pulmonary nodules, pleural effusions, or infiltrates. Specifically, the right upper lobe/apex is unremarkable. There is a calcified granuloma in the left lower lobe. High-resolution images are unremarkable.  Chest wall/osseous structures: Mild, symmetric bilateral gynecomastia.  CT ABDOMEN AND PELVIS FINDINGS  Upper abdomen: There are numerous splenic granulomata. No focal abnormality identified within the liver, pancreas, or right kidney. The right adrenal gland has a normal appearance. The left kidney is absent. Posterior to left adrenal gland, there is an area of probable fat necrosis or other benign lesion measuring 3.7 cm and stable appearance. The gallbladder is present.  Bowel: There is a moderate hiatal hernia. Small bowel loops are normal in appearance. The appendix is well seen and has a normal appearance. There is moderate stool burden. Scattered colonic diverticular present. No acute diverticulitis.  Pelvis: Penile implant is present in the left hemipelvis. Urinary bladder is normal in appearance.  Retroperitoneum: No retroperitoneal or mesenteric adenopathy. No evidence for aortic aneurysm.  Abdominal wall: Small periumbilical hernia contains mesenteric fat.  Osseous structures: Degenerative changes are seen at L5-S1.  IMPRESSION: 1. No evidence for lung mass. 2. Changes consistent prior granulomatous disease. 3. Mild bilateral gynecomastia. 4. Stable changes following left nephrectomy. 5. Moderate hiatal hernia. 6. Normal appendix. 7. Moderate stool burden. 8. Colonic diverticulosis. 9. Small periumbilical hernia contains only mesenteric fat.   Electronically Signed   By: Shon Hale M.D.   On: 12/30/2013 17:46   Dg Chest 2 View  01/06/2014    CLINICAL DATA:  68 year old male shortness of Breath. Initial encounter.  EXAM: CHEST  2 VIEW  COMPARISON:  01/01/2014 and earlier.  FINDINGS: Stable left IJ central line. Improved lung volumes. Stable hiatal hernia. Other mediastinal contours are within normal limits. Visualized tracheal air column is within normal limits. No pneumothorax or pulmonary edema. Small bilateral pleural effusions. No other confluent pulmonary opacity. No acute osseous abnormality identified.  IMPRESSION: 1. Small bilateral pleural effusions. 2. Stable left IJ central line.   Electronically Signed   By: Lars Pinks M.D.   On: 01/06/2014 13:56  Ct Head Wo Contrast  12/29/2013   CLINICAL DATA:  Difficulty walking.  Altered mental status.  EXAM: CT HEAD WITHOUT CONTRAST  TECHNIQUE: Contiguous axial images were obtained from the base of the skull through the vertex without intravenous contrast.  COMPARISON:  None.  FINDINGS: A mass lesion in the right frontal lobe measures 3.3 x 4.4 cm on image 21. There is extensive surrounding vasogenic edema. There may be a second small mass in the left frontal lobe on image 22 measuring 1.2 cm in diameter. No other mass is seen. There is no midline shift or hydrocephalus. No hemorrhage is seen. The calvarium is intact.  IMPRESSION: Large mass lesion in the right frontal lobe with a likely second mass in the left frontal lobe. Brain MRI with and without contrast is recommended for further evaluation.  Critical Value/emergent results were called by telephone at the time of interpretation on 12/29/2013 at 8:46 pm to DAN FLOYD, P.A., who verbally acknowledged these results.   Electronically Signed   By: Inge Rise M.D.   On: 12/29/2013 20:48   Ct Chest Wo Contrast  12/30/2013   CLINICAL DATA:  Lung mass seen on chest x-ray. Staging. Based on previous history the patient has a history of left nephrectomy for renal cell carcinoma.  EXAM: CT CHEST, ABDOMEN AND PELVIS WITHOUT CONTRAST  TECHNIQUE:  Multidetector CT imaging of the chest, abdomen and pelvis was performed following the standard protocol without IV contrast.  COMPARISON:  CT of the abdomen and pelvis on 09/19/2011  FINDINGS: CT CHEST FINDINGS  Heart: Heart size is normal. No significant coronary artery calcification identified.  Vascular structures: There is atherosclerotic calcification of the thoracic aorta. No aneurysm.  Mediastinum/thyroid: The visualized portion of the thyroid gland has a normal appearance. Small calcified bilateral hilar lymph nodes are identified. No noncalcified mediastinal, hilar, or axillary adenopathy.  Lungs: No pulmonary nodules, pleural effusions, or infiltrates. Specifically, the right upper lobe/apex is unremarkable. There is a calcified granuloma in the left lower lobe. High-resolution images are unremarkable.  Chest wall/osseous structures: Mild, symmetric bilateral gynecomastia.  CT ABDOMEN AND PELVIS FINDINGS  Upper abdomen: There are numerous splenic granulomata. No focal abnormality identified within the liver, pancreas, or right kidney. The right adrenal gland has a normal appearance. The left kidney is absent. Posterior to left adrenal gland, there is an area of probable fat necrosis or other benign lesion measuring 3.7 cm and stable appearance. The gallbladder is present.  Bowel: There is a moderate hiatal hernia. Small bowel loops are normal in appearance. The appendix is well seen and has a normal appearance. There is moderate stool burden. Scattered colonic diverticular present. No acute diverticulitis.  Pelvis: Penile implant is present in the left hemipelvis. Urinary bladder is normal in appearance.  Retroperitoneum: No retroperitoneal or mesenteric adenopathy. No evidence for aortic aneurysm.  Abdominal wall: Small periumbilical hernia contains mesenteric fat.  Osseous structures: Degenerative changes are seen at L5-S1.  IMPRESSION: 1. No evidence for lung mass. 2. Changes consistent prior  granulomatous disease. 3. Mild bilateral gynecomastia. 4. Stable changes following left nephrectomy. 5. Moderate hiatal hernia. 6. Normal appendix. 7. Moderate stool burden. 8. Colonic diverticulosis. 9. Small periumbilical hernia contains only mesenteric fat.   Electronically Signed   By: Shon Hale M.D.   On: 12/30/2013 17:46   Mr Jeri Cos VV Contrast  01/03/2014   CLINICAL DATA:  Right frontal glioblastoma. Postop resection 01/01/2014.  EXAM: MRI HEAD WITHOUT AND WITH CONTRAST  TECHNIQUE: Multiplanar, multiecho  pulse sequences of the brain and surrounding structures were obtained without and with intravenous contrast.  CONTRAST:  28m MULTIHANCE GADOBENATE DIMEGLUMINE 529 MG/ML IV SOLN  COMPARISON:  MRI 12/30/2013  FINDINGS: Right frontal craniotomy for tumor resection. Large necrotic tumor in the right frontal lobe has been partially resected. Blood products are present within the surgical cavity and surrounding tissues. Improvement in edema and mass-effect on the right frontal horn compared with the preop MRI. Small area of restricted diffusion along the inferior margin of the tumor may represent a small area of infarction.  Early stages of methemoglobin are present in the tumor. This is related to 48 hr since the surgical resection. There is enhancement of residual tumor along the anterior, medial, and posterior margins of the surgical cavity. There is mass-effect on the right frontal horn which is improved. No shift of the midline structures. Tumor/edema is present in the corpus callosum. Corpus callosum does not show abnormal enhancement.  No other enhancing lesions are identified.  IMPRESSION: Successful debulking and resection of right frontal glioblastoma. Improvement in edema and mass-effect since the preop study. There is blood in the surgical cavity and surrounding tissues. There is residual enhancing tumor surrounding the surgical cavity.   Electronically Signed   By: CFranchot GalloM.D.   On:  01/03/2014 21:06   Mr BJeri CosWUYContrast  12/30/2013   CLINICAL DATA:  Brain tumor  EXAM: MRI HEAD WITHOUT AND WITH CONTRAST  TECHNIQUE: Multiplanar, multiecho pulse sequences of the brain and surrounding structures were obtained without and with intravenous contrast.  CONTRAST:  160mMULTIHANCE GADOBENATE DIMEGLUMINE 529 MG/ML IV SOLN  COMPARISON:  Prior CT from 12/29/2013  FINDINGS: An irregular intra-axial mass measuring 4.0 (AP) x 3.1 (transverse) x 4.1 (craniocaudad) cm is seen within the medial right frontal lobe. The mass demonstrates hypointense T1 signal with irregular peripheral enhancement. Probable necrosis seen centrally within this lesion. Surrounding hyperintense FLAIR signal consistent with vasogenic edema seen within the adjacent right frontal lobe and anterior right coarse post callosum. No enhancement or definite infiltration across the corpus callosum to the contralateral left frontal lobe identified. There is mass effect on the adjacent frontal horn of the right lateral with approximately 9 mm of localized right-to-left midline shift at the level of the lesion. No hydrocephalus.  No other abnormal enhancement or mass lesions identified within the brain. Probable mild scattered small vessel ischemic changes present within the periventricular and deep white matter both cerebral hemispheres.  Pituitary gland within normal limits. No acute abnormality seen about the orbits.  Craniocervical junction is within normal limits. Visualized bone marrow signal intensity is normal. Scalp soft tissues are unremarkable.  Paranasal sinuses and mastoid air cells are grossly clear.  IMPRESSION: 4.0 x 3.1 x 4.1 cm enhancing mass within the right frontal lobe with associated mass effect as above. Differential considerations include a primary high-grade neoplasm versus metastatic disease given the history of renal cell carcinoma.   Electronically Signed   By: BeJeannine Boga.D.   On: 12/30/2013 22:05    Dg Chest Port 1 View  01/01/2014   CLINICAL DATA:  Central line placement.  EXAM: PORTABLE CHEST - 1 VIEW  COMPARISON:  12/29/2013.  FINDINGS: The left IJ catheter tip is in the proximal SVC. No complicating features. The cardiac silhouette, mediastinal and hilar contours are stable. The lungs are clear.  IMPRESSION: Left IJ catheter tip is in the proximal SVC. No complicating features.   Electronically Signed   By: MaElta Guadeloupe  Gallerani M.D.   On: 01/01/2014 23:28   Dg Chest Portable 1 View  12/29/2013   CLINICAL DATA:  Weakness.  EXAM: PORTABLE CHEST - 1 VIEW  COMPARISON:  10/08/2013  FINDINGS: The cardiac silhouette, mediastinal and hilar contours are within normal limits and stable. Slight increasingright upper lobe/ right paratracheal density. Chest CT recommended for further evaluation given the patient's history of smoking. Stable calcified granuloma in the left lung. No pleural effusions or pneumothorax. The bony thorax is intact.  IMPRESSION: Increasing right apical density. Recommend chest CT for further evaluation.   Electronically Signed   By: Kalman Jewels M.D.   On: 12/29/2013 20:25      PATH 01-01-14: Diagnosis 1. Brain, for tumor resection, Right frontal - NECROSIS, INFLAMMATION, AND SCATTERED ATYPICAL CELLS. 2. Brain, for tumor resection, Right frontal - GLIOBLASTOMA (WHO GRADE IV). Microscopic Comment 2. ONCOLOGY TABLE - BRAIN AND SPINAL CORD 1. Procedure: Right frontal craniotomy. 2. Tumor site, including laterality: Right frontal lobe. 3. Maximum tumor size (cm): N/A fragments aggregate to 3.3 cm. Imaging with 4.1 cm mass. 4. Histologic type: Glioblastoma. 5. Grade: IV. 6. Margins (if applicable): Can not be assessed 7. Ancillary studies: Can be performed upon request.  IMPRESSION/PLAN: This is a lovely 68 yo old gentleman with a right frontal glioblastoma that was recently resected.  They have a good understanding of his prognosis, and understand he has a serious  condition. They would like to be aggressive to maximize his life expectancy while maintaining his QOL.  There is a question of some residual tumor on postoperative MRI and he is seeking a second opinion from Dr. Tommi Rumps at [facility] regarding this. I did not discourage him from doing so and told the patient that I am happy to coordinate care with [facility] if he wishes to receive any further therapies in the form of surgery or chemotherapy  at that institution. We discussed the excellent technology available at our department to deliver postoperative radiotherapy for glioblastoma tumors (Tomotherapy and/or Truebeam LINACs). We discussed the logistics, benefits, risks, and side effects of such treatment. He understands that the standard approach is to give 30 treatments of postoperative radiotherapy focusing on the surgical bed with margin around the surgical bed to cover microscopic disease that tends to infiltrate the brain for this type of tumor. I would cover some of the contralateral brain that is adjacent to the corpus callosum as microscopic tumor cells have a tendency to spread in this fashion for glioblastomas. I asked the patient and his family to give me a call, or to call our brain tumor navigator, it in the next week to update [place] on his status with [facility]. We will not schedule any followup at this time as they are awaiting an opinion from Dr. Tommi Rumps.  My goal is to start adjuvant therapy within a month of his surgery. They understand that radiotherapy takes about a week and a half to plan after CT simulation.  After a detailed discussion regarding radiotherapy, including potential side effects, which may include but not necessarily be limited to fatigue, skin irritation, hair loss, headaches, nausea, eye irritation, and potential cognitive decline, a consent form was signed and placed in his chart. We talked about techniques and technology utilized at our institution that aim  to minimize the risk of side effects.  I enjoyed meeting Mr. Deeley and his family today. I look forward to participating in his care.  __________________________________________   Eppie Gibson, MD

## 2014-01-14 ENCOUNTER — Encounter (HOSPITAL_COMMUNITY): Payer: Self-pay

## 2014-01-27 ENCOUNTER — Other Ambulatory Visit: Payer: Self-pay | Admitting: *Deleted

## 2014-01-27 DIAGNOSIS — C711 Malignant neoplasm of frontal lobe: Secondary | ICD-10-CM

## 2014-01-28 ENCOUNTER — Other Ambulatory Visit (HOSPITAL_BASED_OUTPATIENT_CLINIC_OR_DEPARTMENT_OTHER): Payer: BC Managed Care – PPO | Admitting: Lab

## 2014-01-28 ENCOUNTER — Other Ambulatory Visit: Payer: BC Managed Care – PPO | Admitting: Lab

## 2014-01-28 ENCOUNTER — Encounter: Payer: Self-pay | Admitting: Hematology & Oncology

## 2014-01-28 ENCOUNTER — Ambulatory Visit (HOSPITAL_BASED_OUTPATIENT_CLINIC_OR_DEPARTMENT_OTHER): Payer: BC Managed Care – PPO | Admitting: Hematology & Oncology

## 2014-01-28 ENCOUNTER — Ambulatory Visit: Payer: BC Managed Care – PPO | Admitting: Hematology & Oncology

## 2014-01-28 VITALS — BP 153/82 | HR 54 | Temp 98.1°F | Resp 18 | Ht 72.0 in | Wt 239.0 lb

## 2014-01-28 DIAGNOSIS — Z905 Acquired absence of kidney: Secondary | ICD-10-CM

## 2014-01-28 DIAGNOSIS — N289 Disorder of kidney and ureter, unspecified: Secondary | ICD-10-CM

## 2014-01-28 DIAGNOSIS — C711 Malignant neoplasm of frontal lobe: Secondary | ICD-10-CM

## 2014-01-28 LAB — COMPREHENSIVE METABOLIC PANEL
ALK PHOS: 78 U/L (ref 39–117)
ALT: 30 U/L (ref 0–53)
AST: 14 U/L (ref 0–37)
Albumin: 3.3 g/dL — ABNORMAL LOW (ref 3.5–5.2)
BILIRUBIN TOTAL: 0.4 mg/dL (ref 0.2–1.2)
BUN: 31 mg/dL — ABNORMAL HIGH (ref 6–23)
CO2: 28 mEq/L (ref 19–32)
Calcium: 8.7 mg/dL (ref 8.4–10.5)
Chloride: 106 mEq/L (ref 96–112)
Creatinine, Ser: 1.91 mg/dL — ABNORMAL HIGH (ref 0.50–1.35)
Glucose, Bld: 73 mg/dL (ref 70–99)
Potassium: 4.7 mEq/L (ref 3.5–5.3)
SODIUM: 140 meq/L (ref 135–145)
Total Protein: 5.5 g/dL — ABNORMAL LOW (ref 6.0–8.3)

## 2014-01-28 LAB — PREALBUMIN: Prealbumin: 32.3 mg/dL (ref 17.0–34.0)

## 2014-01-28 LAB — CBC WITH DIFFERENTIAL (CANCER CENTER ONLY)
BASO#: 0 10*3/uL (ref 0.0–0.2)
BASO%: 0.2 % (ref 0.0–2.0)
EOS%: 1.5 % (ref 0.0–7.0)
Eosinophils Absolute: 0.2 10*3/uL (ref 0.0–0.5)
HCT: 36.1 % — ABNORMAL LOW (ref 38.7–49.9)
HGB: 11.8 g/dL — ABNORMAL LOW (ref 13.0–17.1)
LYMPH#: 2 10*3/uL (ref 0.9–3.3)
LYMPH%: 18.3 % (ref 14.0–48.0)
MCH: 30.7 pg (ref 28.0–33.4)
MCHC: 32.7 g/dL (ref 32.0–35.9)
MCV: 94 fL (ref 82–98)
MONO#: 1 10*3/uL — AB (ref 0.1–0.9)
MONO%: 9 % (ref 0.0–13.0)
NEUT#: 7.8 10*3/uL — ABNORMAL HIGH (ref 1.5–6.5)
NEUT%: 71 % (ref 40.0–80.0)
PLATELETS: 225 10*3/uL (ref 145–400)
RBC: 3.84 10*6/uL — ABNORMAL LOW (ref 4.20–5.70)
RDW: 14.3 % (ref 11.1–15.7)
WBC: 11 10*3/uL — AB (ref 4.0–10.0)

## 2014-01-28 LAB — LACTATE DEHYDROGENASE: LDH: 200 U/L (ref 94–250)

## 2014-01-28 MED ORDER — TEMOZOLOMIDE 140 MG PO CAPS: 140.0000 mg | ORAL_CAPSULE | Freq: Every day | ORAL | Status: DC

## 2014-01-28 MED ORDER — ONDANSETRON HCL 8 MG PO TABS: ORAL_TABLET | ORAL | Status: DC

## 2014-01-28 MED ORDER — SULFAMETHOXAZOLE-TRIMETHOPRIM 800-160 MG PO TABS: ORAL_TABLET | ORAL | Status: DC

## 2014-01-28 NOTE — Progress Notes (Signed)
  Radiation Oncology         [phone] ________________________________  Name: Edward [name] MRN: [id number]  Date: 01/29/2014  DOB: 1945/09/14  SIMULATION AND TREATMENT PLANNING NOTE  Outpatient  DIAGNOSIS:     ICD-9-CM ICD-10-CM  1. Glioblastoma 191.1 C71.1      NARRATIVE: The patient was brought to the [place] suite. Identity was confirmed. All relevant records and images related to the planned course of therapy were reviewed. The patient freely provided informed written consent to proceed with treatment after reviewing the details related to the planned course of therapy. The consent form was witnessed and verified by the simulation staff.  Then, the patient was set-up in a stable reproducible supine position for radiation therapy. Aquaplast mask fabricated for immobilization. CT images were obtained. Surface markings were placed. The CT images were loaded into the planning software.   TREATMENT PLANNING NOTE: Treatment planning then occurred. The radiation prescription was entered and confirmed.  A total of 1 medically necessary complex treatment devices were fabricated and supervised by me - aquaplast mask. I have requested :3D conformal simulation and treatment planning.   DVH of globes, brainstem, lenses, normal brain and target volumes requested.  The patient will receive a total dose of 46Gy in 23 fractions to his right frontal tumor bed + T2 FLAIR abnormality, with margin.  This will be followed by a boost of 14 Gy in 7 fraction to the right frontal tumor bed, based on T1 + contrast abnormality, with margin.   Special Treatment Procedure Note:  The patient will be receiving chemotherapy concurrently. Chemotherapy heightens the risk of side effects. I have considered this during the patient's treatment planning process and will monitor the patient accordingly for side effects on a weekly basis. Concurrent chemotherapy increases the complexity of this  patient's treatment and therefore this constitutes a special treatment procedure.   -----------------------------------  Eppie Gibson, MD

## 2014-01-28 NOTE — Patient Instructions (Signed)
Smoking Cessation Quitting smoking is important to your health and has many advantages. However, it is not always easy to quit since nicotine is a very addictive drug. Oftentimes, people try 3 times or more before being able to quit. This document explains the best ways for you to prepare to quit smoking. Quitting takes hard work and a lot of effort, but you can do it. ADVANTAGES OF QUITTING SMOKING  You will live longer, feel better, and live better.  Your body will feel the impact of quitting smoking almost immediately.  Within 20 minutes, blood pressure decreases. Your pulse returns to its normal level.  After 8 hours, carbon monoxide levels in the blood return to normal. Your oxygen level increases.  After 24 hours, the chance of having a heart attack starts to decrease. Your breath, hair, and body stop smelling like smoke.  After 48 hours, damaged nerve endings begin to recover. Your sense of taste and smell improve.  After 72 hours, the body is virtually free of nicotine. Your bronchial tubes relax and breathing becomes easier.  After 2 to 12 weeks, lungs can hold more air. Exercise becomes easier and circulation improves.  The risk of having a heart attack, stroke, cancer, or lung disease is greatly reduced.  After 1 year, the risk of coronary heart disease is cut in half.  After 5 years, the risk of stroke falls to the same as a nonsmoker.  After 10 years, the risk of lung cancer is cut in half and the risk of other cancers decreases significantly.  After 15 years, the risk of coronary heart disease drops, usually to the level of a nonsmoker.  If you are pregnant, quitting smoking will improve your chances of having a healthy baby.  The people you live with, especially any children, will be healthier.  You will have extra money to spend on things other than cigarettes. QUESTIONS TO THINK ABOUT BEFORE ATTEMPTING TO QUIT You may want to talk about your answers with your  health care provider.  Why do you want to quit?  If you tried to quit in the past, what helped and what did not?  What will be the most difficult situations for you after you quit? How will you plan to handle them?  Who can help you through the tough times? Your family? Friends? A health care provider?  What pleasures do you get from smoking? What ways can you still get pleasure if you quit? Here are some questions to ask your health care provider:  How can you help me to be successful at quitting?  What medicine do you think would be best for me and how should I take it?  What should I do if I need more help?  What is smoking withdrawal like? How can I get information on withdrawal? GET READY  Set a quit date.  Change your environment by getting rid of all cigarettes, ashtrays, matches, and lighters in your home, car, or work. Do not let people smoke in your home.  Review your past attempts to quit. Think about what worked and what did not. GET SUPPORT AND ENCOURAGEMENT You have a better chance of being successful if you have help. You can get support in many ways.  Tell your family, friends, and coworkers that you are going to quit and need their support. Ask them not to smoke around you.  Get individual, group, or telephone counseling and support. Programs are available at local hospitals and health centers. Call   your local health department for information about programs in your area.  Spiritual beliefs and practices may help some smokers quit.  Download a "quit meter" on your computer to keep track of quit statistics, such as how long you have gone without smoking, cigarettes not smoked, and money saved.  Get a self-help book about quitting smoking and staying off tobacco. LEARN NEW SKILLS AND BEHAVIORS  Distract yourself from urges to smoke. Talk to someone, go for a walk, or occupy your time with a task.  Change your normal routine. Take a different route to work.  Drink tea instead of coffee. Eat breakfast in a different place.  Reduce your stress. Take a hot bath, exercise, or read a book.  Plan something enjoyable to do every day. Reward yourself for not smoking.  Explore interactive web-based programs that specialize in helping you quit. GET MEDICINE AND USE IT CORRECTLY Medicines can help you stop smoking and decrease the urge to smoke. Combining medicine with the above behavioral methods and support can greatly increase your chances of successfully quitting smoking.  Nicotine replacement therapy helps deliver nicotine to your body without the negative effects and risks of smoking. Nicotine replacement therapy includes nicotine gum, lozenges, inhalers, nasal sprays, and skin patches. Some may be available over-the-counter and others require a prescription.  Antidepressant medicine helps people abstain from smoking, but how this works is unknown. This medicine is available by prescription.  Nicotinic receptor partial agonist medicine simulates the effect of nicotine in your brain. This medicine is available by prescription. Ask your health care provider for advice about which medicines to use and how to use them based on your health history. Your health care provider will tell you what side effects to look out for if you choose to be on a medicine or therapy. Carefully read the information on the package. Do not use any other product containing nicotine while using a nicotine replacement product.  RELAPSE OR DIFFICULT SITUATIONS Most relapses occur within the first 3 months after quitting. Do not be discouraged if you start smoking again. Remember, most people try several times before finally quitting. You may have symptoms of withdrawal because your body is used to nicotine. You may crave cigarettes, be irritable, feel very hungry, cough often, get headaches, or have difficulty concentrating. The withdrawal symptoms are only temporary. They are strongest  when you first quit, but they will go away within 10-14 days. To reduce the chances of relapse, try to:  Avoid drinking alcohol. Drinking lowers your chances of successfully quitting.  Reduce the amount of caffeine you consume. Once you quit smoking, the amount of caffeine in your body increases and can give you symptoms, such as a rapid heartbeat, sweating, and anxiety.  Avoid smokers because they can make you want to smoke.  Do not let weight gain distract you. Many smokers will gain weight when they quit, usually less than 10 pounds. Eat a healthy diet and stay active. You can always lose the weight gained after you quit.  Find ways to improve your mood other than smoking. FOR MORE INFORMATION  www.smokefree.gov  Document Released: 04/26/2001 Document Revised: 09/16/2013 Document Reviewed: 08/11/2011 [employer] Patient Information 2015 [employer]. This information is not intended to replace advice given to you by your health care provider. Make sure you discuss any questions you have with your health care provider.  

## 2014-01-29 ENCOUNTER — Ambulatory Visit
Admission: RE | Admit: 2014-01-29 | Discharge: 2014-01-29 | Disposition: A | Discharge status: Home / Self Care | Payer: BC Managed Care – PPO | Source: Ambulatory Visit | Attending: Radiation Oncology | Admitting: Radiation Oncology

## 2014-01-29 ENCOUNTER — Encounter: Payer: Self-pay | Admitting: Radiation Oncology

## 2014-01-29 DIAGNOSIS — C711 Malignant neoplasm of frontal lobe: Secondary | ICD-10-CM

## 2014-01-29 DIAGNOSIS — Z510 Encounter for antineoplastic radiation therapy: Secondary | ICD-10-CM | POA: Diagnosis not present

## 2014-01-29 NOTE — Progress Notes (Signed)
Hematology and Oncology Follow Up Visit  Edward [name] [id number] [birth year] 68 y.o. 01/29/2014   Principle Diagnosis:   Right frontal glioblastoma multiforme  History of localized renal cell carcinoma of the left kidney  Current Therapy:    Status post resection of glioblastoma     Interim History:  [name] is back for her first office visit. We saw him in consultation back on August 17. He at present with some mental status changes. He does have a large mass in the right frontal region of the brain.  He underwent a craniotomy. This was done on the 01/01/2014. A total resection was done. The pathology report ([id number]) show this to be a glioblastoma multiforme.  I do go ahead and have the tumor so for some genetic analysis. It is not methylated for MGMT.Marland Kitchen  He went to see a neuro- oncologist at [facility]. They gave him recommendation as to [place] we did. He would need adjuvant chemotherapy and radiation therapy.  [place]  He is doing well. He is healed up from his surgery. He's more alert. He is back to doing crossword puzzles. He is talking well. He is making good sense with talking.  There's no seizures. He is on Decadron right now.  . I talked him about watching his sugar intake.  He's had no problems with bowels or bladder. He is a little constipated but does take some stool softeners.  He's had no fever. He's had no bleeding.  Overall, his performance status is ECOG 1.  Medications: Current outpatient prescriptions:allopurinol (ZYLOPRIM) 100 MG tablet, Take 100 mg by mouth daily. , Disp: , Rfl: ;  buPROPion (WELLBUTRIN XL) 150 MG 24 hr tablet, Take 150 mg by mouth daily. Trying to stop, Disp: , Rfl: ;  buPROPion (WELLBUTRIN XL) 300 MG 24 hr tablet, Take 300 mg by mouth daily., Disp: , Rfl: ;  citalopram (CELEXA) 40 MG tablet, Take 1 tablet (40 mg total) by mouth daily., Disp: 30 tablet, Rfl: 2 clonazePAM (KLONOPIN) 0.5 MG  tablet, Take 0.5 mg by mouth at bedtime. , Disp: , Rfl: ;  dexamethasone (DECADRON) 4 MG tablet, Take 1 tablet (4 mg total) by mouth every 8 (eight) hours., Disp: 30 tablet, Rfl: 0;  furosemide (LASIX) 80 MG tablet, , Disp: , Rfl: ;  HYDROcodone-acetaminophen (NORCO/VICODIN) 5-325 MG per tablet, Take 1 tablet by mouth every 6 (six) hours as needed. For pain, Disp: 10 tablet, Rfl: 0 KLOR-CON M20 20 MEQ tablet, , Disp: , Rfl: ;  levETIRAcetam (KEPPRA) 500 MG tablet, Take 1 tablet (500 mg total) by mouth 2 (two) times daily., Disp: 60 tablet, Rfl: 1;  LIPITOR 40 MG tablet, Take 40 mg by mouth daily. , Disp: , Rfl: ;  metoprolol succinate (TOPROL-XL) 100 MG 24 hr tablet, Take 100 mg by mouth daily. , Disp: , Rfl: ;  Multiple Vitamins-Minerals (MULTIVITAMIN PO), Take 1 tablet by mouth daily., Disp: , Rfl:  omeprazole (PRILOSEC) 40 MG capsule, Take 40 mg by mouth daily. , Disp: , Rfl: ;  Promethazine HCl (PHENERGAN PO), Take by mouth as needed., Disp: , Rfl: ;  ramipril (ALTACE) 5 MG capsule, , Disp: , Rfl: ;  zolpidem (AMBIEN) 10 MG tablet, Take 10 mg by mouth at bedtime. , Disp: , Rfl: ;  ondansetron (ZOFRAN) 8 MG tablet, Take 1 pill 18min prior to Temodar, Disp: 30 tablet, Rfl: 3 sulfamethoxazole-trimethoprim (BACTRIM DS,SEPTRA DS) 800-160 MG per tablet, Take 1 pill a  day, Disp: 30 tablet, Rfl: 4;  temozolomide (TEMODAR) 140 MG capsule, Take 1 capsule (140 mg total) by mouth daily. May take on an empty stomach or at bedtime to decrease nausea & vomiting., Disp: 50 capsule, Rfl: 0  Allergies:  Allergies  Allergen Reactions  . Nsaids Other (See Comments)    Not good for kidney    Past Medical History, Surgical history, Social history, and Family History were reviewed and updated.  Review of Systems: As above  Physical Exam:  height is 6' (1.829 m) and weight is 239 lb (108.41 kg). His oral temperature is 98.1 F (36.7 C). His blood pressure is 153/82 and his pulse is 54. His respiration is 18.   Will  come well-nourished white gentleman. Head and exam shows a craniotomy scar in the right frontal region. She has no ocular or oral lesions. There are no palpable cervical or supraclavicular lymph nodes. Lungs are clear. Cardiac exam regular in rhythm with no murmurs rubs or bruits. Abdomen is soft. She has good bowel sounds. There is no fluid wave. There is no palpable liver or spleen tip. Exam no tenderness over the spine, ribs or hips. Extremities shows no clubbing, cyanosis or edema. Has good range of motion of his joints. Has good strength. Neurological exam shows no obvious neurological deficits. He has intact cranial nerves. He has good sensory perception. He has good vocal ability.  Lab Results  Component Value Date   WBC 11.0* 01/28/2014   HGB 11.8* 01/28/2014   HCT 36.1* 01/28/2014   MCV 94 01/28/2014   PLT 225 01/28/2014     Chemistry      Component Value Date/Time   NA 140 01/28/2014 0801   K 4.7 01/28/2014 0801   CL 106 01/28/2014 0801   CO2 28 01/28/2014 0801   BUN 31* 01/28/2014 0801   CREATININE 1.91* 01/28/2014 0801      Component Value Date/Time   CALCIUM 8.7 01/28/2014 0801   ALKPHOS 78 01/28/2014 0801   AST 14 01/28/2014 0801   ALT 30 01/28/2014 0801   BILITOT 0.4 01/28/2014 0801         Impression and Plan: Edward [name] is a 68 year old white gentleman.. He has a glioblastoma. This was resected. It looks like from the operative report, that is the total resection.  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 This typically is a worse prognosis. It typically indicates a decreased response to chemotherapy. However, I think that combined modality therapy is still appropriate.  We will go ahead and use Temodar. I talked to her pharmacist. There is no dose reduction with renal insufficiency. Since he only has one kidney, he has some mild renal insufficiency. He is urinating well. Is no evidence of problems with his kidney.  I calculated his radiation dose for Temodar to be 140 mg  per day. This worked up for her well for [place].  I went over the side effects with he and his wife. I explained to him that his blood counts may go down a little bit. I explained that he may have fatigue. There maybe some nausea. I went ahead and gave him a prescription for Zofran.  I also told him about the possibility of atypical pneumonia. I want to get him on the Bactrim. I this also will help.  I think that he will start treatment in a day or so. Again, he is seeing radiation oncology tomorrow.  Again, I think that he should do okay. He is a  good performance status.  I spent about one hour he and his wife. I answered all their questions.  We will plan to see him back in about 3 weeks.   Volanda Napoleon, MD 9/16/20156:17 AM

## 2014-01-30 ENCOUNTER — Encounter: Payer: Self-pay | Admitting: Radiation Oncology

## 2014-01-30 NOTE — Progress Notes (Signed)
FMLA paperwork received for mother, OMAREE FUQUA.  Forwarded to Dr. Isidore Moos for signature (already completed by Magda Paganini).

## 2014-01-31 ENCOUNTER — Encounter: Payer: Self-pay | Admitting: Hematology & Oncology

## 2014-02-03 ENCOUNTER — Encounter: Payer: Self-pay | Admitting: Hematology & Oncology

## 2014-02-03 DIAGNOSIS — Z510 Encounter for antineoplastic radiation therapy: Secondary | ICD-10-CM | POA: Diagnosis not present

## 2014-02-04 ENCOUNTER — Encounter: Payer: Self-pay | Admitting: Radiation Oncology

## 2014-02-04 ENCOUNTER — Encounter: Payer: Self-pay | Admitting: *Deleted

## 2014-02-04 NOTE — Progress Notes (Signed)
9.22.15:  Faxed FMLA paperwork to [employer], attn:  Carrolyn Leigh [phone]) per patient request as well as mailed a hard copy to same: [place] [address].  Made copy for scanning and provided patient with copies of paperwork as well as fax confirmation.

## 2014-02-05 ENCOUNTER — Ambulatory Visit: Payer: BC Managed Care – PPO | Admitting: Radiation Oncology

## 2014-02-06 ENCOUNTER — Encounter: Payer: Self-pay | Admitting: Radiation Oncology

## 2014-02-06 ENCOUNTER — Ambulatory Visit
Admission: RE | Admit: 2014-02-06 | Discharge: 2014-02-06 | Disposition: A | Discharge status: Home / Self Care | Payer: BC Managed Care – PPO | Source: Ambulatory Visit | Attending: Radiation Oncology | Admitting: Radiation Oncology

## 2014-02-06 DIAGNOSIS — Z510 Encounter for antineoplastic radiation therapy: Secondary | ICD-10-CM | POA: Diagnosis not present

## 2014-02-07 ENCOUNTER — Ambulatory Visit
Admission: RE | Admit: 2014-02-07 | Discharge: 2014-02-07 | Disposition: A | Discharge status: Home / Self Care | Payer: BC Managed Care – PPO | Source: Ambulatory Visit | Attending: Radiation Oncology | Admitting: Radiation Oncology

## 2014-02-07 DIAGNOSIS — Z510 Encounter for antineoplastic radiation therapy: Secondary | ICD-10-CM | POA: Diagnosis not present

## 2014-02-10 ENCOUNTER — Ambulatory Visit
Admission: RE | Admit: 2014-02-10 | Discharge: 2014-02-10 | Disposition: A | Discharge status: Home / Self Care | Payer: BC Managed Care – PPO | Source: Ambulatory Visit | Attending: Radiation Oncology | Admitting: Radiation Oncology

## 2014-02-10 VITALS — BP 140/81 | HR 65 | Temp 98.3°F | Resp 12 | Wt 234.3 lb

## 2014-02-10 DIAGNOSIS — Z510 Encounter for antineoplastic radiation therapy: Secondary | ICD-10-CM | POA: Diagnosis not present

## 2014-02-10 DIAGNOSIS — C711 Malignant neoplasm of frontal lobe: Secondary | ICD-10-CM

## 2014-02-10 NOTE — Progress Notes (Signed)
   Weekly Management Note:  Outpatient    ICD-9-CM ICD-10-CM  1. Glioblastoma 191.1 C71.1    Current Dose:  6 Gy  Projected Dose: 60 Gy   Narrative:  The patient presents for routine under treatment assessment.  CBCT/MVCT images/Port film x-rays were reviewed.  The chart was checked. He is doing relatively well, taking Temodar. On Decadron 2mg  BID. Sees [facility] on Wed (9-30)  Physical Findings:  weight is 234 lb 4.8 oz (106.278 kg). His oral temperature is 98.3 F (36.8 C). His blood pressure is 140/81 and his pulse is 65. His respiration is 12 and oxygen saturation is 97%.  No thrush.  CBC    Component Value Date/Time   WBC 11.0* 01/28/2014 0801   WBC 16.3* 01/06/2014 0430   RBC 3.84* 01/28/2014 0801   RBC 3.70* 01/06/2014 0430   HGB 11.8* 01/28/2014 0801   HGB 11.3* 01/06/2014 0430   HCT 36.1* 01/28/2014 0801   HCT 34.6* 01/06/2014 0430   PLT 225 01/28/2014 0801   PLT 250 01/06/2014 0430   MCV 94 01/28/2014 0801   MCV 93.5 01/06/2014 0430   MCH 30.7 01/28/2014 0801   MCH 30.5 01/06/2014 0430   MCHC 32.7 01/28/2014 0801   MCHC 32.7 01/06/2014 0430   RDW 14.3 01/28/2014 0801   RDW 13.4 01/06/2014 0430   LYMPHSABS 2.0 01/28/2014 0801   LYMPHSABS 2.7 12/29/2013 2006   MONOABS 1.3* 12/29/2013 2006   EOSABS 0.2 01/28/2014 0801   EOSABS 0.3 12/29/2013 2006   BASOSABS 0.0 01/28/2014 0801   BASOSABS 0.1 12/29/2013 2006     CMP     Component Value Date/Time   NA 140 01/28/2014 0801   K 4.7 01/28/2014 0801   CL 106 01/28/2014 0801   CO2 28 01/28/2014 0801   GLUCOSE 73 01/28/2014 0801   BUN 31* 01/28/2014 0801   CREATININE 1.91* 01/28/2014 0801   CALCIUM 8.7 01/28/2014 0801   PROT 5.5* 01/28/2014 0801   ALBUMIN 3.3* 01/28/2014 0801   AST 14 01/28/2014 0801   ALT 30 01/28/2014 0801   ALKPHOS 78 01/28/2014 0801   BILITOT 0.4 01/28/2014 0801   GFRNONAA 50* 01/06/2014 0430   GFRAA 57* 01/06/2014 0430     Impression:  The patient is tolerating radiotherapy.   Plan:  Continue radiotherapy as planned. To  discuss w/ Dr Arnoldo Morale whether to continue Keppra (no seizures in past).  -----------------------------------  Eppie Gibson, MD

## 2014-02-10 NOTE — Progress Notes (Signed)
He is currently in no pain. Pt complains of, Fatigue, dry mouth and weak eyes.  Pt alert & oriented x 3 with fluent speech, reflexes normal and symmetric. Pt reports negative for visual blurring, double vision, eye pain. PERRLA, reports eyes get "weak" when reading. 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  Pt presenting appropriate quality, quantity and organization of sentences. Oral exam reveal moist mucous membranes, with thick sputum and a moderate amount of white exudate.

## 2014-02-11 ENCOUNTER — Ambulatory Visit
Admission: RE | Admit: 2014-02-11 | Discharge: 2014-02-11 | Disposition: A | Discharge status: Home / Self Care | Payer: BC Managed Care – PPO | Source: Ambulatory Visit | Attending: Radiation Oncology | Admitting: Radiation Oncology

## 2014-02-11 DIAGNOSIS — Z510 Encounter for antineoplastic radiation therapy: Secondary | ICD-10-CM | POA: Diagnosis not present

## 2014-02-12 ENCOUNTER — Ambulatory Visit
Admission: RE | Admit: 2014-02-12 | Discharge: 2014-02-12 | Disposition: A | Discharge status: Home / Self Care | Payer: BC Managed Care – PPO | Source: Ambulatory Visit | Attending: Radiation Oncology | Admitting: Radiation Oncology

## 2014-02-12 DIAGNOSIS — Z510 Encounter for antineoplastic radiation therapy: Secondary | ICD-10-CM | POA: Diagnosis not present

## 2014-02-13 ENCOUNTER — Ambulatory Visit
Admission: RE | Admit: 2014-02-13 | Discharge: 2014-02-13 | Disposition: A | Discharge status: Home / Self Care | Payer: BC Managed Care – PPO | Source: Ambulatory Visit | Attending: Radiation Oncology | Admitting: Radiation Oncology

## 2014-02-13 DIAGNOSIS — Z510 Encounter for antineoplastic radiation therapy: Secondary | ICD-10-CM | POA: Insufficient documentation

## 2014-02-13 DIAGNOSIS — C711 Malignant neoplasm of frontal lobe: Secondary | ICD-10-CM | POA: Insufficient documentation

## 2014-02-14 ENCOUNTER — Ambulatory Visit
Admission: RE | Admit: 2014-02-14 | Discharge: 2014-02-14 | Disposition: A | Discharge status: Home / Self Care | Payer: BC Managed Care – PPO | Source: Ambulatory Visit | Attending: Radiation Oncology | Admitting: Radiation Oncology

## 2014-02-14 DIAGNOSIS — Z510 Encounter for antineoplastic radiation therapy: Secondary | ICD-10-CM | POA: Diagnosis not present

## 2014-02-17 ENCOUNTER — Encounter: Payer: Self-pay | Admitting: Radiation Oncology

## 2014-02-17 ENCOUNTER — Ambulatory Visit
Admission: RE | Admit: 2014-02-17 | Discharge: 2014-02-17 | Disposition: A | Discharge status: Home / Self Care | Payer: BC Managed Care – PPO | Source: Ambulatory Visit | Attending: Radiation Oncology | Admitting: Radiation Oncology

## 2014-02-17 VITALS — BP 126/77 | HR 68 | Resp 16 | Wt 235.0 lb

## 2014-02-17 DIAGNOSIS — C711 Malignant neoplasm of frontal lobe: Secondary | ICD-10-CM

## 2014-02-17 DIAGNOSIS — Z510 Encounter for antineoplastic radiation therapy: Secondary | ICD-10-CM | POA: Diagnosis not present

## 2014-02-17 NOTE — Progress Notes (Signed)
   Weekly Management Note:  Outpatient    ICD-9-CM ICD-10-CM  1. Glioblastoma 191.1 C71.1    Current Dose:  16 Gy  Projected Dose: 60 Gy   Narrative:  The patient presents for routine under treatment assessment.  CBCT/MVCT images/Port film x-rays were reviewed.  The chart was checked. Reports forgetfulness (forgot to apply handicapped placard to car) but denies forgetting major things and doesn't get lost while driving. No HA, No Nausea.  "I feel great." Depression is better than it has been in a while.  4 days ago, self tapered Decadron from 4mg  in am and 2mg  in pm to just 2mg  in am, none in pm.  Vision worsened after surgery.  Physical Findings:  weight is 235 lb (106.595 kg). His blood pressure is 126/77 and his pulse is 68. His respiration is 16.  NAD, no thrush  CBC    Component Value Date/Time   WBC 11.0* 01/28/2014 0801   WBC 16.3* 01/06/2014 0430   RBC 3.84* 01/28/2014 0801   RBC 3.70* 01/06/2014 0430   HGB 11.8* 01/28/2014 0801   HGB 11.3* 01/06/2014 0430   HCT 36.1* 01/28/2014 0801   HCT 34.6* 01/06/2014 0430   PLT 225 01/28/2014 0801   PLT 250 01/06/2014 0430   MCV 94 01/28/2014 0801   MCV 93.5 01/06/2014 0430   MCH 30.7 01/28/2014 0801   MCH 30.5 01/06/2014 0430   MCHC 32.7 01/28/2014 0801   MCHC 32.7 01/06/2014 0430   RDW 14.3 01/28/2014 0801   RDW 13.4 01/06/2014 0430   LYMPHSABS 2.0 01/28/2014 0801   LYMPHSABS 2.7 12/29/2013 2006   MONOABS 1.3* 12/29/2013 2006   EOSABS 0.2 01/28/2014 0801   EOSABS 0.3 12/29/2013 2006   BASOSABS 0.0 01/28/2014 0801   BASOSABS 0.1 12/29/2013 2006     Impression:  The patient is tolerating radiotherapy.  Plan:  Continue radiotherapy as planned. Advised pt to speak with MD before tapering Decadron further.  Discussed brain tumor support group for himself and his wife. Will ask [facility] to provide more info for them. See eye doctor to help vision. ________________________________   Eppie Gibson, M.D.

## 2014-02-17 NOTE — Progress Notes (Signed)
Patient very frustrated today. States, "I keep doing stupid stuff." Listened to patient and attempted to reassure him. Patient taking decadron 2mg  once per day. Reports his vision has diminished greatly but, denies diplopia or floaters. Right frontal incision well healed without redness or edema. Denies headache. Denies ringing in the ears. Vitals and weight stable. Reports short term memory is not good. Reports left hand weakness has improved. Reports he is eating and sleeping without difficulty.

## 2014-02-18 ENCOUNTER — Ambulatory Visit
Admission: RE | Admit: 2014-02-18 | Discharge: 2014-02-18 | Disposition: A | Discharge status: Home / Self Care | Payer: BC Managed Care – PPO | Source: Ambulatory Visit | Attending: Radiation Oncology | Admitting: Radiation Oncology

## 2014-02-18 DIAGNOSIS — Z510 Encounter for antineoplastic radiation therapy: Secondary | ICD-10-CM | POA: Diagnosis not present

## 2014-02-19 ENCOUNTER — Ambulatory Visit
Admission: RE | Admit: 2014-02-19 | Discharge: 2014-02-19 | Disposition: A | Discharge status: Home / Self Care | Payer: BC Managed Care – PPO | Source: Ambulatory Visit | Attending: Radiation Oncology | Admitting: Radiation Oncology

## 2014-02-19 DIAGNOSIS — Z510 Encounter for antineoplastic radiation therapy: Secondary | ICD-10-CM | POA: Diagnosis not present

## 2014-02-20 ENCOUNTER — Ambulatory Visit
Admission: RE | Admit: 2014-02-20 | Discharge: 2014-02-20 | Disposition: A | Discharge status: Home / Self Care | Payer: BC Managed Care – PPO | Source: Ambulatory Visit | Attending: Radiation Oncology | Admitting: Radiation Oncology

## 2014-02-20 ENCOUNTER — Encounter: Payer: Self-pay | Admitting: Hematology & Oncology

## 2014-02-20 ENCOUNTER — Ambulatory Visit (HOSPITAL_BASED_OUTPATIENT_CLINIC_OR_DEPARTMENT_OTHER): Payer: BC Managed Care – PPO | Admitting: Hematology & Oncology

## 2014-02-20 ENCOUNTER — Other Ambulatory Visit (HOSPITAL_BASED_OUTPATIENT_CLINIC_OR_DEPARTMENT_OTHER): Payer: BC Managed Care – PPO | Admitting: Lab

## 2014-02-20 VITALS — BP 132/80 | HR 73 | Temp 98.0°F | Resp 18 | Ht 70.0 in | Wt 238.0 lb

## 2014-02-20 DIAGNOSIS — C711 Malignant neoplasm of frontal lobe: Secondary | ICD-10-CM

## 2014-02-20 DIAGNOSIS — Z510 Encounter for antineoplastic radiation therapy: Secondary | ICD-10-CM | POA: Diagnosis not present

## 2014-02-20 LAB — CBC WITH DIFFERENTIAL (CANCER CENTER ONLY)
BASO#: 0.1 10*3/uL (ref 0.0–0.2)
BASO%: 0.6 % (ref 0.0–2.0)
EOS%: 1.9 % (ref 0.0–7.0)
Eosinophils Absolute: 0.3 10*3/uL (ref 0.0–0.5)
HEMATOCRIT: 40 % (ref 38.7–49.9)
HEMOGLOBIN: 13 g/dL (ref 13.0–17.1)
LYMPH#: 1.2 10*3/uL (ref 0.9–3.3)
LYMPH%: 8.4 % — ABNORMAL LOW (ref 14.0–48.0)
MCH: 30.2 pg (ref 28.0–33.4)
MCHC: 32.5 g/dL (ref 32.0–35.9)
MCV: 93 fL (ref 82–98)
MONO#: 1.2 10*3/uL — ABNORMAL HIGH (ref 0.1–0.9)
MONO%: 8.5 % (ref 0.0–13.0)
NEUT#: 11.5 10*3/uL — ABNORMAL HIGH (ref 1.5–6.5)
NEUT%: 80.6 % — AB (ref 40.0–80.0)
Platelets: 243 10*3/uL (ref 145–400)
RBC: 4.3 10*6/uL (ref 4.20–5.70)
RDW: 15.1 % (ref 11.1–15.7)
WBC: 14.2 10*3/uL — ABNORMAL HIGH (ref 4.0–10.0)

## 2014-02-20 LAB — CMP (CANCER CENTER ONLY)
ALT(SGPT): 65 U/L — ABNORMAL HIGH (ref 10–47)
AST: 39 U/L — ABNORMAL HIGH (ref 11–38)
Albumin: 3.2 g/dL — ABNORMAL LOW (ref 3.3–5.5)
Alkaline Phosphatase: 81 U/L (ref 26–84)
BILIRUBIN TOTAL: 0.7 mg/dL (ref 0.20–1.60)
BUN, Bld: 46 mg/dL — ABNORMAL HIGH (ref 7–22)
CALCIUM: 9.6 mg/dL (ref 8.0–10.3)
CO2: 27 meq/L (ref 18–33)
Chloride: 106 mEq/L (ref 98–108)
Creat: 2.6 mg/dl — ABNORMAL HIGH (ref 0.6–1.2)
GLUCOSE: 78 mg/dL (ref 73–118)
Potassium: 4.5 mEq/L (ref 3.3–4.7)
SODIUM: 146 meq/L — AB (ref 128–145)
TOTAL PROTEIN: 6.8 g/dL (ref 6.4–8.1)

## 2014-02-20 LAB — PREALBUMIN: Prealbumin: 35.5 mg/dL — ABNORMAL HIGH (ref 17.0–34.0)

## 2014-02-20 LAB — TECHNOLOGIST REVIEW CHCC SATELLITE

## 2014-02-20 LAB — LACTATE DEHYDROGENASE: LDH: 288 U/L — ABNORMAL HIGH (ref 94–250)

## 2014-02-21 ENCOUNTER — Ambulatory Visit
Admission: RE | Admit: 2014-02-21 | Discharge: 2014-02-21 | Disposition: A | Discharge status: Home / Self Care | Payer: BC Managed Care – PPO | Source: Ambulatory Visit | Attending: Radiation Oncology | Admitting: Radiation Oncology

## 2014-02-21 DIAGNOSIS — Z510 Encounter for antineoplastic radiation therapy: Secondary | ICD-10-CM | POA: Diagnosis not present

## 2014-02-22 NOTE — Progress Notes (Signed)
Hematology and Oncology Follow Up Visit  Edward [name] [id number] 04/20/1946 68 y.o. 02/22/2014   Principle Diagnosis:   Right frontal glioblastoma multiforme  History of localized renal cell carcinoma of the left kidney  Current Therapy:    Radiation therapy with low-dose Temodar     Interim History:  Mr.  Edward [name] is back for followup. He says he really feels well. Is eating well. His weight is going up. I suspect this probably is from the Decadron. He is only on 2 mg a day.  He's had no nausea or vomiting. He's had no cough. He's had no headache. He's had no change in bowel or bladder habits.  He goes back to [facility] to see the doctors there in November.  He's had no rashes. He's had no leg swelling.  He is seen the neurosurgeon in town here. I don't think he needs to see him any longer. Medications: Current outpatient prescriptions:allopurinol (ZYLOPRIM) 100 MG tablet, Take 100 mg by mouth daily. , Disp: , Rfl: ;  buPROPion (WELLBUTRIN XL) 150 MG 24 hr tablet, Take 150 mg by mouth daily. Trying to stop, Disp: , Rfl: ;  buPROPion (WELLBUTRIN XL) 300 MG 24 hr tablet, Take 300 mg by mouth daily., Disp: , Rfl: ;  citalopram (CELEXA) 40 MG tablet, Take 1 tablet (40 mg total) by mouth daily., Disp: 30 tablet, Rfl: 2 clonazePAM (KLONOPIN) 0.5 MG tablet, Take 0.5 mg by mouth at bedtime. , Disp: , Rfl: ;  dexamethasone (DECADRON) 2 MG tablet, Take 2 mg by mouth daily., Disp: , Rfl: ;  furosemide (LASIX) 80 MG tablet, Take 80 mg by mouth daily. , Disp: , Rfl: ;  HYDROcodone-acetaminophen (NORCO/VICODIN) 5-325 MG per tablet, Take 1 tablet by mouth every 6 (six) hours as needed. For pain, Disp: 10 tablet, Rfl: 0 KLOR-CON M20 20 MEQ tablet, Take 20 mEq by mouth once. , Disp: , Rfl: ;  levETIRAcetam (KEPPRA) 500 MG tablet, Take 1 tablet (500 mg total) by mouth 2 (two) times daily., Disp: 60 tablet, Rfl: 1;  LIPITOR 40 MG tablet, Take 40 mg by mouth daily. , Disp: , Rfl: ;  metoprolol succinate  (TOPROL-XL) 100 MG 24 hr tablet, Take 100 mg by mouth daily. , Disp: , Rfl:  Multiple Vitamins-Minerals (MULTIVITAMIN PO), Take 1 tablet by mouth daily., Disp: , Rfl: ;  omeprazole (PRILOSEC) 40 MG capsule, Take 40 mg by mouth daily. , Disp: , Rfl: ;  Promethazine HCl (PHENERGAN PO), Take by mouth as needed., Disp: , Rfl: ;  ramipril (ALTACE) 5 MG capsule, Take 5 mg by mouth daily. , Disp: , Rfl: ;  sulfamethoxazole-trimethoprim (BACTRIM DS,SEPTRA DS) 800-160 MG per tablet, Take 1 pill a day, Disp: 30 tablet, Rfl: 4 temozolomide (TEMODAR) 140 MG capsule, Take 1 capsule (140 mg total) by mouth daily. May take on an empty stomach or at bedtime to decrease nausea & vomiting., Disp: 50 capsule, Rfl: 0;  zolpidem (AMBIEN) 10 MG tablet, Take 10 mg by mouth at bedtime. , Disp: , Rfl: ;  ondansetron (ZOFRAN) 8 MG tablet, Take 1 pill 28min prior to Temodar, Disp: 30 tablet, Rfl: 3  Allergies:  Allergies  Allergen Reactions  . Nsaids Other (See Comments)    Not good for kidney    Past Medical History, Surgical history, Social history, and Family History were reviewed and updated.  Review of Systems: As above  Physical Exam:  height is 5\' 10"  (1.778 m) and weight is 238 lb (107.956 kg). His oral  temperature is 98 F (36.7 C). His blood pressure is 132/80 and his pulse is 73. His respiration is 18.   Well-developed and well-nourished white gentleman. Head and exam shows a craniotomy scar in the right frontal region. He has no ocular or oral lesions. There are no palpable cervical or supraclavicular lymph nodes. Lungs are clear. Cardiac exam regular in rhythm with no murmurs rubs or bruits. Abdomen is soft. He has good bowel sounds. There is no fluid wave. There is no palpable liver or spleen tip. Exam no tenderness over the spine, ribs or hips. Extremities shows no clubbing, cyanosis or edema. He has good range of motion of his joints. He has good strength. Neurological exam shows no obvious neurological  deficits. He has intact cranial nerves. He has good sensory perception. He has good vocal ability.  Lab Results  Component Value Date   WBC 14.2* 02/20/2014   HGB 13.0 02/20/2014   HCT 40.0 02/20/2014   MCV 93 02/20/2014   PLT 243 02/20/2014     Chemistry      Component Value Date/Time   NA 146* 02/20/2014 0916   NA 140 01/28/2014 0801   K 4.5 02/20/2014 0916   K 4.7 01/28/2014 0801   CL 106 02/20/2014 0916   CL 106 01/28/2014 0801   CO2 27 02/20/2014 0916   CO2 28 01/28/2014 0801   BUN 46* 02/20/2014 0916   BUN 31* 01/28/2014 0801   CREATININE 2.6* 02/20/2014 0916   CREATININE 1.91* 01/28/2014 0801      Component Value Date/Time   CALCIUM 9.6 02/20/2014 0916   CALCIUM 8.7 01/28/2014 0801   ALKPHOS 81 02/20/2014 0916   ALKPHOS 78 01/28/2014 0801   AST 39* 02/20/2014 0916   AST 14 01/28/2014 0801   ALT 65* 02/20/2014 0916   ALT 30 01/28/2014 0801   BILITOT 0.70 02/20/2014 0916   BILITOT 0.4 01/28/2014 0801         Impression and Plan: Mr. Edward [name] is 68 year old gentleman He a resected glioblastoma of the right frontal lobe. He is on adjuvant therapy right now.  He is doing well. He is tolerating treatment well.  His renal function is lower. His creatinine is 2.6. I'm not sure as to why that is. He is on quite a lot of medications.  This will need to be watched closely. We will need to make sure that he has lab work done next week. He can have this done at the main [facility].  I want to see him back in November. He'll have his MRI done at [facility].     Volanda Napoleon, MD 10/10/20158:20 AM

## 2014-02-24 ENCOUNTER — Inpatient Hospital Stay
Admission: RE | Admit: 2014-02-24 | Discharge: 2014-02-24 | Disposition: A | Discharge status: Home / Self Care | Payer: BC Managed Care – PPO | Source: Ambulatory Visit | Attending: Radiation Oncology | Admitting: Radiation Oncology

## 2014-02-24 ENCOUNTER — Ambulatory Visit
Admission: RE | Admit: 2014-02-24 | Discharge: 2014-02-24 | Disposition: A | Discharge status: Home / Self Care | Payer: BC Managed Care – PPO | Source: Ambulatory Visit | Attending: Radiation Oncology | Admitting: Radiation Oncology

## 2014-02-24 ENCOUNTER — Telehealth: Payer: Self-pay | Admitting: Hematology & Oncology

## 2014-02-24 ENCOUNTER — Encounter: Payer: Self-pay | Admitting: Radiation Oncology

## 2014-02-24 VITALS — BP 106/67 | HR 74 | Temp 99.6°F | Resp 20 | Wt 240.4 lb

## 2014-02-24 DIAGNOSIS — Z510 Encounter for antineoplastic radiation therapy: Secondary | ICD-10-CM | POA: Diagnosis not present

## 2014-02-24 DIAGNOSIS — C711 Malignant neoplasm of frontal lobe: Secondary | ICD-10-CM

## 2014-02-24 MED ORDER — BIAFINE EX EMUL
Freq: Every day | CUTANEOUS | Status: DC
  Administered 2014-02-24: 13:00:00 via TOPICAL

## 2014-02-24 NOTE — Telephone Encounter (Signed)
Pt aware of 10-15 lab at [facility]

## 2014-02-24 NOTE — Addendum Note (Signed)
Encounter addended by: Rebecca Eaton, RN on: 02/24/2014  1:23 PM<BR>     Documentation filed: Notes Section, Inpatient Document Flowsheet, Inpatient [facility]

## 2014-02-24 NOTE — Progress Notes (Addendum)
Weekly rad txs right frontal lobe 13/30 completed, patient fell this am at home, on rug, muscle control decreased feels bad,  can hardly walk, this started Saturday, doesn't have a walker, on decadron 2mg  daily,may have thrush,  Low grade temp 99.6, this instability with walking and coordination is new, is taking Temodar, and Keppra also,  no nausea or blurred vision stated, in w/c, wife concerned she will not be able to help patient get up at home

## 2014-02-24 NOTE — Progress Notes (Signed)
Weekly Management Note:  Site: Right frontal brain/GBM Current Dose:  2600  cGy Projected Dose: 4600  CGy followed by boost  Narrative: The patient is seen today for routine under treatment assessment. CBCT/MVCT images/port films were reviewed. The chart was reviewed.   This morning he developed a headache and fell at home. No significant trauma. He feels unsteady when walking. He is currently taking dexamethasone 2 mg by mouth daily.  Physical Examination:  Filed Vitals:   02/24/14 1136  BP: 106/67  Pulse: 74  Temp: 99.6 F (37.6 C)  Resp: 20  .  Weight: 240 lb 6.4 oz (109.045 kg). Alert and oriented. Cerebellar finger to nose testing diminished on the left. Gait not tested.  Impression: With his history of morning headaches and unsteadiness, I wonder if he is having tumor bed edema. I will increase his dexamethasone to 4 mg by mouth twice a day (two 2 mg tabs in the morning and two 2 mg tabs in the evening.)  Plan: Continue radiation therapy as planned. I'll see him again this Wednesday.

## 2014-02-24 NOTE — Addendum Note (Signed)
Encounter addended by: Rebecca Eaton, RN on: 02/24/2014  1:17 PM<BR>     Documentation filed: Inpatient Patient Education, Orders

## 2014-02-24 NOTE — Progress Notes (Signed)
patienttgiven post sim education after MD had seen the patient, rad book, biafine cream given to patient discussed with him and wife, skin irritation,fatigue, head aches, nausea,vomiting, healing loss possibility, pain, vision changes, ,to apply the biafine cream to forehead after rad tx daily, also scheduled to see MD this Wed, after rad txs, MD increased patient's decadron, all questions answered, teach back given, encouraged patient not to drive, wife works and said neighbors may have to bring him, suggested I contact social workers, and may be able to get [place] transportation for patient, patient declined, wife wants Education officer, museum to call and discuss 1:23 PM

## 2014-02-25 ENCOUNTER — Ambulatory Visit
Admission: RE | Admit: 2014-02-25 | Discharge: 2014-02-25 | Disposition: A | Discharge status: Home / Self Care | Payer: BC Managed Care – PPO | Source: Ambulatory Visit | Attending: Radiation Oncology | Admitting: Radiation Oncology

## 2014-02-25 DIAGNOSIS — Z510 Encounter for antineoplastic radiation therapy: Secondary | ICD-10-CM | POA: Diagnosis not present

## 2014-02-26 ENCOUNTER — Ambulatory Visit
Admission: RE | Admit: 2014-02-26 | Discharge: 2014-02-26 | Disposition: A | Discharge status: Home / Self Care | Payer: BC Managed Care – PPO | Source: Ambulatory Visit | Attending: Radiation Oncology | Admitting: Radiation Oncology

## 2014-02-26 ENCOUNTER — Encounter: Payer: Self-pay | Admitting: Radiation Oncology

## 2014-02-26 VITALS — BP 104/62 | HR 67 | Temp 98.7°F | Ht 70.0 in | Wt 239.6 lb

## 2014-02-26 DIAGNOSIS — C711 Malignant neoplasm of frontal lobe: Secondary | ICD-10-CM

## 2014-02-26 DIAGNOSIS — Z510 Encounter for antineoplastic radiation therapy: Secondary | ICD-10-CM | POA: Diagnosis not present

## 2014-02-26 NOTE — Progress Notes (Signed)
CC: Dr. Isidore Moos  Chart note: The patient is seen today after increasing his dexamethasone to 4 mg by mouth twice a day beginning late Monday. He states that his headaches and unsteadiness have markedly improved. He tells me that he drove to the clinic today for his treatment. Impression: Clinically improved. We'll maintain his dexamethasone at 4 mg by mouth twice a day, and this can be tapered in the future by Dr. Isidore Moos. I cautioned him about driving. Plan: As above. Continue with radiation therapy as planned.

## 2014-02-26 NOTE — Progress Notes (Signed)
Edward [name] experienced dragging of feet, leg weakness and difficulty with words on Monday of this week.  Seen by Dr. Valere Dross and Decadron increased 4 mg po BID. No signs of thrush.  Reports today that he is ambulating better and able to speak.  He noticed on yesterday that he is "fumbling" when he picks up objects. And notes,  Lightheadedness on position change.  No headaches today.  VSS sitting vs. Standing.  Accompanied by his spouse.

## 2014-02-27 ENCOUNTER — Ambulatory Visit
Admission: RE | Admit: 2014-02-27 | Discharge: 2014-02-27 | Disposition: A | Discharge status: Home / Self Care | Payer: BC Managed Care – PPO | Source: Ambulatory Visit | Attending: Radiation Oncology | Admitting: Radiation Oncology

## 2014-02-27 ENCOUNTER — Other Ambulatory Visit: Payer: BC Managed Care – PPO

## 2014-02-27 DIAGNOSIS — Z510 Encounter for antineoplastic radiation therapy: Secondary | ICD-10-CM | POA: Diagnosis not present

## 2014-02-28 ENCOUNTER — Other Ambulatory Visit: Payer: Self-pay

## 2014-02-28 ENCOUNTER — Encounter: Payer: Self-pay | Admitting: *Deleted

## 2014-02-28 ENCOUNTER — Ambulatory Visit
Admission: RE | Admit: 2014-02-28 | Discharge: 2014-02-28 | Disposition: A | Discharge status: Home / Self Care | Payer: BC Managed Care – PPO | Source: Ambulatory Visit | Attending: Radiation Oncology | Admitting: Radiation Oncology

## 2014-02-28 ENCOUNTER — Other Ambulatory Visit (HOSPITAL_BASED_OUTPATIENT_CLINIC_OR_DEPARTMENT_OTHER): Payer: BC Managed Care – PPO

## 2014-02-28 DIAGNOSIS — Z510 Encounter for antineoplastic radiation therapy: Secondary | ICD-10-CM | POA: Diagnosis not present

## 2014-02-28 DIAGNOSIS — C711 Malignant neoplasm of frontal lobe: Secondary | ICD-10-CM

## 2014-02-28 LAB — CBC WITH DIFFERENTIAL/PLATELET
BASO%: 0.2 % (ref 0.0–2.0)
Basophils Absolute: 0 10*3/uL (ref 0.0–0.1)
EOS%: 0.5 % (ref 0.0–7.0)
Eosinophils Absolute: 0.1 10*3/uL (ref 0.0–0.5)
HCT: 38.8 % (ref 38.4–49.9)
HGB: 12.7 g/dL — ABNORMAL LOW (ref 13.0–17.1)
LYMPH#: 1.2 10*3/uL (ref 0.9–3.3)
LYMPH%: 8 % — ABNORMAL LOW (ref 14.0–49.0)
MCH: 29.1 pg (ref 27.2–33.4)
MCHC: 32.7 g/dL (ref 32.0–36.0)
MCV: 89 fL (ref 79.3–98.0)
MONO#: 1.1 10*3/uL — ABNORMAL HIGH (ref 0.1–0.9)
MONO%: 7.5 % (ref 0.0–14.0)
NEUT#: 12.1 10*3/uL — ABNORMAL HIGH (ref 1.5–6.5)
NEUT%: 83.8 % — ABNORMAL HIGH (ref 39.0–75.0)
Platelets: 250 10*3/uL (ref 140–400)
RBC: 4.36 10*6/uL (ref 4.20–5.82)
RDW: 15.1 % — AB (ref 11.0–14.6)
WBC: 14.5 10*3/uL — ABNORMAL HIGH (ref 4.0–10.3)

## 2014-02-28 LAB — COMPREHENSIVE METABOLIC PANEL (CC13)
ALBUMIN: 2.9 g/dL — AB (ref 3.5–5.0)
ALT: 118 U/L — AB (ref 0–55)
ANION GAP: 10 meq/L (ref 3–11)
AST: 45 U/L — AB (ref 5–34)
Alkaline Phosphatase: 106 U/L (ref 40–150)
BUN: 53.2 mg/dL — ABNORMAL HIGH (ref 7.0–26.0)
CALCIUM: 9.2 mg/dL (ref 8.4–10.4)
CHLORIDE: 106 meq/L (ref 98–109)
CO2: 22 meq/L (ref 22–29)
Creatinine: 2.5 mg/dL — ABNORMAL HIGH (ref 0.7–1.3)
Glucose: 103 mg/dl (ref 70–140)
POTASSIUM: 4.6 meq/L (ref 3.5–5.1)
SODIUM: 138 meq/L (ref 136–145)
TOTAL PROTEIN: 6.6 g/dL (ref 6.4–8.3)
Total Bilirubin: 0.35 mg/dL (ref 0.20–1.20)

## 2014-02-28 LAB — LACTATE DEHYDROGENASE (CC13): LDH: 337 U/L — ABNORMAL HIGH (ref 125–245)

## 2014-02-28 LAB — TECHNOLOGIST REVIEW

## 2014-02-28 NOTE — Progress Notes (Signed)
[place] Work  Clinical Social Work was referred by nurse for assessment of psychosocial needs due to possible transportation concerns.  Clinical Social Worker contacted patient at home via phone to offer support and assess for needs.  Pt reports transportation is not currently an issue and he plans to continue driving himself to treatment. CSW educated pt about the additional resources available to assist, and also inquired if his medical team had discussed the safety issues related to him driving. Pt stated he "had not had any seizures and if I can't drive, that's really bad". CSW again discussed options available if he changes his mind and to please consider. CSW educated pt on other resources available through the Patient and [facility]. Pt appreciated call, but not interested in additional assistance at this time.    Clinical Social Work interventions: Scientist, forensic education  Loren Racer, [place] Worker [place]  [place] Phone: [phone] Fax: [phone]

## 2014-03-03 ENCOUNTER — Ambulatory Visit
Admission: RE | Admit: 2014-03-03 | Discharge: 2014-03-03 | Disposition: A | Discharge status: Home / Self Care | Payer: BC Managed Care – PPO | Source: Ambulatory Visit | Attending: Radiation Oncology | Admitting: Radiation Oncology

## 2014-03-03 VITALS — BP 125/75 | HR 55 | Temp 98.2°F | Resp 14 | Wt 241.8 lb

## 2014-03-03 DIAGNOSIS — Z510 Encounter for antineoplastic radiation therapy: Secondary | ICD-10-CM | POA: Diagnosis not present

## 2014-03-03 DIAGNOSIS — C711 Malignant neoplasm of frontal lobe: Secondary | ICD-10-CM

## 2014-03-03 NOTE — Progress Notes (Signed)
Weekly Management Note:  Outpatient    ICD-9-CM ICD-10-CM  1. Glioblastoma 191.1 C71.1    Current Dose:  36 Gy  Projected Dose: 70 Gy   Narrative:  The patient presents for routine under treatment assessment.  CBCT/MVCT images/Port film x-rays were reviewed.  The chart was checked. Main concern - elevated Cr and BUN. Nephrologist holding Ramipril.  I reviewed his meds today. Is on 80mg  Lasix daily.  May have started this around the time his kidney labs began to climb up. Taking for LE edema. Decadron 4mg  BID controlling HAs and gait instability well. With wife today  Physical Findings:  weight is 241 lb 12.8 oz (109.68 kg). His oral temperature is 98.2 F (36.8 C). His blood pressure is 125/75 and his pulse is 55. His respiration is 14 and oxygen saturation is 98%.  NAD,  No thrush, + faint erythema scalp.  CBC    Component Value Date/Time   WBC 14.5* 02/28/2014 1048   WBC 14.2* 02/20/2014 0916   WBC 16.3* 01/06/2014 0430   RBC 4.36 02/28/2014 1048   RBC 4.30 02/20/2014 0916   RBC 3.70* 01/06/2014 0430   HGB 12.7* 02/28/2014 1048   HGB 13.0 02/20/2014 0916   HGB 11.3* 01/06/2014 0430   HCT 38.8 02/28/2014 1048   HCT 40.0 02/20/2014 0916   HCT 34.6* 01/06/2014 0430   PLT 250 02/28/2014 1048   PLT 243 02/20/2014 0916   PLT 250 01/06/2014 0430   MCV 89.0 02/28/2014 1048   MCV 93 02/20/2014 0916   MCV 93.5 01/06/2014 0430   MCH 29.1 02/28/2014 1048   MCH 30.2 02/20/2014 0916   MCH 30.5 01/06/2014 0430   MCHC 32.7 02/28/2014 1048   MCHC 32.5 02/20/2014 0916   MCHC 32.7 01/06/2014 0430   RDW 15.1* 02/28/2014 1048   RDW 15.1 02/20/2014 0916   RDW 13.4 01/06/2014 0430   LYMPHSABS 1.2 02/28/2014 1048   LYMPHSABS 1.2 02/20/2014 0916   LYMPHSABS 2.7 12/29/2013 2006   MONOABS 1.1* 02/28/2014 1048   MONOABS 1.3* 12/29/2013 2006   EOSABS 0.1 02/28/2014 1048   EOSABS 0.3 02/20/2014 0916   EOSABS 0.3 12/29/2013 2006   BASOSABS 0.0 02/28/2014 1048   BASOSABS 0.1 02/20/2014 0916   BASOSABS 0.1  12/29/2013 2006     CMP     Component Value Date/Time   NA 138 02/28/2014 1048   NA 146* 02/20/2014 0916   NA 140 01/28/2014 0801   K 4.6 02/28/2014 1048   K 4.5 02/20/2014 0916   K 4.7 01/28/2014 0801   CL 106 02/20/2014 0916   CL 106 01/28/2014 0801   CO2 22 02/28/2014 1048   CO2 27 02/20/2014 0916   CO2 28 01/28/2014 0801   GLUCOSE 103 02/28/2014 1048   GLUCOSE 78 02/20/2014 0916   GLUCOSE 73 01/28/2014 0801   BUN 53.2* 02/28/2014 1048   BUN 46* 02/20/2014 0916   BUN 31* 01/28/2014 0801   CREATININE 2.5* 02/28/2014 1048   CREATININE 2.6* 02/20/2014 0916   CREATININE 1.91* 01/28/2014 0801   CALCIUM 9.2 02/28/2014 1048   CALCIUM 9.6 02/20/2014 0916   CALCIUM 8.7 01/28/2014 0801   PROT 6.6 02/28/2014 1048   PROT 6.8 02/20/2014 0916   PROT 5.5* 01/28/2014 0801   ALBUMIN 2.9* 02/28/2014 1048   ALBUMIN 3.3* 01/28/2014 0801   AST 45* 02/28/2014 1048   AST 39* 02/20/2014 0916   AST 14 01/28/2014 0801   ALT 118* 02/28/2014 1048   ALT 65* 02/20/2014 4765  ALT 30 01/28/2014 0801   ALKPHOS 106 02/28/2014 1048   ALKPHOS 81 02/20/2014 0916   ALKPHOS 78 01/28/2014 0801   BILITOT 0.35 02/28/2014 1048   BILITOT 0.70 02/20/2014 0916   BILITOT 0.4 01/28/2014 0801   GFRNONAA 50* 01/06/2014 0430   GFRAA 57* 01/06/2014 0430     Impression:  The patient is tolerating radiotherapy.   Plan:  Continue radiotherapy as planned. Encouraged pt to call nephrologist to discuss possible implications of Lasix on kidney function.  -----------------------------------  Eppie Gibson, MD

## 2014-03-03 NOTE — Progress Notes (Signed)
He rates his pain as a 3 on a scale of 0-10. Pt complains of, Fatigue and Generalized Weakness.  Pt alert & oriented x 3 with fluent speech, wife reports his gait was unsteady shaky and sweating, reports it was after eating cereal and drinking orange juice.  Strength normal in UE and normal in LE. Pt reports having "weaker" vision and has ordered new glasses, PERRLA. Pt presenting appropriate quality, quantity and organization of sentences. Pt reports dull pain headache over the top of his head, which started approx at 9 am.  He hasn't taken any medication to alleviate it.  Noted erythema - scalp, reports he will start using Biafine. The patient eats a regular, healthy diet..  Decadron? Yes.  4mg /bid Reports he was taken off Ramipril (Altace) 5mg  on Oct 15th due to his Creatinine levels were high.

## 2014-03-04 ENCOUNTER — Other Ambulatory Visit: Payer: Self-pay | Admitting: *Deleted

## 2014-03-04 ENCOUNTER — Ambulatory Visit
Admission: RE | Admit: 2014-03-04 | Discharge: 2014-03-04 | Disposition: A | Discharge status: Home / Self Care | Payer: BC Managed Care – PPO | Source: Ambulatory Visit | Attending: Radiation Oncology | Admitting: Radiation Oncology

## 2014-03-04 DIAGNOSIS — Z510 Encounter for antineoplastic radiation therapy: Secondary | ICD-10-CM | POA: Diagnosis not present

## 2014-03-04 DIAGNOSIS — C711 Malignant neoplasm of frontal lobe: Secondary | ICD-10-CM

## 2014-03-04 MED ORDER — TEMOZOLOMIDE 140 MG PO CAPS: 140.0000 mg | ORAL_CAPSULE | Freq: Every day | ORAL | Status: DC

## 2014-03-05 ENCOUNTER — Ambulatory Visit
Admission: RE | Admit: 2014-03-05 | Discharge: 2014-03-05 | Disposition: A | Discharge status: Home / Self Care | Payer: BC Managed Care – PPO | Source: Ambulatory Visit | Attending: Radiation Oncology | Admitting: Radiation Oncology

## 2014-03-05 DIAGNOSIS — Z510 Encounter for antineoplastic radiation therapy: Secondary | ICD-10-CM | POA: Diagnosis not present

## 2014-03-06 ENCOUNTER — Ambulatory Visit
Admission: RE | Admit: 2014-03-06 | Discharge: 2014-03-06 | Disposition: A | Discharge status: Home / Self Care | Payer: BC Managed Care – PPO | Source: Ambulatory Visit | Attending: Radiation Oncology | Admitting: Radiation Oncology

## 2014-03-06 ENCOUNTER — Encounter: Payer: Self-pay | Admitting: Radiation Oncology

## 2014-03-06 ENCOUNTER — Ambulatory Visit: Payer: BC Managed Care – PPO

## 2014-03-06 VITALS — BP 124/74 | HR 58 | Wt 239.9 lb

## 2014-03-06 DIAGNOSIS — C711 Malignant neoplasm of frontal lobe: Secondary | ICD-10-CM

## 2014-03-06 DIAGNOSIS — Z510 Encounter for antineoplastic radiation therapy: Secondary | ICD-10-CM | POA: Diagnosis not present

## 2014-03-06 NOTE — Progress Notes (Signed)
Pt here due to falling at approximately 8:30 am this morning in the grass at his home while retrieving the mail.  He reports he felt "light headed." He reports he is not taking Lasix 80 mg since 03/04/14 and Wellbutrin 150 mg since 03/02/14.  Orthostatic VS taken.  Reports his injuries from falling are a scraped Right knee, forehead, nose and noted a small laceration under right nostril.  Laceration cleansed with NS to reveal a small amount of bright red drainage.  PERRLA.  Denies visual or auditory abnormalities.

## 2014-03-06 NOTE — Progress Notes (Signed)
  Radiation Oncology         [phone]) [phone] ________________________________  Name: Edward [name] MRN: [id number]  Date: 03/06/2014  DOB: [birth year]  Weekly Radiation Therapy Management  Principle Diagnosis:  Right frontal glioblastoma multiforme  History of localized renal cell carcinoma of the left kidney   Current Therapy:  Radiation therapy with low-dose Temodar   Current Dose: 42 Gy     Planned Dose:  60 Gy  Narrative . . . . . . . Marland Kitchen the patient is seen today after he fell accidentally while going out to get his mail. The patient fell in the grass. He felt lightheaded but did not pass out. He did not pass out after he fell. He denies any nausea or headaches this time.                                                                   Set-up films were reviewed.                                 The chart was checked. Physical Findings. . .  weight is 239 lb 14.4 oz (108.818 kg). His blood pressure is 124/74 and his pulse is 58. His oxygen saturation is 98%. . The tympanic membranes are benign without any blood in the external canal. Patient is alert and oriented and responds appropriately to questions. 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  the oral cavity is moist without secondary infection. On neurological examination motor strength is 5 out of 5 in proximal and distal muscle groups in the upper and lower extremities. The patient has some very superficial lacerations along the right knee. He also has a small superficial laceration along the face just superior to his upper lip. These areas were cleansed without any significant bleeding noted. Impression . . . . . . . The patient is tolerating radiation. No significant trauma from his fall earlier today. Plan . . . . . . . . . . . . Continue treatment as planned. The patient was advised not to ride his scooter today and call if he develops symptoms later in the  day.  ________________________________   Blair Promise, PhD, MD

## 2014-03-07 ENCOUNTER — Ambulatory Visit
Admission: RE | Admit: 2014-03-07 | Discharge: 2014-03-07 | Disposition: A | Discharge status: Home / Self Care | Payer: BC Managed Care – PPO | Source: Ambulatory Visit | Attending: Radiation Oncology | Admitting: Radiation Oncology

## 2014-03-07 ENCOUNTER — Encounter: Payer: Self-pay | Admitting: Radiation Oncology

## 2014-03-07 ENCOUNTER — Ambulatory Visit: Payer: BC Managed Care – PPO

## 2014-03-07 DIAGNOSIS — Z510 Encounter for antineoplastic radiation therapy: Secondary | ICD-10-CM | POA: Diagnosis not present

## 2014-03-10 ENCOUNTER — Ambulatory Visit
Admission: RE | Admit: 2014-03-10 | Discharge: 2014-03-10 | Disposition: A | Discharge status: Home / Self Care | Payer: BC Managed Care – PPO | Source: Ambulatory Visit | Attending: Radiation Oncology | Admitting: Radiation Oncology

## 2014-03-10 ENCOUNTER — Encounter: Payer: Self-pay | Admitting: Radiation Oncology

## 2014-03-10 VITALS — BP 125/68 | HR 54 | Temp 98.0°F | Resp 16 | Wt 244.2 lb

## 2014-03-10 DIAGNOSIS — C711 Malignant neoplasm of frontal lobe: Secondary | ICD-10-CM

## 2014-03-10 DIAGNOSIS — Z510 Encounter for antineoplastic radiation therapy: Secondary | ICD-10-CM | POA: Diagnosis not present

## 2014-03-10 MED ORDER — DEXAMETHASONE 4 MG PO TABS: 8.0000 mg | ORAL_TABLET | Freq: Two times a day (BID) | ORAL | Status: DC

## 2014-03-10 NOTE — Progress Notes (Signed)
He is currently in no pain. Pt complains of periods "light headed" which caused him to fall on 03/06/14, and received injury of scrapes and a laceration under his nose.  He fell again this morning at approximately 10 am in the hospital parking lot.  He said he felt "light headed" and lost his balance.  He didn't hit his head but did receive a scraped left knee.  Open wound distal to left knee is approximately quarter sized with a small amount of bright red drainage.  Pt alert & oriented x 3 with fluent speech, upper and lower extremities equally strong, noted mild edema over bilateral lower extremities. Pt PERRLA, no auditory or visual abnormalities reported. Pt presenting appropriate quality, quantity and organization of sentences. Pt reports dull pain over the top of his head occasionally. Reports occasional urinary incontinence and retention. Reports having a muscle pull over the right side of his back Noted skin over treatment field warm dry and intact.   Decadron? YES 4mg  bid

## 2014-03-10 NOTE — Progress Notes (Signed)
Weekly Management Note:  Outpatient    ICD-9-CM ICD-10-CM   1. Glioblastoma 191.1 C71.1 dexamethasone (DECADRON) 4 MG tablet    Current Dose:  46 Gy  Projected Dose: 60 Gy   Narrative:  The patient presents for routine under treatment assessment.  CBCT/MVCT images/Port film x-rays were reviewed.  The chart was checked. [place] once last week and once this AM in the parking lot.  Cannot say why/how he fell, but reports sudden weakness on both occasions.  No new HAs, nausea, seizures, or loss of consciousness.  No trauma to head.  Has abrasion on left shin from this AM's fall.  Also - reports that his nephologist is now holding his LASIX.  Has gained ~ 5lbs, feels bloating.  Has some urinary incontinence and sensation of retention.   Physical Findings:  weight is 244 lb 3.2 oz (110.768 kg). His oral temperature is 98 F (36.7 C). His blood pressure is 125/68 and his pulse is 54. His respiration is 16 and oxygen saturation is 99%.  NAD, in [place].  No oral thrush.  Subtle weakness, in left arm. Stable bell's palsy. Otherwise neuro exam non focal.  LE edema.  CBC    Component Value Date/Time   WBC 14.5* 02/28/2014 1048   WBC 14.2* 02/20/2014 0916   WBC 16.3* 01/06/2014 0430   RBC 4.36 02/28/2014 1048   RBC 4.30 02/20/2014 0916   RBC 3.70* 01/06/2014 0430   HGB 12.7* 02/28/2014 1048   HGB 13.0 02/20/2014 0916   HGB 11.3* 01/06/2014 0430   HCT 38.8 02/28/2014 1048   HCT 40.0 02/20/2014 0916   HCT 34.6* 01/06/2014 0430   PLT 250 02/28/2014 1048   PLT 243 02/20/2014 0916   PLT 250 01/06/2014 0430   MCV 89.0 02/28/2014 1048   MCV 93 02/20/2014 0916   MCV 93.5 01/06/2014 0430   MCH 29.1 02/28/2014 1048   MCH 30.2 02/20/2014 0916   MCH 30.5 01/06/2014 0430   MCHC 32.7 02/28/2014 1048   MCHC 32.5 02/20/2014 0916   MCHC 32.7 01/06/2014 0430   RDW 15.1* 02/28/2014 1048   RDW 15.1 02/20/2014 0916   RDW 13.4 01/06/2014 0430   LYMPHSABS 1.2 02/28/2014 1048   LYMPHSABS 1.2 02/20/2014 0916   LYMPHSABS 2.7  12/29/2013 2006   MONOABS 1.1* 02/28/2014 1048   MONOABS 1.3* 12/29/2013 2006   EOSABS 0.1 02/28/2014 1048   EOSABS 0.3 02/20/2014 0916   EOSABS 0.3 12/29/2013 2006   BASOSABS 0.0 02/28/2014 1048   BASOSABS 0.1 02/20/2014 0916   BASOSABS 0.1 12/29/2013 2006     CMP     Component Value Date/Time   NA 138 02/28/2014 1048   NA 146* 02/20/2014 0916   NA 140 01/28/2014 0801   K 4.6 02/28/2014 1048   K 4.5 02/20/2014 0916   K 4.7 01/28/2014 0801   CL 106 02/20/2014 0916   CL 106 01/28/2014 0801   CO2 22 02/28/2014 1048   CO2 27 02/20/2014 0916   CO2 28 01/28/2014 0801   GLUCOSE 103 02/28/2014 1048   GLUCOSE 78 02/20/2014 0916   GLUCOSE 73 01/28/2014 0801   BUN 53.2* 02/28/2014 1048   BUN 46* 02/20/2014 0916   BUN 31* 01/28/2014 0801   CREATININE 2.5* 02/28/2014 1048   CREATININE 2.6* 02/20/2014 0916   CREATININE 1.91* 01/28/2014 0801   CALCIUM 9.2 02/28/2014 1048   CALCIUM 9.6 02/20/2014 0916   CALCIUM 8.7 01/28/2014 0801   PROT 6.6 02/28/2014 1048   PROT 6.8 02/20/2014  0916   PROT 5.5* 01/28/2014 0801   ALBUMIN 2.9* 02/28/2014 1048   ALBUMIN 3.3* 01/28/2014 0801   AST 45* 02/28/2014 1048   AST 39* 02/20/2014 0916   AST 14 01/28/2014 0801   ALT 118* 02/28/2014 1048   ALT 65* 02/20/2014 0916   ALT 30 01/28/2014 0801   ALKPHOS 106 02/28/2014 1048   ALKPHOS 81 02/20/2014 0916   ALKPHOS 78 01/28/2014 0801   BILITOT 0.35 02/28/2014 1048   BILITOT 0.70 02/20/2014 0916   BILITOT 0.4 01/28/2014 0801   GFRNONAA 50* 01/06/2014 0430   GFRAA 57* 01/06/2014 0430     Impression:  The patient is tolerating radiotherapy.   Plan:  Continue radiotherapy as planned. Due to recent falls, will increase Dexamethasone to 8mg  BID. He is on Omeprazole for GI prophylaxis.  His friend is driving him home.   I will call his nephrologist to discuss his issues (Lasix / kidney issues / swelling... swelling could be worse on Decadron).  Do not think hydrocephalus is likely given tumor location. No MRIs ordered for now.  Pt  declines PT/OT for now.  -----------------------------------  Eppie Gibson, MD

## 2014-03-11 ENCOUNTER — Ambulatory Visit
Admission: RE | Admit: 2014-03-11 | Discharge: 2014-03-11 | Disposition: A | Discharge status: Home / Self Care | Payer: BC Managed Care – PPO | Source: Ambulatory Visit | Attending: Radiation Oncology | Admitting: Radiation Oncology

## 2014-03-11 DIAGNOSIS — Z510 Encounter for antineoplastic radiation therapy: Secondary | ICD-10-CM | POA: Diagnosis not present

## 2014-03-12 ENCOUNTER — Ambulatory Visit
Admission: RE | Admit: 2014-03-12 | Discharge: 2014-03-12 | Disposition: A | Discharge status: Home / Self Care | Payer: BC Managed Care – PPO | Source: Ambulatory Visit | Attending: Radiation Oncology | Admitting: Radiation Oncology

## 2014-03-12 DIAGNOSIS — Z510 Encounter for antineoplastic radiation therapy: Secondary | ICD-10-CM | POA: Diagnosis not present

## 2014-03-13 ENCOUNTER — Ambulatory Visit
Admission: RE | Admit: 2014-03-13 | Discharge: 2014-03-13 | Disposition: A | Discharge status: Home / Self Care | Payer: BC Managed Care – PPO | Source: Ambulatory Visit | Attending: Radiation Oncology | Admitting: Radiation Oncology

## 2014-03-13 DIAGNOSIS — Z510 Encounter for antineoplastic radiation therapy: Secondary | ICD-10-CM | POA: Diagnosis not present

## 2014-03-14 ENCOUNTER — Ambulatory Visit
Admission: RE | Admit: 2014-03-14 | Discharge: 2014-03-14 | Disposition: A | Discharge status: Home / Self Care | Payer: BC Managed Care – PPO | Source: Ambulatory Visit | Attending: Radiation Oncology | Admitting: Radiation Oncology

## 2014-03-14 ENCOUNTER — Encounter: Payer: Self-pay | Admitting: Hematology & Oncology

## 2014-03-14 DIAGNOSIS — Z510 Encounter for antineoplastic radiation therapy: Secondary | ICD-10-CM | POA: Diagnosis not present

## 2014-03-16 ENCOUNTER — Encounter: Payer: Self-pay | Admitting: Hematology & Oncology

## 2014-03-17 ENCOUNTER — Ambulatory Visit
Admission: RE | Admit: 2014-03-17 | Discharge: 2014-03-17 | Disposition: A | Discharge status: Home / Self Care | Payer: BC Managed Care – PPO | Source: Ambulatory Visit | Attending: Radiation Oncology | Admitting: Radiation Oncology

## 2014-03-17 ENCOUNTER — Inpatient Hospital Stay
Admission: RE | Admit: 2014-03-17 | Discharge: 2014-03-17 | Disposition: A | Discharge status: Home / Self Care | Payer: BC Managed Care – PPO | Source: Ambulatory Visit | Attending: Radiation Oncology | Admitting: Radiation Oncology

## 2014-03-17 ENCOUNTER — Encounter: Payer: Self-pay | Admitting: *Deleted

## 2014-03-17 ENCOUNTER — Encounter: Payer: Self-pay | Admitting: Radiation Oncology

## 2014-03-17 ENCOUNTER — Telehealth: Payer: Self-pay | Admitting: *Deleted

## 2014-03-17 VITALS — BP 135/75 | HR 64 | Temp 98.2°F | Ht 70.0 in | Wt 242.5 lb

## 2014-03-17 DIAGNOSIS — Z510 Encounter for antineoplastic radiation therapy: Secondary | ICD-10-CM | POA: Diagnosis not present

## 2014-03-17 DIAGNOSIS — B370 Candidal stomatitis: Secondary | ICD-10-CM

## 2014-03-17 DIAGNOSIS — C711 Malignant neoplasm of frontal lobe: Secondary | ICD-10-CM

## 2014-03-17 MED ORDER — NYSTATIN 100000 UNIT/ML MT SUSP: 5.0000 mL | Freq: Four times a day (QID) | OROMUCOSAL | Status: DC

## 2014-03-17 NOTE — Progress Notes (Signed)
Weekly Management Note:  Outpatient  DX: GLIOBLASTOMA     ICD-9-CM ICD-10-CM   1. Glioblastoma 191.1 C71.1 nystatin (MYCOSTATIN) 100000 UNIT/ML suspension  2. Thrush 112.0 B37.0 nystatin (MYCOSTATIN) 100000 UNIT/ML suspension    Current Dose:  56 Gy  Projected Dose: 60 Gy   Narrative:  The patient presents for routine under treatment assessment.  CBCT/MVCT images/Port film x-rays were reviewed.  The chart was checked. Fell once last week on carpet at stairs. "I just fell." No injury reported.  No HA or nausea or new neurologic sx. Decadron 8mg  BID. Lasix 40mg . Creatinine trending down at nephrologists'.   Feels least steady in the mornings.  Physical Findings:  height is 5\' 10"  (1.778 m) and weight is 242 lb 8 oz (109.997 kg). His temperature is 98.2 F (36.8 C). His blood pressure is 135/75 and his pulse is 64.  scant oral thrush. Ankle edema, pitting.  CBC    Component Value Date/Time   WBC 14.5* 02/28/2014 1048   WBC 14.2* 02/20/2014 0916   WBC 16.3* 01/06/2014 0430   RBC 4.36 02/28/2014 1048   RBC 4.30 02/20/2014 0916   RBC 3.70* 01/06/2014 0430   HGB 12.7* 02/28/2014 1048   HGB 13.0 02/20/2014 0916   HGB 11.3* 01/06/2014 0430   HCT 38.8 02/28/2014 1048   HCT 40.0 02/20/2014 0916   HCT 34.6* 01/06/2014 0430   PLT 250 02/28/2014 1048   PLT 243 02/20/2014 0916   PLT 250 01/06/2014 0430   MCV 89.0 02/28/2014 1048   MCV 93 02/20/2014 0916   MCV 93.5 01/06/2014 0430   MCH 29.1 02/28/2014 1048   MCH 30.2 02/20/2014 0916   MCH 30.5 01/06/2014 0430   MCHC 32.7 02/28/2014 1048   MCHC 32.5 02/20/2014 0916   MCHC 32.7 01/06/2014 0430   RDW 15.1* 02/28/2014 1048   RDW 15.1 02/20/2014 0916   RDW 13.4 01/06/2014 0430   LYMPHSABS 1.2 02/28/2014 1048   LYMPHSABS 1.2 02/20/2014 0916   LYMPHSABS 2.7 12/29/2013 2006   MONOABS 1.1* 02/28/2014 1048   MONOABS 1.3* 12/29/2013 2006   EOSABS 0.1 02/28/2014 1048   EOSABS 0.3 02/20/2014 0916   EOSABS 0.3 12/29/2013 2006   BASOSABS 0.0 02/28/2014 1048   BASOSABS 0.1 02/20/2014 0916   BASOSABS 0.1 12/29/2013 2006     CMP     Component Value Date/Time   NA 138 02/28/2014 1048   NA 146* 02/20/2014 0916   NA 140 01/28/2014 0801   K 4.6 02/28/2014 1048   K 4.5 02/20/2014 0916   K 4.7 01/28/2014 0801   CL 106 02/20/2014 0916   CL 106 01/28/2014 0801   CO2 22 02/28/2014 1048   CO2 27 02/20/2014 0916   CO2 28 01/28/2014 0801   GLUCOSE 103 02/28/2014 1048   GLUCOSE 78 02/20/2014 0916   GLUCOSE 73 01/28/2014 0801   BUN 53.2* 02/28/2014 1048   BUN 46* 02/20/2014 0916   BUN 31* 01/28/2014 0801   CREATININE 2.5* 02/28/2014 1048   CREATININE 2.6* 02/20/2014 0916   CREATININE 1.91* 01/28/2014 0801   CALCIUM 9.2 02/28/2014 1048   CALCIUM 9.6 02/20/2014 0916   CALCIUM 8.7 01/28/2014 0801   PROT 6.6 02/28/2014 1048   PROT 6.8 02/20/2014 0916   PROT 5.5* 01/28/2014 0801   ALBUMIN 2.9* 02/28/2014 1048   ALBUMIN 3.3* 01/28/2014 0801   AST 45* 02/28/2014 1048   AST 39* 02/20/2014 0916   AST 14 01/28/2014 0801   ALT 118* 02/28/2014 1048   ALT  65* 02/20/2014 0916   ALT 30 01/28/2014 0801   ALKPHOS 106 02/28/2014 1048   ALKPHOS 81 02/20/2014 0916   ALKPHOS 78 01/28/2014 0801   BILITOT 0.35 02/28/2014 1048   BILITOT 0.70 02/20/2014 0916   BILITOT 0.4 01/28/2014 0801   GFRNONAA 50* 01/06/2014 0430   GFRAA 57* 01/06/2014 0430     Impression:  The patient is tolerating radiotherapy.   Plan:  Continue radiotherapy as planned. Nystatin for thrush.  MRI at [facility] in a couple weeks. I asked him to have [facility] fed ex his CDs to [place]. Taper Decadron to 8mg  in AM, 4 mg in PM on 11/16.  F/u in 30mo, sooner if needed. Declines PT OT. Asking staff to scan labs from nephrology.  -----------------------------------  Eppie Gibson, MD

## 2014-03-17 NOTE — Telephone Encounter (Signed)
Patient had questions regarding appointments. Will reschedule appointments for after his tests at [facility] so Dr Marin Olp can review results. Message sent to scheduler.

## 2014-03-17 NOTE — Progress Notes (Signed)
Received notification from [facility] that Temozolomide was dispensed to the patient on 03/06/14

## 2014-03-18 ENCOUNTER — Telehealth: Payer: Self-pay | Admitting: Hematology & Oncology

## 2014-03-18 ENCOUNTER — Ambulatory Visit: Admission: RE | Admit: 2014-03-18 | Payer: BC Managed Care – PPO | Source: Ambulatory Visit

## 2014-03-18 ENCOUNTER — Encounter: Payer: Self-pay | Admitting: *Deleted

## 2014-03-18 NOTE — Telephone Encounter (Signed)
Per orders to cx 03/28/14 apt and resch after 04/02/14.  Apt was sch for 11/201/15.  i called patient and gave apt date/time.  Patient is aware of apt and calendar will be mailed out

## 2014-03-18 NOTE — Progress Notes (Unsigned)
Received fax from [facility] stating that pt is enrolled with this pharmacy and Temozolomide was dispensed on 03/06/14.

## 2014-03-19 ENCOUNTER — Ambulatory Visit
Admission: RE | Admit: 2014-03-19 | Discharge: 2014-03-19 | Disposition: A | Discharge status: Home / Self Care | Payer: BC Managed Care – PPO | Source: Ambulatory Visit | Attending: Radiation Oncology | Admitting: Radiation Oncology

## 2014-03-19 ENCOUNTER — Ambulatory Visit: Payer: BC Managed Care – PPO

## 2014-03-19 DIAGNOSIS — Z510 Encounter for antineoplastic radiation therapy: Secondary | ICD-10-CM | POA: Diagnosis not present

## 2014-03-20 ENCOUNTER — Encounter: Payer: Self-pay | Admitting: Radiation Oncology

## 2014-03-20 ENCOUNTER — Ambulatory Visit
Admission: RE | Admit: 2014-03-20 | Discharge: 2014-03-20 | Disposition: A | Discharge status: Home / Self Care | Payer: BC Managed Care – PPO | Source: Ambulatory Visit | Attending: Radiation Oncology | Admitting: Radiation Oncology

## 2014-03-20 ENCOUNTER — Ambulatory Visit: Payer: BC Managed Care – PPO

## 2014-03-20 ENCOUNTER — Encounter: Payer: Self-pay | Admitting: Hematology & Oncology

## 2014-03-20 DIAGNOSIS — Z510 Encounter for antineoplastic radiation therapy: Secondary | ICD-10-CM | POA: Diagnosis not present

## 2014-03-21 NOTE — Progress Notes (Addendum)
  Radiation Oncology         [phone]) [phone] ________________________________  Name: Edward [name] MRN: [id number]  Date: 03/20/2014  DOB: December 08, 1945  End of Treatment Note  Diagnosis:   GLIOBLASTOMA of RIGHT FRONTAL LOBE    Indication for treatment:  curative       Radiation treatment dates:  02/06/2014-03/20/2014  Site/dose:    1) Right frontal lobe tumor bed initial / 46 Gy in 23 fractions 2) Right frontal lobe tumor bed boost / 14 Gy in 7 fractions  Beams/energy:    1) IMRT / VMAT // 6MV 2) IMRT / VMAT // 6MV  Narrative: The patient tolerated radiation treatment with temozolamide relatively well.  His dexamethasone was increased to 8mg  BID due to falls and headaches and unsteadiness. This improved his symptoms.  Plan: The patient has completed radiation treatment. The patient will return to radiation oncology clinic for routine followup in one month. I advised them to call or return sooner if they have any questions or concerns related to their recovery or treatment.  Nystatin Rx'd for thrush. MRI of brain scheduled at [facility] in a couple of weeks. I asked him to have [facility] fed ex his CDs to [place]. Taper Decadron to 8mg  in AM, 4 mg in PM on 11/16.   For now, in spite of falls, he declines PT OT.  -----------------------------------  Eppie Gibson, MD

## 2014-03-24 ENCOUNTER — Telehealth: Payer: Self-pay | Admitting: Neurology

## 2014-03-24 NOTE — Telephone Encounter (Signed)
Patient has scheduled and confirmed appt

## 2014-03-24 NOTE — Telephone Encounter (Signed)
Called patient to schedule f/u  Appt. , said that he had brain surgery in Aug. 18 Should he still be seen so soon? May take a look at his record in [facility] and let him know.

## 2014-03-24 NOTE — Telephone Encounter (Signed)
From the sleep apnea standpoint he was stable and was scheduled for a yearly follow-up with me which is scheduled for June next year. However, for dizziness, I can see him any time. Please schedule according to patient availability. Can use a new patient slot or 30 minute follow-up slot.

## 2014-03-26 ENCOUNTER — Ambulatory Visit: Payer: BC Managed Care – PPO | Admitting: Hematology & Oncology

## 2014-03-26 ENCOUNTER — Other Ambulatory Visit: Payer: BC Managed Care – PPO | Admitting: Lab

## 2014-03-28 ENCOUNTER — Other Ambulatory Visit: Payer: BC Managed Care – PPO | Admitting: Lab

## 2014-03-28 ENCOUNTER — Ambulatory Visit: Payer: BC Managed Care – PPO | Admitting: Hematology & Oncology

## 2014-04-01 ENCOUNTER — Encounter: Payer: Self-pay | Admitting: Neurology

## 2014-04-02 ENCOUNTER — Telehealth: Payer: Self-pay | Admitting: Neurology

## 2014-04-02 ENCOUNTER — Ambulatory Visit: Payer: Self-pay | Admitting: Neurology

## 2014-04-02 NOTE — Telephone Encounter (Signed)
Patient left message with answering service on 04/01/14 to cancel today's appointment @10 :30(per [name]).

## 2014-04-02 NOTE — Telephone Encounter (Signed)
I called and talked to the patient's wife first and then the patient himself regarding today's canceled appointment. I wanted to make sure that he did not accidentally canceled through the automated response service. He said he actually talked to the answering service last night and canceled because he had an appointment yesterday at [facility] with his oncologist and she noted irregular heartbeat. She wants him to get checked out by his primary care physician and get an EKG. This is why he had to cancel the appointment today as he will be seeing his primary care physician at 11:30. I reminded the patient to stay well-hydrated. He denies any palpitations or chest pain or low blood pressure. He admits that he may not be drinking enough water as his wife reminds him that he does not drink enough. I also reminded him to try to eat nutritious food. I asked him to reschedule his appointment at his next convenient opportunity and we will certainly make room for him to be seen as soon as possible. He was in agreement.

## 2014-04-04 ENCOUNTER — Encounter: Payer: Self-pay | Admitting: Neurology

## 2014-04-04 ENCOUNTER — Encounter: Payer: Self-pay | Admitting: Hematology & Oncology

## 2014-04-04 ENCOUNTER — Other Ambulatory Visit (HOSPITAL_BASED_OUTPATIENT_CLINIC_OR_DEPARTMENT_OTHER): Payer: BC Managed Care – PPO | Admitting: Lab

## 2014-04-04 ENCOUNTER — Ambulatory Visit (HOSPITAL_BASED_OUTPATIENT_CLINIC_OR_DEPARTMENT_OTHER): Payer: BC Managed Care – PPO | Admitting: Hematology & Oncology

## 2014-04-04 VITALS — BP 115/73 | HR 64 | Temp 98.2°F | Resp 16 | Ht 73.0 in | Wt 236.0 lb

## 2014-04-04 DIAGNOSIS — E242 Drug-induced Cushing's syndrome: Secondary | ICD-10-CM

## 2014-04-04 DIAGNOSIS — Z7952 Long term (current) use of systemic steroids: Secondary | ICD-10-CM

## 2014-04-04 DIAGNOSIS — M7989 Other specified soft tissue disorders: Secondary | ICD-10-CM

## 2014-04-04 DIAGNOSIS — C711 Malignant neoplasm of frontal lobe: Secondary | ICD-10-CM

## 2014-04-04 LAB — CBC WITH DIFFERENTIAL (CANCER CENTER ONLY)
BASO#: 0 10*3/uL (ref 0.0–0.2)
BASO%: 0.2 % (ref 0.0–2.0)
EOS ABS: 0.1 10*3/uL (ref 0.0–0.5)
EOS%: 0.3 % (ref 0.0–7.0)
HCT: 40.7 % (ref 38.7–49.9)
HGB: 13.4 g/dL (ref 13.0–17.1)
LYMPH#: 1.4 10*3/uL (ref 0.9–3.3)
LYMPH%: 8 % — AB (ref 14.0–48.0)
MCH: 30.1 pg (ref 28.0–33.4)
MCHC: 32.9 g/dL (ref 32.0–35.9)
MCV: 92 fL (ref 82–98)
MONO#: 1.1 10*3/uL — AB (ref 0.1–0.9)
MONO%: 6.3 % (ref 0.0–13.0)
NEUT%: 85.2 % — ABNORMAL HIGH (ref 40.0–80.0)
NEUTROS ABS: 14.8 10*3/uL — AB (ref 1.5–6.5)
PLATELETS: 155 10*3/uL (ref 145–400)
RBC: 4.45 10*6/uL (ref 4.20–5.70)
RDW: 17 % — ABNORMAL HIGH (ref 11.1–15.7)
WBC: 17.4 10*3/uL — ABNORMAL HIGH (ref 4.0–10.0)

## 2014-04-04 LAB — CMP (CANCER CENTER ONLY)
ALBUMIN: 2.9 g/dL — AB (ref 3.3–5.5)
ALT: 70 U/L — AB (ref 10–47)
AST: 34 U/L (ref 11–38)
Alkaline Phosphatase: 85 U/L — ABNORMAL HIGH (ref 26–84)
BUN, Bld: 33 mg/dL — ABNORMAL HIGH (ref 7–22)
CHLORIDE: 97 meq/L — AB (ref 98–108)
CO2: 29 mEq/L (ref 18–33)
Calcium: 8.5 mg/dL (ref 8.0–10.3)
Creat: 1.8 mg/dl — ABNORMAL HIGH (ref 0.6–1.2)
Glucose, Bld: 71 mg/dL — ABNORMAL LOW (ref 73–118)
Potassium: 3.5 mEq/L (ref 3.3–4.7)
SODIUM: 141 meq/L (ref 128–145)
Total Bilirubin: 1 mg/dl (ref 0.20–1.60)
Total Protein: 6.3 g/dL — ABNORMAL LOW (ref 6.4–8.1)

## 2014-04-04 LAB — LACTATE DEHYDROGENASE: LDH: 440 U/L — ABNORMAL HIGH (ref 94–250)

## 2014-04-04 LAB — TECHNOLOGIST REVIEW CHCC SATELLITE

## 2014-04-04 MED ORDER — SULFAMETHOXAZOLE-TRIMETHOPRIM 800-160 MG PO TABS: ORAL_TABLET | ORAL | Status: DC

## 2014-04-04 MED ORDER — TEMOZOLOMIDE 180 MG PO CAPS: ORAL_CAPSULE | ORAL | Status: DC

## 2014-04-04 MED ORDER — TEMOZOLOMIDE 250 MG PO CAPS: ORAL_CAPSULE | ORAL | Status: DC

## 2014-04-04 NOTE — Progress Notes (Signed)
Hematology and Oncology Follow Up Visit  Edward [name] [id number] 06/07/1945 68 y.o. 04/04/2014   Principle Diagnosis:   Right frontal glioblastoma multiforme  History of localized renal cell carcinoma of the left kidney  Current Therapy:    Patient has completed radiation therapy and concurrent low-dose Temodar. He completed this on November 11     Interim History:  Mr.  Molstad is back for follow-up. Unfortunately, he is in a wheelchair. 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 He is on Decadron on a taper. I will increase the speed of this taper. I told him how to taper the Decadron and wrote this down for him on his calendar. He will need physical therapy. We will arrange for this.  At [facility], he had an MRI done. This did not show any evidence of tumor progression. There is interval decrease in size and mass effect with the right frontal resection cavity. There is decrease in contrast enhancement. There is no evidence of obvious metastatic disease in the brain.  I spent about an hour with he and his wife. It is apparent that no one has tried to deal with this issue of him not being able to walk. Again, I think that this is from steroid use.  I'm trying to cut back on his medications. I will keep him off his Keppra. He ran out of this 2 or 3 days ago. He's never had a seizure.  His appetite is doing okay. I think he eats a lot of sugar and candy. I told him that he really needs to focus on protein. His albumin is only 2.9. I think the swelling in his legs probably is from low albumin and also from steroids. I did give him a prescription for compression stockings.  I told them that we need to the full dose Temodar. The doses 200 mg/m per day for 5 days only. His dose would be about 430 mg a day.       Medications: Current outpatient prescriptions: allopurinol (ZYLOPRIM) 100 MG tablet, Take 100 mg by mouth daily. , Disp: , Rfl: ;  buPROPion  (WELLBUTRIN XL) 300 MG 24 hr tablet, Take 300 mg by mouth daily., Disp: , Rfl: ;  citalopram (CELEXA) 40 MG tablet, Take 1 tablet (40 mg total) by mouth daily., Disp: 30 tablet, Rfl: 2;  clonazePAM (KLONOPIN) 0.5 MG tablet, Take 0.5 mg by mouth at bedtime. , Disp: , Rfl:  dexamethasone (DECADRON) 4 MG tablet, Take 2 tablets (8 mg total) by mouth 2 (two) times daily. Take with food., Disp: 120 tablet, Rfl: 0;  emollient (BIAFINE) cream, Apply 1 application topically daily. Apply to frontal part of head daily aftr rad txs, skin irritation/itching, Disp: , Rfl: ;  furosemide (LASIX) 80 MG tablet, Take 40 mg by mouth daily. , Disp: , Rfl:  HYDROcodone-acetaminophen (NORCO/VICODIN) 5-325 MG per tablet, Take 1 tablet by mouth every 6 (six) hours as needed. For pain, Disp: 10 tablet, Rfl: 0;  KLOR-CON M20 20 MEQ tablet, Take 20 mEq by mouth once. , Disp: , Rfl: ;  LIPITOR 40 MG tablet, Take 40 mg by mouth daily. , Disp: , Rfl: ;  metoprolol succinate (TOPROL-XL) 100 MG 24 hr tablet, Take 100 mg by mouth daily. , Disp: , Rfl:  Multiple Vitamins-Minerals (MULTIVITAMIN PO), Take 1 tablet by mouth daily., Disp: , Rfl: ;  nystatin (MYCOSTATIN) 100000 UNIT/ML suspension, Take 5 mLs (500,000 Units total) by mouth 4 (four) times daily. Swish 60secs through mouth,  then swallow.Use 3weeks or until bottle is empty, Disp: 473 mL, Rfl: 0;  omeprazole (PRILOSEC) 40 MG capsule, Take 40 mg by mouth daily. , Disp: , Rfl:  zolpidem (AMBIEN) 10 MG tablet, Take 10 mg by mouth at bedtime. , Disp: , Rfl: ;  ondansetron (ZOFRAN) 8 MG tablet, Take 1 pill 62min prior to Temodar, Disp: 30 tablet, Rfl: 3;  sulfamethoxazole-trimethoprim (BACTRIM DS,SEPTRA DS) 800-160 MG per tablet, Take 1 pill a day, Disp: 30 tablet, Rfl: 4;  temozolomide (TEMODAR) 180 MG capsule, Take 1 pill a day for 5 days at bedtime., Disp: 5 capsule, Rfl: 11 temozolomide (TEMODAR) 250 MG capsule, Take 1 pill a day for 5 days at bedtime, Disp: 5 capsule, Rfl:  11  Allergies:  Allergies  Allergen Reactions  . Nsaids Other (See Comments)    Not good for kidney    Past Medical History, Surgical history, Social history, and Family History were reviewed and updated.  Review of Systems: As above  Physical Exam:  height is 6\' 1"  (1.854 m) and weight is 236 lb (107.049 kg). His temperature is 98.2 F (36.8 C). His blood pressure is 115/73 and his pulse is 64. His respiration is 16.   Cushingoid appearing white male. He is in no distress. He is alert and oriented. Head and neck exam shows no adenopathy. He has no ocular or oral lesions. Pupils react appropriate leg. Lungs are clear. Cardiac exam regular rate and rhythm with no murmurs, rubs or bruits. Abdomen is soft, he is moderately obese. There is no fluid wave. There is no palpable liver or spleen tip. Extremities shows 1+ edema in his lower legs. He has good range of motion of his joints. He has 4/5 strength in his lower legs. He has 3/5 strength in his thighs. Neurological exam shows no focal neurological deficits. Back exam shows no tenderness over the spine, ribs or hips. Skin exam shows no rashes, or ecchymosis or petechia.  Lab Results  Component Value Date   WBC 17.4* 04/04/2014   HGB 13.4 04/04/2014   HCT 40.7 04/04/2014   MCV 92 04/04/2014   PLT 155 04/04/2014     Chemistry      Component Value Date/Time   NA 141 04/04/2014 1034   NA 138 02/28/2014 1048   NA 140 01/28/2014 0801   K 3.5 04/04/2014 1034   K 4.6 02/28/2014 1048   K 4.7 01/28/2014 0801   CL 97* 04/04/2014 1034   CL 106 01/28/2014 0801   CO2 29 04/04/2014 1034   CO2 22 02/28/2014 1048   CO2 28 01/28/2014 0801   BUN 33* 04/04/2014 1034   BUN 53.2* 02/28/2014 1048   BUN 31* 01/28/2014 0801   CREATININE 1.8* 04/04/2014 1034   CREATININE 2.5* 02/28/2014 1048   CREATININE 1.91* 01/28/2014 0801      Component Value Date/Time   CALCIUM 8.5 04/04/2014 1034   CALCIUM 9.2 02/28/2014 1048   CALCIUM 8.7 01/28/2014  0801   ALKPHOS 85* 04/04/2014 1034   ALKPHOS 106 02/28/2014 1048   ALKPHOS 78 01/28/2014 0801   AST 34 04/04/2014 1034   AST 45* 02/28/2014 1048   AST 14 01/28/2014 0801   ALT 70* 04/04/2014 1034   ALT 118* 02/28/2014 1048   ALT 30 01/28/2014 0801   BILITOT 1.00 04/04/2014 1034   BILITOT 0.35 02/28/2014 1048   BILITOT 0.4 01/28/2014 0801         Impression and Plan: Mr. Cheramie is 68 year old gentleman with a partially  resected glioblastoma of the right frontal lobe.  Of note, his tumor is not MGMT methylated. This typically indicates a more aggressive type tumor.  I'm glad that the recent MRI did show that he was responding.  Again, I spent about an hour with he and his wife. Mostly, try to deal with this issue with him not walking. Hopefully, the physical therapy will help.  He will start the high-dose Temodar the week of December 7.  I will plan to see him back in about 4 weeks or so.  Hopefully, by then, he will be able to walk.   Volanda Napoleon, MD 11/20/20154:20 PM

## 2014-04-07 NOTE — Progress Notes (Signed)
03-07-14  Brain Boost Complex Simulation and Treatment Planning Note  Diagnosis: glioblastoma  The patient's CT images from his simulation were reviewed to plan his boost treatment to his brain tumor bed. This boost will be delivered with IMRT VMAT technique with 6 MV photon energy.  This constitutes 2 complex treatment devices as two VMAT arcs will be used. Complex isodose plan was reviewed and approved. 14 Gy in 7 fractions prescribed.  -----------------------------------  Eppie Gibson, MD

## 2014-04-08 ENCOUNTER — Other Ambulatory Visit: Payer: Self-pay | Admitting: Radiation Oncology

## 2014-04-08 ENCOUNTER — Ambulatory Visit
Admission: RE | Admit: 2014-04-08 | Discharge: 2014-04-08 | Disposition: A | Discharge status: Home / Self Care | Payer: BC Managed Care – PPO | Source: Ambulatory Visit | Attending: Radiation Oncology | Admitting: Radiation Oncology

## 2014-04-08 DIAGNOSIS — C649 Malignant neoplasm of unspecified kidney, except renal pelvis: Secondary | ICD-10-CM

## 2014-04-09 NOTE — Progress Notes (Signed)
IMRT Device Note 02-06-14  C71.1 Glioblastoma  The patient's plan represents one set of IMRT treatment devices. This was approved by me. The code is [phone].  -----------------------------------  Eppie Gibson, MD

## 2014-04-09 NOTE — Progress Notes (Signed)
01-29-14 Addendum to treatment planning note:  IMRT was ultimately pursued for Mr. Ranganathan radiotherapy due to improved coverage of target volumes and sparing of normal tissues.  -----------------------------------  Eppie Gibson, MD

## 2014-04-14 ENCOUNTER — Other Ambulatory Visit: Payer: Self-pay | Admitting: *Deleted

## 2014-04-14 DIAGNOSIS — C711 Malignant neoplasm of frontal lobe: Secondary | ICD-10-CM

## 2014-04-14 MED ORDER — TEMOZOLOMIDE 250 MG PO CAPS: ORAL_CAPSULE | ORAL | Status: DC

## 2014-04-14 MED ORDER — TEMOZOLOMIDE 180 MG PO CAPS: ORAL_CAPSULE | ORAL | Status: DC

## 2014-04-16 ENCOUNTER — Ambulatory Visit: Payer: BC Managed Care – PPO | Attending: Hematology & Oncology

## 2014-04-16 DIAGNOSIS — R296 Repeated falls: Secondary | ICD-10-CM | POA: Insufficient documentation

## 2014-04-16 DIAGNOSIS — R5381 Other malaise: Secondary | ICD-10-CM | POA: Diagnosis not present

## 2014-04-16 DIAGNOSIS — R269 Unspecified abnormalities of gait and mobility: Secondary | ICD-10-CM | POA: Diagnosis not present

## 2014-04-16 DIAGNOSIS — M6281 Muscle weakness (generalized): Secondary | ICD-10-CM | POA: Diagnosis not present

## 2014-04-16 NOTE — Therapy (Addendum)
[facility] [address] [place], [place], [address] Phone: [phone]   Fax:  [phone]  Physical Therapy Evaluation  Patient Details  Name: Edward [name] MRN: [id number] Date of Birth: August 23, 1945  Encounter Date: 04/16/2014  PT session: Visit number: 1 Number of visits: 9 PT Start time: 0808 PT Stop time: 0845 PT time calculation (minutes): 37 Equipment utilized: Gait belt Patient tolerated treatment well. Behavior During Therapy: Flat affect  Past Medical History  Diagnosis Date  . HTN (hypertension)   . Hypercholesteremia   . Depression   . Cancer     Renal  . H/O diplopia 01/10/2013  . OSA on CPAP 04/08/2013    Past Surgical History  Procedure Laterality Date  . Kidney surgery  2009  . Penile prosthesis implant  2011  . Craniotomy N/A 01/01/2014    Procedure: CRANIOTOMY FOR TUMOR EXCISION;  Surgeon: Newman Pies, MD;  Location: [facility];  Service: Neurosurgery;  Laterality: N/A;  Craniotomy for tumor resection with stealth    There were no vitals taken for this visit.  Visit Diagnosis:  Abnormality of gait  Frequent falls  Muscle weakness (generalized)  Debility      Subjective Assessment - 04/16/14 0919    Symptoms Decreased strength and coordination, impaired balance, frequent falls, difficulty performing sit to stand.   Limitations Walking;Standing   Patient Stated Goals Decrease falls, improve balance, walk longer distances without fatigue so he can perform grocery shopping, traverse wooden steps at home, perform bathtub transfers safely, do laundry at home   Currently in Pain? No/denies          [facility] PT Assessment - 04/16/14 0808    Precautions   Precautions Fall   Precaution Comments Pt has fallen 10-12 times in last six months.   Restrictions   Weight Bearing Restrictions No   Balance Screen   Has the patient fallen in the past 6 months Yes   How many times? 12   Has the  patient had a decrease in activity level because of a fear of falling?  Yes   Is the patient reluctant to leave their home because of a fear of falling?  Yes   [place] Private residence   Living Arrangements Spouse/significant other   Available Help at Discharge Family   Type of [place] to enter   Entrance Stairs-Number of Steps 3  lift available if needed   Entrance Stairs-Rails None  carpeted stairs able to wooden stairs.    Home Layout Two level;Able to live on main level with bedroom/bathroom   Alternate Level Stairs-Number of Steps 14   Alternate Level Stairs-Rails Right  One rail on carpeted stairs and two on wooden stairs.   Home Equipment Walker - 2 wheels  elevated toilet seat   Prior Function   Level of Independence Independent with basic ADLs;Independent with homemaking with ambulation;Independent with gait;Independent with transfers   Vocation Retired  former Dance movement psychotherapist.   Cognition   Overall Cognitive Status Impaired/Different from baseline  per pt   Area of Impairment Memory   Memory Decreased short-term memory  pt was able to recall TUG time, 1 minute after test.   Observation/Other Assessments   Observations Pt has increased edema due to use of steroids for CA treatment, especially increased facial edema.   Sensation   Light Touch Impaired Detail  pt denied N/T.   Light Touch Impaired Details Impaired LUE  C6 dermatome   Coordination   Gross Motor Movements are Fluid and Coordinated Not tested  will test next visit.   Functional Tests   Functional tests Sit to Stand   Sit to Stand   Comments Min guard to supervision to ensure safety, as pt reported he has difficulty standing up from chair.   Posture/Postural Control   Posture/Postural Control Postural limitations   Postural Limitations Rounded Shoulders;Forward head;Posterior pelvic tilt;Decreased lumbar lordosis  decreased trunk rotation  during ambulation.   AROM   Overall AROM  Within functional limits for tasks performed   Overall AROM Comments WFL, however, pt reported R hand occasional experiences "freezing episodes" and he is unable to move hand and it shakes.   Strength   Overall Strength Deficits   Overall Strength Comments B UE WFL. B LE globally 4/5, except B hip adb. 3+/5 and L dorsiflexion 3+/5.   Transfers   Transfers Sit to Stand;Stand to Sit   Sit to Stand 5: Supervision;With armrests;From chair/3-in-1;With upper extremity assist   Sit to Stand Details (indicate cue type and reason) Supervision for safety as pt reported occasional difficulty with sit to stand.   Stand to Sit 5: Supervision;To chair/3-in-1;With armrests;With upper extremity assist   Ambulation/Gait   Ambulation/Gait Yes   Ambulation/Gait Assistance 5: Supervision;4: Min guard   Ambulation/Gait Assistance Details Pt ambulated over even terrain without LOB episode, with pt reporting "that's the best my walking has looked in weeks" and his wife agreed. Pt did require a seated rest break after ambulation due to SOB and fatigue.   Ambulation Distance (Feet) 100 Feet   Assistive device None   Gait Pattern Step-through pattern;Decreased dorsiflexion - left;Decreased trunk rotation   Gait velocity 3.66ft/sec.  10 MWT   Balance   Balance Assessed Yes   Static Standing Balance   Static Standing - Balance Support No upper extremity supported   Static Standing - Level of Assistance Other (comment);4: Min assist  min guard   Static Standing Balance -  Activities  Single Leg Stance - Right Leg;Tandam Stance - Right Leg  feet together, feet apart with eyes open/closed   Static Standing - Comment/# of Minutes Pt unable to perform tandem or single leg stance with Min A to maintain balance.                  PT Long Term Goals - 04/16/14 1616    PT LONG TERM GOAL #1   Title Pt will verbalize agreement and understanding of falls prevention  strategies. (05/14/14).   Status New   PT LONG TERM GOAL #2   Title Pt will be independent in HEP in order to improve functional mobility. (05/14/14).   Status New   PT LONG TERM GOAL #3   Title Assess BERG and write goal. (05/14/14).   Status New   PT LONG TERM GOAL #4   Title Pt will be able to ascend/descend 14 steps with two handrails, in step over pattern, at MOD I level to improve functional mobility. (05/14/14).   Status New   PT LONG TERM GOAL #5   Title Pt will report he is able to run 2 loads of laundry per week, without assistance from his wife, in order to be independent in ADLs. (05/14/14).   Status New   Additional Long Term Goals   Additional Long Term Goals Yes   PT LONG TERM GOAL #6   Title Pt will be able to perform all tub transfers at MOD  I level to improve functional mobility. (05/14/14).   Status New   PT LONG TERM GOAL #7   Title Pt will improve Neuro QOL-LE questionnaire by 10 points to improve quality of life. (05/14/14).   Status New   PT LONG TERM GOAL #8   Title Assess DGI and write appropriate goal. (05/14/14).   Status New          Plan - 04/16/14 0816    Clinical Impression Statement Pt is a pleasant 68 y/o male presenting to OPPT neuro s/p resection of R frontal lobe glioblastoma in 12/2013, with c/o of impaired balance and frequent falls. Pt was in the hospital for 1.5 weeks after 12/2013 tumor resection. Pt and pt's wife reported balance symptoms worsened began during chemo treatment and worsened around Nov. 8th, after chemo and radiation treatment. Pt and MD notes report impaired balance and weakness is likely due to steroid treatment.   Pt has fallen 10-12 times in last six months while walking and traversing stairs. Pt reported he experiences lightheadedness prior to falling.    Pt will benefit from skilled therapeutic intervention in order to improve on the following deficits Abnormal gait;Decreased endurance;Decreased balance;Decreased  mobility;Increased edema;Impaired flexibility;Decreased coordination;Decreased strength;Decreased knowledge of use of DME   Rehab Potential Good   PT Frequency 2x / week   PT Duration 4 weeks   PT Treatment/Interventions ADLs/Self Care Home Management;Gait training;Neuromuscular re-education;Functional mobility training;Patient/family education;Therapeutic activities;Therapeutic exercise;Electrical Stimulation;Manual techniques;Balance training;DME Instruction   PT Next Visit Plan Perform DGI and BERG. Assess coordination. Provide balance/strengthening HEP.                              Problem List Patient Active Problem List   Diagnosis Date Noted  . Glioblastoma 01/08/2014  . Brain tumor 01/01/2014  . Lung mass 12/30/2013  . Tobacco use disorder 12/30/2013  . Brain mass 12/29/2013  . Benign essential HTN 12/29/2013  . Hyperlipidemia 12/29/2013  . Vasogenic cerebral edema 12/29/2013  . ARF (acute renal failure) 12/29/2013  . Hx of renal cell cancer 12/29/2013  . OSA on CPAP 04/08/2013  . H/O diplopia 01/10/2013    Muslima Toppins L 04/16/2014, 4:31 PM      Geoffry Paradise, PT,DPT 04/16/2014 4:31 PM Phone: [phone] Fax: [phone]

## 2014-04-18 ENCOUNTER — Encounter: Payer: Self-pay | Admitting: *Deleted

## 2014-04-18 ENCOUNTER — Ambulatory Visit: Payer: Self-pay | Admitting: Radiation Oncology

## 2014-04-21 ENCOUNTER — Telehealth: Payer: Self-pay

## 2014-04-21 ENCOUNTER — Ambulatory Visit: Payer: BC Managed Care – PPO | Admitting: Radiation Oncology

## 2014-04-21 NOTE — Telephone Encounter (Signed)
Received call from wife questioning if pt could hold Temodar until he is evaluated by cardiology next week. Also reports seeing minimal progress in edema & ambulation since last visit. Pt will begin PT this week.  Per Dr Marin Olp, [place] to hold Temodar until next Monday 12/14 & encouraged spouse that Dr Marin Olp expects pt's performance status to continue to improve with lower decadron dose and PT. Verbalizes understanding. dph

## 2014-04-22 ENCOUNTER — Encounter: Payer: Self-pay | Admitting: Radiation Oncology

## 2014-04-23 ENCOUNTER — Ambulatory Visit: Payer: BC Managed Care – PPO

## 2014-04-23 DIAGNOSIS — M6281 Muscle weakness (generalized): Secondary | ICD-10-CM

## 2014-04-23 DIAGNOSIS — R5381 Other malaise: Secondary | ICD-10-CM

## 2014-04-23 DIAGNOSIS — R269 Unspecified abnormalities of gait and mobility: Secondary | ICD-10-CM

## 2014-04-23 DIAGNOSIS — R296 Repeated falls: Secondary | ICD-10-CM

## 2014-04-23 NOTE — Patient Instructions (Addendum)
Perform all balance activities in a corner with chair in front of you for safety.  Feet Apart (Compliant Surface) Head Motion - Eyes Open   Stand on compliant surface: ___pillow_____ with feet shoulder width apart. With eyes open and move head slowly, up and down and side to side for 30 seconds. Repeat _3___ times per session. Do _1___ sessions per day.  Copyright  VHI. All rights reserved.  Feet Heel-Toe "Tandem", Varied Arm Positions - Eyes Open   With eyes open, right foot directly in front of the other, arms at your side, look straight ahead at a stationary object. Hold _10-30___ seconds. Repeat with L foot in front. Repeat _3___ times per session. Do _1___ sessions per day.  Copyright  VHI. All rights reserved.  Feet Together, Varied Arm Positions - Eyes Closed   Stand with feet together and arms at your side. Close eyes and visualize upright position. Hold _30___ seconds. Repeat _3___ times per session. Do _1___ sessions per day.  Copyright  VHI. All rights reserved.  Single Leg - Eyes Open   Holding support, lift right leg while maintaining balance over other leg. Progress to removing hands from support surface for longer periods of time. Hold_10-30___ seconds. Repeat with L leg. Repeat __3__ times per session. Do __1__ sessions per day.  Copyright  VHI. All rights reserved.

## 2014-04-23 NOTE — Therapy (Signed)
[facility] [address] [place], [place], [address] Phone: [phone]   Fax:  [phone]  Physical Therapy Treatment  Patient Details  Name: Edward [name] MRN: [id number] Date of Birth: 03-Jul-1945  Encounter Date: 04/23/2014      PT End of Session - 04/23/14 0907    Visit Number 2   Number of Visits 9   PT Start Time 0811   PT Stop Time 0850   PT Time Calculation (min) 39 min   Equipment Utilized During Treatment Gait belt   Activity Tolerance Patient tolerated treatment well      Past Medical History  Diagnosis Date  . HTN (hypertension)   . Hypercholesteremia   . Depression   . Cancer     Renal  . H/O diplopia 01/10/2013  . OSA on CPAP 04/08/2013  . S/P radiation therapy 02/06/2014-03/20/2014    1) Right frontal lobe tumor bed initial / 46 Gy in 23 fractions/ 2) Right frontal lobe tumor bed boost / 14 Gy in 7 fractions      Past Surgical History  Procedure Laterality Date  . Kidney surgery  2009  . Penile prosthesis implant  2011  . Craniotomy N/A 01/01/2014    Procedure: CRANIOTOMY FOR TUMOR EXCISION;  Surgeon: Newman Pies, MD;  Location: [place] [facility];  Service: Neurosurgery;  Laterality: N/A;  Craniotomy for tumor resection with stealth    There were no vitals taken for this visit.  Visit Diagnosis:  Abnormality of gait  Frequent falls  Muscle weakness (generalized)  Debility      Subjective Assessment - 04/23/14 0815    Symptoms Pt reported he fell last Friday, during ambulation, he felt lightheadedness and then fell.     Currently in Pain? No/denies            [facility] Adult PT Treatment/Exercise - 04/23/14 0817    Balance   Balance Assessed Yes   Static Standing Balance   Static Standing - Balance Support No upper extremity supported  in corner with chair in front of pt for safety.   Static Standing - Level of Assistance 5: Stand by assistance;Other (comment)  min guard   Static  Standing - Comment/# of Minutes BLE: Feet apart (compliant and non-compliant surface), feet together, tandem stance, single leg stance: with 10-30 second holds and 1-2 reps. VC's to improve weight shifting and technique.  Performed with eyes open/closed, with and without head turns. Four LOB episodes, corrected with UE support on chair and/or wall, and min guard by PT.   Standardized Balance Assessment   Standardized Balance Assessment Berg Balance Test;Dynamic Gait Index   Berg Balance Test   Sit to Stand Able to stand  independently using hands   Standing Unsupported Able to stand safely 2 minutes   Sitting with Back Unsupported but Feet Supported on Floor or Stool Able to sit safely and securely 2 minutes   Stand to Sit Sits safely with minimal use of hands   Transfers Able to transfer safely, definite need of hands   Standing Unsupported with Eyes Closed Able to stand 10 seconds with supervision   Standing Ubsupported with Feet Together Able to place feet together independently and stand for 1 minute with supervision   From Standing, Reach Forward with Outstretched Arm Can reach confidently >25 cm (10")   From Standing Position, Pick up Object from Floor Able to pick up shoe, needs supervision   From Standing Position, Turn to Look Behind Over each Shoulder  Looks behind from both sides and weight shifts well   Turn 360 Degrees Able to turn 360 degrees safely one side only in 4 seconds or less   Standing Unsupported, Alternately Place Feet on Step/Stool Able to complete 4 steps without aid or supervision   Standing Unsupported, One Foot in Front Able to plae foot ahead of the other independently and hold 30 seconds   Standing on One Leg Able to lift leg independently and hold equal to or more than 3 seconds   Total Score 45   Dynamic Gait Index   Level Surface Mild Impairment   Change in Gait Speed Mild Impairment   Gait with Horizontal Head Turns Mild Impairment   Gait with Vertical Head  Turns Mild Impairment   Gait and Pivot Turn Mild Impairment   Step Over Obstacle Normal   Step Around Obstacles Mild Impairment   Steps Mild Impairment   Total Score 17          PT Education - 04/23/14 0906    Education provided Yes   Education Details Balance HEP and discussed the importance of sitting down as soon as pt experiences lightheadedness to prevent falls. PT also strongly encouraged pt to inform MD of recent fall, as pt hit his head on a table and has been experiencing headaches intermittently since fall.   Person(s) Educated Patient;Spouse   Methods Explanation;Demonstration;Verbal cues   Comprehension Verbalized understanding;Returned demonstration            PT Long Term Goals - 04/23/14 0909    PT LONG TERM GOAL #1   Title Pt will verbalize agreement and understanding of falls prevention strategies. (05/14/14).   Status On-going   PT LONG TERM GOAL #2   Title Pt will be independent in HEP in order to improve functional mobility. (05/14/14).   Status On-going   PT LONG TERM GOAL #3   Title Assess BERG and write goal. (05/14/14).   Status Achieved   PT LONG TERM GOAL #4   Title Pt will be able to ascend/descend 14 steps with two handrails, in step over pattern, at MOD I level to improve functional mobility. (05/14/14).   Status On-going   PT LONG TERM GOAL #5   Title Pt will report he is able to run 2 loads of laundry per week, without assistance from his wife, in order to be independent in ADLs. (05/14/14).   Status On-going   Additional Long Term Goals   Additional Long Term Goals Yes   PT LONG TERM GOAL #6   Title Pt will be able to perform all tub transfers at MOD I level to improve functional mobility. (05/14/14).   Status On-going   PT LONG TERM GOAL #7   Title Pt will improve Neuro QOL-LE questionnaire by 10 points to improve quality of life. (05/14/14).   Status On-going   PT LONG TERM GOAL #8   Title Assess DGI and write appropriate goal.  (05/14/14).   Status Achieved   PT LONG TERM GOAL  #9   TITLE Pt will improve BERG score to >/=49/56 to reduce falls risk. Target date: 05/14/14.   Status New   PT LONG TERM GOAL  #10   TITLE Pt will improve DGI score to >/=20/24 to reduce falls risk during ambulation. Target date: 05/14/14.   Status New          Plan - 04/23/14 0908    Clinical Impression Statement Pt scored 17/24 on DGI and 45/56 on BERG,  indicating he is at risk for falls. Pt would continue to benefit from skilled therapy to improve safety during functional mobility.   Pt will benefit from skilled therapeutic intervention in order to improve on the following deficits Abnormal gait;Decreased endurance;Decreased balance;Decreased mobility;Increased edema;Impaired flexibility;Decreased coordination;Decreased strength;Decreased knowledge of use of DME   Rehab Potential Good   PT Frequency 2x / week   PT Duration 4 weeks   PT Treatment/Interventions ADLs/Self Care Home Management;Gait training;Neuromuscular re-education;Functional mobility training;Patient/family education;Therapeutic activities;Therapeutic exercise;Electrical Stimulation;Manual techniques;Balance training;DME Instruction   PT Next Visit Plan Provide strengthening HEP. Dynamic gait/balance activities   Consulted and Agree with Plan of Care Patient;Family member/caregiver   Family Member Consulted pt's wife                               Problem List Patient Active Problem List   Diagnosis Date Noted  . Glioblastoma 01/08/2014  . Brain tumor 01/01/2014  . Lung mass 12/30/2013  . Tobacco use disorder 12/30/2013  . Brain mass 12/29/2013  . Benign essential HTN 12/29/2013  . Hyperlipidemia 12/29/2013  . Vasogenic cerebral edema 12/29/2013  . ARF (acute renal failure) 12/29/2013  . Hx of renal cell cancer 12/29/2013  . OSA on CPAP 04/08/2013  . H/O diplopia 01/10/2013    Cristi Gwynn L 04/23/2014, 9:17 AM   Geoffry Paradise, PT,DPT 04/23/2014 9:17 AM Phone: [phone] Fax: [phone]

## 2014-04-25 ENCOUNTER — Ambulatory Visit: Payer: BC Managed Care – PPO

## 2014-04-25 ENCOUNTER — Ambulatory Visit: Payer: BC Managed Care – PPO | Admitting: Radiation Oncology

## 2014-04-25 ENCOUNTER — Ambulatory Visit
Admission: RE | Admit: 2014-04-25 | Discharge: 2014-04-25 | Disposition: A | Discharge status: Home / Self Care | Payer: BC Managed Care – PPO | Source: Ambulatory Visit | Attending: Radiation Oncology | Admitting: Radiation Oncology

## 2014-04-25 ENCOUNTER — Encounter: Payer: Self-pay | Admitting: Radiation Oncology

## 2014-04-25 VITALS — BP 120/80 | HR 98 | Temp 98.2°F | Ht 73.0 in | Wt 240.5 lb

## 2014-04-25 DIAGNOSIS — R296 Repeated falls: Secondary | ICD-10-CM

## 2014-04-25 DIAGNOSIS — M6281 Muscle weakness (generalized): Secondary | ICD-10-CM

## 2014-04-25 DIAGNOSIS — R5381 Other malaise: Secondary | ICD-10-CM

## 2014-04-25 DIAGNOSIS — R269 Unspecified abnormalities of gait and mobility: Secondary | ICD-10-CM

## 2014-04-25 DIAGNOSIS — C711 Malignant neoplasm of frontal lobe: Secondary | ICD-10-CM

## 2014-04-25 HISTORY — DX: Personal history of irradiation: Z92.3

## 2014-04-25 NOTE — Progress Notes (Signed)
Radiation Oncology         [phone]) [phone] ________________________________  Name: Edward [name] MRN: [id number]  Date: 04/25/2014  DOB: [birth year]  Follow-Up Visit Note  Outpatient  CC: Edward Pac, MD  Edward Miss, MD  Diagnosis and Prior Radiotherapy:    ICD-9-CM ICD-10-CM   1. 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  Edward [name] here s/p radiation to his brain.  He denies any nausea, vision changes, but reports ataxia requiring physical therapy.  He admits to falling several times recently with injury to his head without any lasting repercussions. Travel by W/C.  He also admits to having difficulty buttoning clothes and notes difficulty typing, notably in the left hand.  Continues on decadron.  He believes he is taking 59m daily but is not certain. With nephew today who drove him.  [facility]therapy) looks excellent at was reviewed at our tumor board. Next MRI on 1/13.  [facility] to [facility]  He also to my observation has had masked faces since I met him.                         ALLERGIES:  is allergic to nsaids.  Meds: Current Outpatient Prescriptions  Medication Sig Dispense Refill  . allopurinol (ZYLOPRIM) 100 MG tablet Take by mouth.    .Marland Kitchenatorvastatin (LIPITOR) 40 MG tablet Take by mouth.    .Marland KitchenbuPROPion (WELLBUTRIN XL) 300 MG 24 hr tablet Take 300 mg by mouth daily.    . citalopram (CELEXA) 40 MG tablet Take 1 tablet (40 mg total) by mouth daily. 30 tablet 2  . dexamethasone (DECADRON) 4 MG tablet Take 2 tablets (8 mg total) by mouth 2 (two)  times daily. Take with food. (Patient taking differently: Take 4 mg by mouth daily. Take with food.) 120 tablet 0  . furosemide (LASIX) 40 MG tablet Take by mouth. 40 mg in the am and 20 mg po in the afternoon    . KLOR-CON M20 20 MEQ tablet Take 20 mEq by mouth once.     . metoprolol succinate (TOPROL-XL) 100 MG 24 hr tablet Take 100 mg by mouth daily.     . Multiple Vitamins-Minerals (MULTIVITAMIN PO) Take 1 tablet by mouth daily.    .Marland Kitchenomeprazole (PRILOSEC) 40 MG capsule Take 40 mg by mouth daily.     . ondansetron (ZOFRAN) 8 MG tablet Take 1 pill 349m prior to Temodar 30 tablet 3  . temozolomide (TEMODAR) 180 MG capsule Take 1 pill a day for 5 days at bedtime. 5 capsule 11  . temozolomide (TEMODAR) 250 MG capsule Take 1 pill a day for 5 days at bedtime 5 capsule 11  . nystatin (MYCOSTATIN) 100000 UNIT/ML suspension Take 5 mLs (500,000 Units total) by mouth 4 (four) times daily. Swish 60secs through mouth, then swallow.Use 3weeks or until bottle is empty (Patient not taking: Reported on 04/25/2014) 473 mL 0  . zolpidem (AMBIEN) 10 MG tablet Take 10 mg by mouth at bedtime.      No current facility-administered medications  for this encounter.    Physical Findings: The patient is in no acute distress. height is _0  (1.854 m) and weight is 240 lb 8 oz (109.09 kg). His temperature is 98.2 F (36.8 C). His blood pressure is 120/80 and his pulse is 98. .    Oral cavity - no thrush. Masked faces Ankle edema Strength grossly symmetric in arms and legs Finger to nose grossly intract. Speech fluent Forgetful about events when giving history today. Travel by wheelchair   Lab Findings: Lab Results  Component Value Date   WBC 17.4* 04/04/2014   HGB 13.4 04/04/2014   HCT 40.7 04/04/2014   MCV 92 04/04/2014   PLT 155 04/04/2014    Radiographic Findings: [facility]  HISTORY:  Follow-up glioblastoma status post right frontal craniotomy and chemotherapy most recently  03/31/2014.  PROCEDURE:  MRI of the brain without and with intravenous contrast.  Multiplanar multisequence imaging. Creatinine 2.1 calculated GFR 32, therefore half dose contrast administered, and patient instructed to increase fluids. He received 10 mL intravenous MultiHance.  COMPARISON:  [facility] 01/03/2014 MRI (images only, prior report not available.)  FINDINGS: The patient is status post right frontal craniotomy.  Currently the resection cavity in the right frontal lobe has decreased in size. There is residual enhancement along the margin of the resection cavity though decreased from 01/03/2014. Signal loss on gradient echo images surrounding the cavity is also noted consistent with evolution of blood breakdown products.  There has been an interval decrease in mass effect. A T2/FLAIR hyperintense frontal gyrus, immediately posterior to the anterior falx, does protrude across midline by approximately 4.5 mm, though this is less prominent than previous. There is substantial interval reduction in T2 hyperintensity within the right frontal lobe, primarily involving the white matter. T2 hyperintensity of the periventricular white matter extending along the body of the lateral ventricles appears to have increased slightly. No distant site of abnormal enhancement noted. There is diffusion restriction within the resection bed but no site suggestive of acute infarction. No significant volume of extra-axial fluid noted. Findings consistent with retention cysts are present in the maxillary sinuses bilaterally.  IMPRESSION: 1.  Interval decrease in size and mass effect associated with the right frontal resection cavity, with interval decrease in contrast enhancement and edema adjacent to the resection cavity. 2. Moderate T2 hyperintensity of the periventricular white matter which appears to have increased from prior study. Correlate with treatment history.  Electronically  Signed by:  Edward Modena, MD Electronically Signed on:  03/31/2014 4:53 PM  Impression/Plan:  Good radiologic response to therapy.  MRI at [facility] 05/28/14  Continue current Decadron dosing until tapered by Dr Marin Olp or [facility]  Patient wises to talk to [facility] for neurology referral.  Declines OT but will continue PT.   F/u with me in Feb 2016. _____________________________________   Eppie Gibson, MD

## 2014-04-25 NOTE — Progress Notes (Signed)
Edward [name] here s/p radiation to his brain.  He denies any nausea, vision changes, but reports ataxia requiring physical therapy.  He admits to falling several times recently with injury to his head without any lasting repercussions. Travel by W/C.  He also admits to having difficulty buttoning clothes and notes difficulty typing, notably in the left hand.  Continues on decadron. Mouth is clear and moist.

## 2014-04-25 NOTE — Therapy (Signed)
[facility] [address] [place], [place], [address] Phone: [phone]   Fax:  [phone]  Physical Therapy Treatment  Patient Details  Name: Edward [name] MRN: [id number] Date of Birth: 10-Feb-1946  Encounter Date: 04/25/2014      PT End of Session - 04/25/14 1430    Visit Number 3   Number of Visits 9   PT Start Time 0850   PT Stop Time 0929   PT Time Calculation (min) 39 min   Equipment Utilized During Treatment Gait belt   Activity Tolerance Patient tolerated treatment well   Behavior During Therapy Flat affect      Past Medical History  Diagnosis Date  . HTN (hypertension)   . Hypercholesteremia   . Depression   . Cancer     Renal  . H/O diplopia 01/10/2013  . OSA on CPAP 04/08/2013  . S/P radiation therapy 02/06/2014-03/20/2014    1) Right frontal lobe tumor bed initial / 46 Gy in 23 fractions/ 2) Right frontal lobe tumor bed boost / 14 Gy in 7 fractions      Past Surgical History  Procedure Laterality Date  . Kidney surgery  2009  . Penile prosthesis implant  2011  . Craniotomy N/A 01/01/2014    Procedure: CRANIOTOMY FOR TUMOR EXCISION;  Surgeon: Newman Pies, MD;  Location: [place] [facility];  Service: Neurosurgery;  Laterality: N/A;  Craniotomy for tumor resection with stealth    There were no vitals taken for this visit.  Visit Diagnosis:  Abnormality of gait  Frequent falls  Muscle weakness (generalized)  Debility      Subjective Assessment - 04/25/14 0854    Symptoms Pt arrived 5 minutes late. Pt reported he over did it on Wednesday, as he traversed stairs and did three loads of laundry. Pt denied any falls since last visit but had a few close calls due to lightheadedness.    Patient Stated Goals Decrease falls, improve balance, walk longer distances without fatigue so he can perform grocery shopping, traverse wooden steps at home, perform bathtub transfers safely, do laundry at home   Currently  in Pain? No/denies            [facility] Adult PT Treatment/Exercise - 04/25/14 0906    Ambulation/Gait   Ambulation/Gait Yes   Ambulation/Gait Assistance 5: Supervision;4: Min guard   Ambulation/Gait Assistance Details Pt ambulated over even terrain, while performing head turns. Pt noted to experience decr. stride length and L dorsiflexion while performing head turns and pt's cadence increases. VC's to improve stride length and L heel strike. Pt required rest breaks during ambulation due to fatigue.   Ambulation Distance (Feet) --  75', 117'x3   Assistive device None   Gait Pattern Step-through pattern;Decreased dorsiflexion - left;Decreased trunk rotation   Exercises   Exercises Knee/Hip   Knee/Hip Exercises: Standing   Heel Raises 3 sets;10 reps   Heel Raises Limitations B UE support. VC's for technique.   Other Standing Knee Exercises Standing: BLE with UE support at counter: toe raises x20, hip abd/ext 3x10/LE. VC's to keep knees extended and to improve upright posture. Pt required seated rest break after hip abd/ext exercises           PT Education - 04/25/14 1430    Education provided Yes   Education Details Strengthening HEP. PT  Educated pt on not "over doing it", and to try 1-2 loads of laundry and chores throughout the week vs. In one day to prevent fatigue.  Person(s) Educated Associate Professor)  pt's nephew, Jenny Reichmann   Methods Explanation;Demonstration;Tactile cues;Verbal cues;Handout   Comprehension Verbalized understanding;Returned demonstration;Need further instruction            PT Long Term Goals - 04/25/14 1432    PT LONG TERM GOAL #1   Title Pt will verbalize agreement and understanding of falls prevention strategies. (05/14/14).   Status On-going   PT LONG TERM GOAL #2   Title Pt will be independent in HEP in order to improve functional mobility. (05/14/14).   Status On-going   PT LONG TERM GOAL #3   Title Assess BERG and write goal. (05/14/14).    Status Achieved   PT LONG TERM GOAL #4   Title Pt will be able to ascend/descend 14 steps with two handrails, in step over pattern, at MOD I level to improve functional mobility. (05/14/14).   Status On-going   PT LONG TERM GOAL #5   Title Pt will report he is able to run 2 loads of laundry per week, without assistance from his wife, in order to be independent in ADLs. (05/14/14).   Status On-going   PT LONG TERM GOAL #6   Title Pt will be able to perform all tub transfers at MOD I level to improve functional mobility. (05/14/14).   Status On-going   PT LONG TERM GOAL #7   Title Pt will improve Neuro QOL-LE questionnaire by 10 points to improve quality of life. (05/14/14).   Status On-going   PT LONG TERM GOAL #8   Title Assess DGI and write appropriate goal. (05/14/14).   Status Achieved   PT LONG TERM GOAL  #9   TITLE Pt will improve BERG score to >/=49/56 to reduce falls risk. Target date: 05/14/14.   Status New   PT LONG TERM GOAL  #10   TITLE Pt will improve DGI score to >/=20/24 to reduce falls risk during ambulation. Target date: 05/14/14.   Status New          Plan - 04/25/14 1430    Clinical Impression Statement Pt demonstrated progress as he was able to tolerate ambulation and therex without LOB. However, pt did require frequent seated rest breaks due to fatigue. Pt would continue to benefit from skilled PT to improve functional mobility.   Pt will benefit from skilled therapeutic intervention in order to improve on the following deficits Abnormal gait;Decreased endurance;Decreased balance;Decreased mobility;Increased edema;Impaired flexibility;Decreased coordination;Decreased strength;Decreased knowledge of use of DME   Rehab Potential Good   PT Frequency 2x / week   PT Duration 4 weeks   PT Treatment/Interventions ADLs/Self Care Home Management;Gait training;Neuromuscular re-education;Functional mobility training;Patient/family education;Therapeutic activities;Therapeutic  exercise;Electrical Stimulation;Manual techniques;Balance training;DME Instruction   PT Next Visit Plan Review strengthening HEP. Dynamic gait/balance activities   Consulted and Agree with Plan of Care Patient;Family member/caregiver   Family Member Consulted pt's nephew, Jenny Reichmann.                               Problem List Patient Active Problem List   Diagnosis Date Noted  . Glioblastoma 01/08/2014  . Brain tumor 01/01/2014  . Lung mass 12/30/2013  . Tobacco use disorder 12/30/2013  . Brain mass 12/29/2013  . Benign essential HTN 12/29/2013  . Hyperlipidemia 12/29/2013  . Vasogenic cerebral edema 12/29/2013  . ARF (acute renal failure) 12/29/2013  . Hx of renal cell cancer 12/29/2013  . OSA on CPAP 04/08/2013  . H/O diplopia 01/10/2013  Lyllian Gause L 04/25/2014, 2:33 PM     Geoffry Paradise, PT,DPT 04/25/2014 2:33 PM Phone: [phone] Fax: [phone]

## 2014-04-25 NOTE — Patient Instructions (Signed)
Stand at counter with two hands on counter for support:  Weight Shift: Anterior / Posterior (Limits of Stability)   Slowly shift weight backward until toes begin to rise off floor. Return to starting position. Shift weight slowly forward until heels begin to rise off floor. Hold each position _2___ seconds. Repeat __20__ times per session. Do _1___ sessions per day.   Copyright  VHI. All rights reserved.  Back Leg Kick   Swing leg back as far as possible. Return to center. Repeat with other leg. Repeat _3 sets of 10 per leg___ . Do __1__ sessions per day.  http://gt2.exer.us/483   Copyright  VHI. All rights reserved.  ABDUCTION: Standing (Active)   Stand, feet flat and take a half step back with R leg. Lift right leg out to side. Repeat 10 times, switch and perform with L leg. Make sure to stand upright. Complete _3__ sets of _10__ repetitions. Perform _1__ sessions per day.  http://gtsc.exer.us/110   Copyright  VHI. All rights reserved.

## 2014-04-28 ENCOUNTER — Encounter: Payer: Self-pay | Admitting: Cardiology

## 2014-04-28 ENCOUNTER — Ambulatory Visit (INDEPENDENT_AMBULATORY_CARE_PROVIDER_SITE_OTHER): Payer: BC Managed Care – PPO | Admitting: Cardiology

## 2014-04-28 VITALS — BP 128/88 | HR 70 | Ht 73.0 in | Wt 238.0 lb

## 2014-04-28 DIAGNOSIS — R55 Syncope and collapse: Secondary | ICD-10-CM

## 2014-04-28 DIAGNOSIS — I10 Essential (primary) hypertension: Secondary | ICD-10-CM

## 2014-04-28 NOTE — Progress Notes (Signed)
[facility]. [address], [address], [place]  [address] Phone: [phone] Fax:  [phone]  Date:  04/28/2014   ID:  Edward [name], DOB [birth year], MRN [id number]  PCP:  Cari Caraway, MD   History of Present Illness: Edward [name] is a 68 y.o. male with glioblastoma, finished chemotherapy on 03/20/2014 with regression of tumor here for evaluation of syncope. Previously complained of confusion with several episodes of feeling weekend, minimally short of breath. He fell 3 times. 2 weeks ago. Hit head on coffee table. Felt it coming on. Has little upper body strength, he states.  Come on when standing up. Gets weak, looses balance. Never had problems before. Neuro at [facility] though she had irreg HB. Did not feel as though he completely "blacked out". Does not remember having palpitations, tachycardia. No associated seizures. Has never had any prior cardiac history. Can feel dyspnea when going up stairs, feels weak, has to stop, fear of falling. At refrig, slow gradual fall to ground. He went through chemo at [facility]. Was having trouble walking post chemo despite chemo results. Local oncologist though he was weak because of steroids. Taper has occurred. Has had left renal cell cancer status post nephrectomy, depression, basal cell carcinoma, tobacco use, spinal stenosis. He had a neurologic evaluation at [facility] after this event which apparently was normal. Hemoglobin 13.5, creatinine 1.95 with BUN of 41-slightly elevated when compared to prior.   Wt Readings from Last 3 Encounters:  04/28/14 238 lb (107.956 kg)  04/25/14 240 lb 8 oz (109.09 kg)  04/04/14 236 lb (107.049 kg)     Past Medical History  Diagnosis Date  . HTN (hypertension)   . Hypercholesteremia   . Depression   . Cancer     Renal  . H/O diplopia 01/10/2013  . OSA on CPAP 04/08/2013  . S/P radiation therapy 02/06/2014-03/20/2014    1) Right frontal lobe tumor bed initial / 46 Gy in 23 fractions/ 2) Right  frontal lobe tumor bed boost / 14 Gy in 7 fractions      Past Surgical History  Procedure Laterality Date  . Kidney surgery  2009  . Penile prosthesis implant  2011  . Craniotomy N/A 01/01/2014    Procedure: CRANIOTOMY FOR TUMOR EXCISION;  Surgeon: Newman Pies, MD;  Location: [place] [facility];  Service: Neurosurgery;  Laterality: N/A;  Craniotomy for tumor resection with stealth    Current Outpatient Prescriptions  Medication Sig Dispense Refill  . allopurinol (ZYLOPRIM) 100 MG tablet Take by mouth.    Marland Kitchen atorvastatin (LIPITOR) 40 MG tablet Take by mouth.    Marland Kitchen buPROPion (WELLBUTRIN XL) 300 MG 24 hr tablet Take 300 mg by mouth daily.    . citalopram (CELEXA) 40 MG tablet Take 1 tablet (40 mg total) by mouth daily. 30 tablet 2  . dexamethasone (DECADRON) 2 MG tablet Take 2 mg by mouth 2 (two) times daily with a meal.    . furosemide (LASIX) 40 MG tablet Take by mouth. 40 mg in the am and 20 mg po in the afternoon    . KLOR-CON M20 20 MEQ tablet Take 20 mEq by mouth once.     . metoprolol succinate (TOPROL-XL) 100 MG 24 hr tablet Take 100 mg by mouth daily.     . Multiple Vitamins-Minerals (MULTIVITAMIN PO) Take 1 tablet by mouth daily.    Marland Kitchen nystatin (MYCOSTATIN) 100000 UNIT/ML suspension Take 5 mLs (500,000 Units total) by mouth 4 (four) times daily.  Swish 60secs through mouth, then swallow.Use 3weeks or until bottle is empty 473 mL 0  . omeprazole (PRILOSEC) 40 MG capsule Take 40 mg by mouth daily.     . ondansetron (ZOFRAN) 8 MG tablet Take 1 pill 50min prior to Temodar 30 tablet 3  . temozolomide (TEMODAR) 180 MG capsule Take 1 pill a day for 5 days at bedtime. 5 capsule 11  . temozolomide (TEMODAR) 250 MG capsule Take 1 pill a day for 5 days at bedtime 5 capsule 11  . zolpidem (AMBIEN) 10 MG tablet Take 10 mg by mouth at bedtime.      No current facility-administered medications for this visit.    Allergies:    Allergies  Allergen Reactions  . Nsaids Other (See Comments)    Not  good for kidney    Social History:  The patient  reports that he has been smoking Cigarettes.  He started smoking about 53 years ago. He has a 53 pack-year smoking history. He has never used smokeless tobacco. He reports that he does not drink alcohol or use illicit drugs.   Family History  Problem Relation Age of Onset  . Cancer Mother   . Heart failure Father    father had myocardial infarction at age 72.  ROS:  Please see the history of present illness.   Positive for sensation of falling out. Positive for glioblastoma.   All other systems reviewed and negative.   PHYSICAL EXAM: VS:  BP 128/88 mmHg  Pulse 70  Ht 6\' 1"  (1.854 m)  Wt 238 lb (107.956 kg)  BMI 31.41 kg/m2 Well nourished, well developed, in no acute distress HEENT: normal, [place]/AT, EOMI, moon like facies Neck: no JVD, normal carotid upstroke, no bruit Cardiac:  normal S1, S2; RRR; no murmur Lungs:  clear to auscultation bilaterally, no wheezing, rhonchi or rales Abd: soft, nontender, no hepatomegaly, no bruitsOverweight Ext: 2+ BLE edema, 2+ distal pulses Skin: warm and dry GU: deferred Neuro: no focal abnormalities noted, AAO x 3  EKG:  04/28/14-sinus rhythm, left anterior fascicular block, nonspecific ST flattening. QT interval 400 ms. Normal PR interval.z     ASSESSMENT AND PLAN:  1. Near syncope-certainly there are several possibilities which could contribute to sensation of extreme weakness, falls which include potential cardiac arrhythmia such as significant bradycardia which is sometimes associated with increased vagal tone, neural mediated syncope. Usually cardiac arrhythmia such as transient pulses or transient significant bradycardia occur without warning/prodrome and can result in drop attacks. He is not describing this. He also does not describe a prodrome typical of vagal syncope such as diaphoresis, nausea, pallor. Orthostatics have been checked in the past and have been normal. His oncologist did think  that some of his weakness has been associated with prolonged steroid use, myopathy as a possibility. I will check an echocardiogram to ensure proper structure and function of his heart especially in the light of his dyspnea upon minimal exertion. I will also check an event monitor to ensure that he does not have any bradycardia arrhythmias. In all, steroids, chemotherapy, glioblastoma all could be contributing to his current symptoms. 2. Dyspnea-checking echocardiogram 3. Encourage tobacco cessation 4. We will see back in 5 weeks.  Signed, Candee Furbish, MD [facility]  04/28/2014 9:05 AM

## 2014-04-28 NOTE — Patient Instructions (Signed)
The current medical regimen is effective;  continue present plan and medications.  Your physician has requested that you have an echocardiogram. Echocardiography is a painless test that uses sound waves to create images of your heart. It provides your doctor with information about the size and shape of your heart and how well your heart's chambers and valves are working. This procedure takes approximately one hour. There are no restrictions for this procedure.  Your physician has recommended that you wear an event monitor. Event monitors are medical devices that record the heart's electrical activity. Doctors most often [place] these monitors to diagnose arrhythmias. Arrhythmias are problems with the speed or rhythm of the heartbeat. The monitor is a small, portable device. You can wear one while you do your normal daily activities. This is usually used to diagnose what is causing palpitations/syncope (passing out).  Follow up in approximately 5 weeks with Dr Marlou Porch. (after 30 day event monitor)

## 2014-04-29 ENCOUNTER — Ambulatory Visit: Payer: BC Managed Care – PPO

## 2014-04-29 DIAGNOSIS — R5381 Other malaise: Secondary | ICD-10-CM

## 2014-04-29 DIAGNOSIS — R269 Unspecified abnormalities of gait and mobility: Secondary | ICD-10-CM

## 2014-04-29 DIAGNOSIS — R296 Repeated falls: Secondary | ICD-10-CM

## 2014-04-29 DIAGNOSIS — M6281 Muscle weakness (generalized): Secondary | ICD-10-CM

## 2014-04-29 NOTE — Therapy (Signed)
[facility] [address] [place], [place], [address] Phone: [phone]   Fax:  [phone]  Physical Therapy Treatment  Patient Details  Name: Edward [name] MRN: [id number] Date of Birth: 1945/08/26  Encounter Date: 04/29/2014      PT End of Session - 04/29/14 1303    Visit Number 4   Number of Visits 9   PT Start Time 6962   PT Stop Time 1229   PT Time Calculation (min) 41 min   Equipment Utilized During Treatment Gait belt   Activity Tolerance Patient tolerated treatment well   Behavior During Therapy Flat affect      Past Medical History  Diagnosis Date  . HTN (hypertension)   . Hypercholesteremia   . Depression   . Cancer     Renal  . H/O diplopia 01/10/2013  . OSA on CPAP 04/08/2013  . S/P radiation therapy 02/06/2014-03/20/2014    1) Right frontal lobe tumor bed initial / 46 Gy in 23 fractions/ 2) Right frontal lobe tumor bed boost / 14 Gy in 7 fractions      Past Surgical History  Procedure Laterality Date  . Kidney surgery  2009  . Penile prosthesis implant  2011  . Craniotomy N/A 01/01/2014    Procedure: CRANIOTOMY FOR TUMOR EXCISION;  Surgeon: Newman Pies, MD;  Location: [place] [facility];  Service: Neurosurgery;  Laterality: N/A;  Craniotomy for tumor resection with stealth    There were no vitals taken for this visit.  Visit Diagnosis:  Abnormality of gait  Frequent falls  Muscle weakness (generalized)  Debility      Subjective Assessment - 04/29/14 1151    Symptoms Pt denied falls but did report he came close to falling due to lightheadedness.  Pt has not attempted to try laundry since last visit. Pt reported he has been experiencing intermittent L ankle pain upon palpation, pt is not aware of hurting ankle.   Limitations Walking;Standing   Patient Stated Goals Decrease falls, improve balance, walk longer distances without fatigue so he can perform grocery shopping, traverse wooden steps at  home, perform bathtub transfers safely, do laundry at home   Currently in Pain? No/denies            [facility] Adult PT Treatment/Exercise - 04/29/14 1201    Ambulation/Gait   Ambulation/Gait Yes   Ambulation/Gait Assistance 5: Supervision;4: Min guard   Ambulation/Gait Assistance Details Pt ambulated over even/uneven terrain while performing head turns. VC's to improve stride length and weight shifting while performing head turns. No LOB episodes.    Ambulation Distance (Feet) --  345', 75', 50'x2   Balance   Balance Assessed Yes   Dynamic Standing Balance   Dynamic Standing - Balance Support No upper extremity supported   Dynamic Standing - Level of Assistance Other (comment);4: Min assist  min guard   Dynamic Standing - Balance Activities Alternating  foot traps  single cone taps 5 cones x3/LE   Dynamic Standing - Comments Min A required during 3 LOB episodes. VC's to improve lateral weight shifting and to improve eccentric control. Pt required 3 seated rest breaks, due to fatigue and to decrease fear of falling.   Exercises   Exercises Knee/Hip   Knee/Hip Exercises: Aerobic   Stationary Bike Nustep with B LE only, level 2.0, 10 minutes; to improve LE strength and endurance. Distance: 0.49 miles.    Therex: Standing at counter with 1-2 UE support and supervision to ensure safety.  -Hip ext. X10/LE.  VC's to keep L knee extended. -Hip abd. X10/LE. VC's for technique. -Heel/toe raises x20, VC's to improve heel/toe clearance.       PT Education - 04/29/14 1227    Education provided Yes   Education Details Educated pt on improving endurance/strength by using stationary bike with B LE only, at home everyday for 10 minutes, increasing time by 2 minutes as tolerated. Starting a resistance level 2. PT asked pt to attempt one load of laundry before next visit.   Person(s) Educated Patient   Methods Explanation;Verbal cues   Comprehension Verbalized understanding;Returned demonstration             PT Long Term Goals - 04/29/14 1306    PT LONG TERM GOAL #1   Title Pt will verbalize agreement and understanding of falls prevention strategies. (05/14/14).   Status On-going   PT LONG TERM GOAL #2   Title Pt will be independent in HEP in order to improve functional mobility. (05/14/14).   Status On-going   PT LONG TERM GOAL #3   Title Assess BERG and write goal. (05/14/14).   Status Achieved   PT LONG TERM GOAL #4   Title Pt will be able to ascend/descend 14 steps with two handrails, in step over pattern, at MOD I level to improve functional mobility. (05/14/14).   Status On-going   PT LONG TERM GOAL #5   Title Pt will report he is able to run 2 loads of laundry per week, without assistance from his wife, in order to be independent in ADLs. (05/14/14).   Status On-going   PT LONG TERM GOAL #6   Title Pt will be able to perform all tub transfers at MOD I level to improve functional mobility. (05/14/14).   Status On-going   PT LONG TERM GOAL #7   Title Pt will improve Neuro QOL-LE questionnaire by 10 points to improve quality of life. (05/14/14).   Status On-going   PT LONG TERM GOAL #8   Title Assess DGI and write appropriate goal. (05/14/14).   Status Achieved   PT LONG TERM GOAL  #9   TITLE Pt will improve BERG score to >/=49/56 to reduce falls risk. Target date: 05/14/14.   Status On-going   PT LONG TERM GOAL  #10   TITLE Pt will improve DGI score to >/=20/24 to reduce falls risk during ambulation. Target date: 05/14/14.   Status On-going          Plan - 04/29/14 1304    Clinical Impression Statement Pt demonstrated progress, as he required less cues during HEP and did not experience any LOB while ambulating longer distances. Pt continues to require frequent seated rest breaks due to fatigue, and reported "near falls" at home. Pt would continue to benefit from skilled PT to improve safety during functional mobilty.   Pt will benefit from skilled therapeutic  intervention in order to improve on the following deficits Abnormal gait;Decreased endurance;Decreased balance;Decreased mobility;Increased edema;Impaired flexibility;Decreased coordination;Decreased strength;Decreased knowledge of use of DME   Rehab Potential Good   PT Frequency 2x / week   PT Duration 4 weeks   PT Treatment/Interventions ADLs/Self Care Home Management;Gait training;Neuromuscular re-education;Functional mobility training;Patient/family education;Therapeutic activities;Therapeutic exercise;Electrical Stimulation;Manual techniques;Balance training;DME Instruction   PT Next Visit Plan Continue dynamic gait/balance training over uneven/compliant surfaces.   Consulted and Agree with Plan of Care Patient  Problem List Patient Active Problem List   Diagnosis Date Noted  . Glioblastoma 01/08/2014  . Brain tumor 01/01/2014  . Lung mass 12/30/2013  . Tobacco use disorder 12/30/2013  . Brain mass 12/29/2013  . Benign essential HTN 12/29/2013  . Hyperlipidemia 12/29/2013  . Vasogenic cerebral edema 12/29/2013  . ARF (acute renal failure) 12/29/2013  . Hx of renal cell cancer 12/29/2013  . OSA on CPAP 04/08/2013  . H/O diplopia 01/10/2013    Jaquitta Dupriest L 04/29/2014, 1:07 PM    Geoffry Paradise, PT,DPT 04/29/2014 1:07 PM Phone: [phone] Fax: [phone]

## 2014-05-01 ENCOUNTER — Ambulatory Visit: Payer: BC Managed Care – PPO

## 2014-05-01 ENCOUNTER — Other Ambulatory Visit (HOSPITAL_COMMUNITY): Payer: BC Managed Care – PPO

## 2014-05-02 ENCOUNTER — Encounter (INDEPENDENT_AMBULATORY_CARE_PROVIDER_SITE_OTHER): Payer: BC Managed Care – PPO

## 2014-05-02 ENCOUNTER — Institutional Professional Consult (permissible substitution): Payer: BC Managed Care – PPO | Admitting: Cardiology

## 2014-05-02 ENCOUNTER — Ambulatory Visit: Payer: BC Managed Care – PPO

## 2014-05-02 ENCOUNTER — Encounter: Payer: Self-pay | Admitting: Radiology

## 2014-05-02 ENCOUNTER — Ambulatory Visit (HOSPITAL_COMMUNITY): Payer: BC Managed Care – PPO | Attending: Cardiovascular Disease | Admitting: Radiology

## 2014-05-02 DIAGNOSIS — R55 Syncope and collapse: Secondary | ICD-10-CM

## 2014-05-02 NOTE — Progress Notes (Signed)
Echocardiogram performed.  

## 2014-05-02 NOTE — Progress Notes (Signed)
Pre[employer]ice 30 day monitor applied. EOS 06-02-14

## 2014-05-06 ENCOUNTER — Ambulatory Visit: Payer: BC Managed Care – PPO

## 2014-05-06 DIAGNOSIS — R269 Unspecified abnormalities of gait and mobility: Secondary | ICD-10-CM

## 2014-05-06 DIAGNOSIS — M6281 Muscle weakness (generalized): Secondary | ICD-10-CM

## 2014-05-06 DIAGNOSIS — R296 Repeated falls: Secondary | ICD-10-CM

## 2014-05-06 DIAGNOSIS — R5381 Other malaise: Secondary | ICD-10-CM

## 2014-05-06 NOTE — Therapy (Signed)
[facility] [address] [place], [place], [address] Phone: [phone]   Fax:  [phone]  Physical Therapy Treatment  Patient Details  Name: Edward [name] MRN: [id number] Date of Birth: 1946/02/16  Encounter Date: 05/06/2014      PT End of Session - 05/06/14 1153    Visit Number 5   Number of Visits 9   PT Start Time 1107   PT Stop Time 1856   PT Time Calculation (min) 38 min   Equipment Utilized During Treatment Gait belt   Activity Tolerance Patient limited by fatigue  limited by SOB and decreased SaO2.   Behavior During Therapy [facility] for tasks assessed/performed      Past Medical History  Diagnosis Date  . HTN (hypertension)   . Hypercholesteremia   . Depression   . Cancer     Renal  . H/O diplopia 01/10/2013  . OSA on CPAP 04/08/2013  . S/P radiation therapy 02/06/2014-03/20/2014    1) Right frontal lobe tumor bed initial / 46 Gy in 23 fractions/ 2) Right frontal lobe tumor bed boost / 14 Gy in 7 fractions      Past Surgical History  Procedure Laterality Date  . Kidney surgery  2009  . Penile prosthesis implant  2011  . Craniotomy N/A 01/01/2014    Procedure: CRANIOTOMY FOR TUMOR EXCISION;  Surgeon: Newman Pies, MD;  Location: [place] [facility];  Service: Neurosurgery;  Laterality: N/A;  Craniotomy for tumor resection with stealth    There were no vitals taken for this visit.  Visit Diagnosis:  Abnormality of gait  Frequent falls  Muscle weakness (generalized)  Debility      Subjective Assessment - 05/06/14 1110    Symptoms Pt denied falls since last visit. Pt reported he has not been able to perform exercises due to weakness and lightheadedness. Pt reported he is unable to perform bike activities due to heart monitor (pt will wear monitor until 06/02/14).   Limitations Walking;Standing   Patient Stated Goals Decrease falls, improve balance, walk longer distances without fatigue so he can  perform grocery shopping, traverse wooden steps at home, perform bathtub transfers safely, do laundry at home   Currently in Pain? No/denies                    [facility] Adult PT Treatment/Exercise - 05/06/14 1112    Transfers   Transfers Lateral/Scoot Transfers   Sit to Stand --   Lateral/Scoot Transfers 4: Min guard;5: Supervision   Lateral/Scoot Transfer Details (indicate cue type and reason) x4: pt performed lateral transfer from chair without arm rests to mat to simulate tub transfer. Pt progressed from min guard to supervision. VC's for technique and to bring nose towards opposite knee in order to lift buttocks.   Ambulation/Gait   Ambulation/Gait Yes   Ambulation/Gait Assistance 5: Supervision   Ambulation/Gait Assistance Details Pt ambulated over even terrain while performing head turn. VC's to improve L step length and L heel strike, while performing head turns. SaO2 decreased to 89-90% after ambulating, pt required seated rest break after each bout of ambulation to improve SaO2 on room air to >92% and to decrease SOB.   Ambulation Distance (Feet) --  350', 75'x2, 300'   Assistive device None   Gait Pattern Step-through pattern;Decreased dorsiflexion - left;Decreased trunk rotation   Stairs Yes   Stairs Assistance 4: Min guard;5: Supervision  progressed to supervision.   Stairs Assistance Details (indicate cue type and  reason) Cues to improve ant. weight shifting. Pt required 2, 30 seconds, standing rest breaks due to fatigue. Pt's SaO2 on RA 89% after traversing stairs, but quickly incr. to >93% with seated rest break and pursed lip breathing. Pt reported he was SOB after traversing stairs.    Stair Management Technique One rail Right;Alternating pattern   Number of Stairs 20   Height of Stairs 6   Balance   Balance Assessed --                PT Education - 05/06/14 1151    Education provided Yes   Education Details PT educated pt on the importance of taking  rest breaks when he is SOB, and to perform pursed lip breathing to improve SaO2 and decrease SOB. PT also encourage pt to tell his MD about SOB and decrease in SaO2 after ambulating.   Person(s) Educated Patient   Methods Explanation;Handout;Verbal cues   Comprehension Verbalized understanding          PT Short Term Goals - 04/16/14 1612    PT SHORT TERM GOAL #1   Title --   Status --   PT SHORT TERM GOAL #2   Title --   Status --   PT SHORT TERM GOAL #3   Title --   Status --   PT SHORT TERM GOAL #4   Title --   Status --   PT SHORT TERM GOAL #5   Title --   Status --           PT Long Term Goals - 04/29/14 1306    PT LONG TERM GOAL #1   Title Pt will verbalize agreement and understanding of falls prevention strategies. (05/14/14).   Status On-going   PT LONG TERM GOAL #2   Title Pt will be independent in HEP in order to improve functional mobility. (05/14/14).   Status On-going   PT LONG TERM GOAL #3   Title Assess BERG and write goal. (05/14/14).   Status Achieved   PT LONG TERM GOAL #4   Title Pt will be able to ascend/descend 14 steps with two handrails, in step over pattern, at MOD I level to improve functional mobility. (05/14/14).   Status On-going   PT LONG TERM GOAL #5   Title Pt will report he is able to run 2 loads of laundry per week, without assistance from his wife, in order to be independent in ADLs. (05/14/14).   Status On-going   PT LONG TERM GOAL #6   Title Pt will be able to perform all tub transfers at MOD I level to improve functional mobility. (05/14/14).   Status On-going   PT LONG TERM GOAL #7   Title Pt will improve Neuro QOL-LE questionnaire by 10 points to improve quality of life. (05/14/14).   Status On-going   PT LONG TERM GOAL #8   Title Assess DGI and write appropriate goal. (05/14/14).   Status Achieved   PT LONG TERM GOAL  #9   TITLE Pt will improve BERG score to >/=49/56 to reduce falls risk. Target date: 05/14/14.   Status  On-going   PT LONG TERM GOAL  #10   TITLE Pt will improve DGI score to >/=20/24 to reduce falls risk during ambulation. Target date: 05/14/14.   Status On-going               Plan - 05/06/14 1153    Clinical Impression Statement Pt limited today by SOB during ambulation,  and required seated rest breaks to imporve SaO2 on room air to >93% as Sao2 decreased to 89-90% after ambulating and traversing stairs. PT strongly encouraged pt to inform MD and gave pt a handout with this session's information regarding SaO2, as he sees his oncologist tomorrow. Continue with POC.   Pt will benefit from skilled therapeutic intervention in order to improve on the following deficits Abnormal gait;Decreased endurance;Decreased balance;Decreased mobility;Increased edema;Impaired flexibility;Decreased coordination;Decreased strength;Decreased knowledge of use of DME   Rehab Potential Good   PT Frequency 2x / week   PT Duration 4 weeks   PT Treatment/Interventions ADLs/Self Care Home Management;Gait training;Neuromuscular re-education;Functional mobility training;Patient/family education;Therapeutic activities;Therapeutic exercise;Electrical Stimulation;Manual techniques;Balance training;DME Instruction   PT Next Visit Plan Continue dynamic gait/balance training over uneven/compliant surfaces and assess SaO2.   Consulted and Agree with Plan of Care Patient        Problem List Patient Active Problem List   Diagnosis Date Noted  . Glioblastoma 01/08/2014  . Brain tumor 01/01/2014  . Lung mass 12/30/2013  . Tobacco use disorder 12/30/2013  . Brain mass 12/29/2013  . Benign essential HTN 12/29/2013  . Hyperlipidemia 12/29/2013  . Vasogenic cerebral edema 12/29/2013  . ARF (acute renal failure) 12/29/2013  . Hx of renal cell cancer 12/29/2013  . OSA on CPAP 04/08/2013  . H/O diplopia 01/10/2013    Kaulder Zahner L 05/06/2014, 12:08 PM  [facility] [address] [place], [place], [address] Phone: [phone]   Fax:  [phone]     Geoffry Paradise, PT,DPT 05/06/2014 12:08 PM Phone: [phone] Fax: [phone]

## 2014-05-07 ENCOUNTER — Ambulatory Visit (HOSPITAL_BASED_OUTPATIENT_CLINIC_OR_DEPARTMENT_OTHER): Payer: BC Managed Care – PPO | Admitting: Hematology & Oncology

## 2014-05-07 ENCOUNTER — Encounter: Payer: Self-pay | Admitting: Hematology & Oncology

## 2014-05-07 ENCOUNTER — Ambulatory Visit: Payer: BC Managed Care – PPO | Admitting: Physical Therapy

## 2014-05-07 ENCOUNTER — Ambulatory Visit (HOSPITAL_BASED_OUTPATIENT_CLINIC_OR_DEPARTMENT_OTHER)
Admission: RE | Admit: 2014-05-07 | Discharge: 2014-05-07 | Disposition: A | Discharge status: Home / Self Care | Payer: BC Managed Care – PPO | Source: Ambulatory Visit | Attending: Hematology & Oncology | Admitting: Hematology & Oncology

## 2014-05-07 ENCOUNTER — Ambulatory Visit (HOSPITAL_BASED_OUTPATIENT_CLINIC_OR_DEPARTMENT_OTHER): Payer: BC Managed Care – PPO | Admitting: Lab

## 2014-05-07 ENCOUNTER — Telehealth: Payer: Self-pay | Admitting: Pharmacist

## 2014-05-07 VITALS — BP 142/76 | HR 66 | Temp 98.2°F | Resp 18 | Wt 232.0 lb

## 2014-05-07 DIAGNOSIS — M7989 Other specified soft tissue disorders: Secondary | ICD-10-CM

## 2014-05-07 DIAGNOSIS — I82431 Acute embolism and thrombosis of right popliteal vein: Secondary | ICD-10-CM | POA: Insufficient documentation

## 2014-05-07 DIAGNOSIS — C711 Malignant neoplasm of frontal lobe: Secondary | ICD-10-CM

## 2014-05-07 DIAGNOSIS — I82401 Acute embolism and thrombosis of unspecified deep veins of right lower extremity: Secondary | ICD-10-CM

## 2014-05-07 LAB — CMP (CANCER CENTER ONLY)
ALT: 36 U/L (ref 10–47)
AST: 25 U/L (ref 11–38)
Albumin: 3.1 g/dL — ABNORMAL LOW (ref 3.3–5.5)
Alkaline Phosphatase: 76 U/L (ref 26–84)
BILIRUBIN TOTAL: 0.8 mg/dL (ref 0.20–1.60)
BUN: 25 mg/dL — AB (ref 7–22)
CALCIUM: 8.9 mg/dL (ref 8.0–10.3)
CHLORIDE: 101 meq/L (ref 98–108)
CO2: 28 meq/L (ref 18–33)
CREATININE: 2.2 mg/dL — AB (ref 0.6–1.2)
Glucose, Bld: 87 mg/dL (ref 73–118)
Potassium: 3.5 mEq/L (ref 3.3–4.7)
SODIUM: 142 meq/L (ref 128–145)
Total Protein: 6.4 g/dL (ref 6.4–8.1)

## 2014-05-07 LAB — CBC WITH DIFFERENTIAL (CANCER CENTER ONLY)
BASO#: 0.1 10*3/uL (ref 0.0–0.2)
BASO%: 0.5 % (ref 0.0–2.0)
EOS%: 1 % (ref 0.0–7.0)
Eosinophils Absolute: 0.1 10*3/uL (ref 0.0–0.5)
HCT: 38.1 % — ABNORMAL LOW (ref 38.7–49.9)
HEMOGLOBIN: 12.5 g/dL — AB (ref 13.0–17.1)
LYMPH#: 2.1 10*3/uL (ref 0.9–3.3)
LYMPH%: 15.7 % (ref 14.0–48.0)
MCH: 30.9 pg (ref 28.0–33.4)
MCHC: 32.8 g/dL (ref 32.0–35.9)
MCV: 94 fL (ref 82–98)
MONO#: 1.1 10*3/uL — AB (ref 0.1–0.9)
MONO%: 8.1 % (ref 0.0–13.0)
NEUT%: 74.7 % (ref 40.0–80.0)
NEUTROS ABS: 9.7 10*3/uL — AB (ref 1.5–6.5)
Platelets: 187 10*3/uL (ref 145–400)
RBC: 4.04 10*6/uL — AB (ref 4.20–5.70)
RDW: 18.5 % — ABNORMAL HIGH (ref 11.1–15.7)
WBC: 13 10*3/uL — AB (ref 4.0–10.0)

## 2014-05-07 LAB — TECHNOLOGIST REVIEW CHCC SATELLITE

## 2014-05-07 LAB — LACTATE DEHYDROGENASE: LDH: 361 U/L — ABNORMAL HIGH (ref 94–250)

## 2014-05-07 MED ORDER — TEMOZOLOMIDE 180 MG PO CAPS: ORAL_CAPSULE | ORAL | Status: DC

## 2014-05-07 MED ORDER — TEMOZOLOMIDE 250 MG PO CAPS: ORAL_CAPSULE | ORAL | Status: DC

## 2014-05-07 MED ORDER — RIVAROXABAN 20 MG PO TABS: 20.0000 mg | ORAL_TABLET | Freq: Every day | ORAL | Status: DC

## 2014-05-07 MED ORDER — METOLAZONE 2.5 MG PO TABS: 2.5000 mg | ORAL_TABLET | Freq: Every day | ORAL | Status: DC

## 2014-05-07 NOTE — Patient Instructions (Signed)
Take lasix 60mg  once a day. Decrease decadron to 2mg  daily for five days and then stop.  Take zaroxlyn one hour before lasix. Double the potassium dose.

## 2014-05-07 NOTE — Telephone Encounter (Signed)
Medication samples given:  Xarelto 15 mg #28  Lot:  [id number]   Exp:  11/13/15

## 2014-05-07 NOTE — Addendum Note (Signed)
Addended by: Burney Gauze R on: 05/07/2014 05:23 PM   Modules accepted: Level of Service

## 2014-05-07 NOTE — Progress Notes (Signed)
Hematology and Oncology Follow Up Visit  Edward [name] [id number] August 27, 1945 68 y.o. 05/07/2014   Principle Diagnosis:   Right frontal glioblastoma multiforme  DVT of the right lower leg  History of localized renal cell carcinoma of the left kidney  Current Therapy:    Status post 1 cycle of full dose Temodar  Patient to start Xarelto     Interim History:  Edward [name] is back for follow-up. He still is in a wheelchair. He is doing some physical therapy.  He still is on Decadron. We will try to taper him down in the next 10 days.  He has swelling in his legs. There is a little bit more swelling in the right leg.  I got a Doppler of his legs. unfortunately, he does have a thrombus in the the right popliteal vein. The left leg is okay.  It should be no surprise about the thrombo-embolic disease given that he has not been all that active and he does have the glioblastoma.  He does have some compromised renal function. However, we calculated his creatinine clearance and it was about 50 mL/m. As such, we can use regular dose Xarelto. I think this be safe for him.  He's had no nausea or vomiting. He has had no diarrhea. There is some slight constipation. He has had no cough. There is no chest wall pain. He has had no headache. There is no visual difficulty. He's had no mouth sores.  His biggest problem continues to be the myopathy. I have to believe that this is from the steroids that he is on.  His appetite is doing okay.  Overall, his performance status is ECOG 2.  He starts his next cycle of Temodar on January 11. He goes out to [facility] for another appointment and MRI on January 19.      Medications: Current outpatient prescriptions: allopurinol (ZYLOPRIM) 100 MG tablet, Take by mouth., Disp: , Rfl: ;  atorvastatin (LIPITOR) 40 MG tablet, Take by mouth., Disp: , Rfl: ;  buPROPion (WELLBUTRIN XL) 300 MG 24 hr tablet, Take 300 mg by mouth daily., Disp: , Rfl: ;   citalopram (CELEXA) 40 MG tablet, Take 1 tablet (40 mg total) by mouth daily., Disp: 30 tablet, Rfl: 2 dexamethasone (DECADRON) 2 MG tablet, Take 2 mg by mouth 2 (two) times daily with a meal., Disp: , Rfl: ;  furosemide (LASIX) 40 MG tablet, Take by mouth. 40 mg in the am and 20 mg po in the afternoon, Disp: , Rfl: ;  KLOR-CON M20 20 MEQ tablet, Take 20 mEq by mouth once. , Disp: , Rfl: ;  metoprolol succinate (TOPROL-XL) 100 MG 24 hr tablet, Take 100 mg by mouth daily. , Disp: , Rfl:  Multiple Vitamins-Minerals (MULTIVITAMIN PO), Take 1 tablet by mouth daily., Disp: , Rfl: ;  omeprazole (PRILOSEC) 40 MG capsule, Take 40 mg by mouth daily. , Disp: , Rfl: ;  ondansetron (ZOFRAN) 8 MG tablet, Take 1 pill 84min prior to Temodar, Disp: 30 tablet, Rfl: 3;  temozolomide (TEMODAR) 180 MG capsule, Take 1 pill a day for 5 days at bedtime., Disp: 5 capsule, Rfl: 11 temozolomide (TEMODAR) 250 MG capsule, Take 1 pill a day for 5 days at bedtime, Disp: 5 capsule, Rfl: 11;  zolpidem (AMBIEN) 10 MG tablet, Take 10 mg by mouth at bedtime. , Disp: , Rfl: ;  metolazone (ZAROXOLYN) 2.5 MG tablet, Take 1 tablet (2.5 mg total) by mouth daily., Disp: 30 tablet, Rfl: 4;  rivaroxaban (  XARELTO) 20 MG TABS tablet, Take 1 tablet (20 mg total) by mouth daily with supper., Disp: 30 tablet, Rfl: 12  Allergies:  Allergies  Allergen Reactions  . Nsaids Other (See Comments)    Not good for kidney    Past Medical History, Surgical history, Social history, and Family History were reviewed and updated.  Review of Systems: As above  Physical Exam:  weight is 232 lb (105.235 kg). His oral temperature is 98.2 F (36.8 C). His blood pressure is 142/76 and his pulse is 66. His respiration is 18.   Cushingoid appearing white male. He is in no distress. He is alert and oriented. Head and neck exam shows no adenopathy. He has no ocular or oral lesions. Pupils react appropriate leg. Lungs are clear. Cardiac exam regular rate and rhythm  with no murmurs, rubs or bruits. Abdomen is soft, he is moderately obese. There is no fluid wave. There is no palpable liver or spleen tip. Extremities shows 1+ edema in his lower legs. There may be some slight increase in edema in the right leg. No palpable venous cord is noted. He has good range of motion of his joints. He has 4/5 strength in his lower legs. He has 3/5 strength in his thighs. Neurological exam shows no focal neurological deficits. Back exam shows no tenderness over the spine, ribs or hips. Skin exam shows no rashes, or ecchymosis or petechia.  Lab Results  Component Value Date   WBC 13.0* 05/07/2014   HGB 12.5* 05/07/2014   HCT 38.1* 05/07/2014   MCV 94 05/07/2014   PLT 187 05/07/2014     Chemistry      Component Value Date/Time   NA 142 05/07/2014 1116   NA 138 02/28/2014 1048   NA 140 01/28/2014 0801   K 3.5 05/07/2014 1116   K 4.6 02/28/2014 1048   K 4.7 01/28/2014 0801   CL 101 05/07/2014 1116   CL 106 01/28/2014 0801   CO2 28 05/07/2014 1116   CO2 22 02/28/2014 1048   CO2 28 01/28/2014 0801   BUN 25* 05/07/2014 1116   BUN 53.2* 02/28/2014 1048   BUN 31* 01/28/2014 0801   CREATININE 2.2* 05/07/2014 1116   CREATININE 2.5* 02/28/2014 1048   CREATININE 1.91* 01/28/2014 0801      Component Value Date/Time   CALCIUM 8.9 05/07/2014 1116   CALCIUM 9.2 02/28/2014 1048   CALCIUM 8.7 01/28/2014 0801   ALKPHOS 76 05/07/2014 1116   ALKPHOS 106 02/28/2014 1048   ALKPHOS 78 01/28/2014 0801   AST 25 05/07/2014 1116   AST 45* 02/28/2014 1048   AST 14 01/28/2014 0801   ALT 36 05/07/2014 1116   ALT 118* 02/28/2014 1048   ALT 30 01/28/2014 0801   BILITOT 0.80 05/07/2014 1116   BILITOT 0.35 02/28/2014 1048   BILITOT 0.4 01/28/2014 0801         Impression and Plan: Edward [name] is 68 year old gentleman with a partially resected glioblastoma of the right frontal lobe.  Of note, his tumor is not MGMT methylated. This typically indicates a more aggressive type  tumor.  I'm just disappointed that he has had the thrombus. Again, I think Xarelto would be helpful. I probably would keep him on Xarelto for a year.  I talked to he and his wife regarding the thrombus. They understand the situation.  I'm glad that the recent MRI did show that he was responding.  Again, I spent about an hour with he and his wife. Mostly,  we were trying to deal with his physical limitations and the fact that he has his new thrombus in the   I will plan to see him back in about 3 weeks.  Hopefully, by then, he will be able to walk.   Volanda Napoleon, MD 12/23/20155:16 PM

## 2014-05-19 ENCOUNTER — Emergency Department (HOSPITAL_COMMUNITY)
Admission: EM | Admit: 2014-05-19 | Discharge: 2014-05-19 | Disposition: A | Discharge status: Home / Self Care | Payer: BC Managed Care – PPO | Attending: Emergency Medicine | Admitting: Emergency Medicine

## 2014-05-19 ENCOUNTER — Emergency Department (HOSPITAL_COMMUNITY): Payer: BC Managed Care – PPO

## 2014-05-19 ENCOUNTER — Encounter (HOSPITAL_COMMUNITY): Payer: Self-pay | Admitting: *Deleted

## 2014-05-19 DIAGNOSIS — Y998 Other external cause status: Secondary | ICD-10-CM | POA: Diagnosis not present

## 2014-05-19 DIAGNOSIS — Y9389 Activity, other specified: Secondary | ICD-10-CM | POA: Insufficient documentation

## 2014-05-19 DIAGNOSIS — E780 Pure hypercholesterolemia: Secondary | ICD-10-CM | POA: Diagnosis not present

## 2014-05-19 DIAGNOSIS — I10 Essential (primary) hypertension: Secondary | ICD-10-CM | POA: Insufficient documentation

## 2014-05-19 DIAGNOSIS — Z86718 Personal history of other venous thrombosis and embolism: Secondary | ICD-10-CM | POA: Diagnosis not present

## 2014-05-19 DIAGNOSIS — G4733 Obstructive sleep apnea (adult) (pediatric): Secondary | ICD-10-CM | POA: Diagnosis not present

## 2014-05-19 DIAGNOSIS — R55 Syncope and collapse: Secondary | ICD-10-CM | POA: Diagnosis not present

## 2014-05-19 DIAGNOSIS — Z7952 Long term (current) use of systemic steroids: Secondary | ICD-10-CM | POA: Diagnosis not present

## 2014-05-19 DIAGNOSIS — R0602 Shortness of breath: Secondary | ICD-10-CM | POA: Diagnosis not present

## 2014-05-19 DIAGNOSIS — Z043 Encounter for examination and observation following other accident: Secondary | ICD-10-CM | POA: Diagnosis not present

## 2014-05-19 DIAGNOSIS — F329 Major depressive disorder, single episode, unspecified: Secondary | ICD-10-CM | POA: Insufficient documentation

## 2014-05-19 DIAGNOSIS — Z5189 Encounter for other specified aftercare: Secondary | ICD-10-CM | POA: Diagnosis not present

## 2014-05-19 DIAGNOSIS — Z720 Tobacco use: Secondary | ICD-10-CM | POA: Diagnosis not present

## 2014-05-19 DIAGNOSIS — R0902 Hypoxemia: Secondary | ICD-10-CM | POA: Insufficient documentation

## 2014-05-19 DIAGNOSIS — Y92511 Restaurant or cafe as the place of occurrence of the external cause: Secondary | ICD-10-CM | POA: Diagnosis not present

## 2014-05-19 DIAGNOSIS — Z85528 Personal history of other malignant neoplasm of kidney: Secondary | ICD-10-CM | POA: Insufficient documentation

## 2014-05-19 DIAGNOSIS — Z87898 Personal history of other specified conditions: Secondary | ICD-10-CM

## 2014-05-19 DIAGNOSIS — Z85841 Personal history of malignant neoplasm of brain: Secondary | ICD-10-CM | POA: Diagnosis not present

## 2014-05-19 DIAGNOSIS — Z9981 Dependence on supplemental oxygen: Secondary | ICD-10-CM | POA: Insufficient documentation

## 2014-05-19 DIAGNOSIS — R531 Weakness: Secondary | ICD-10-CM | POA: Diagnosis present

## 2014-05-19 DIAGNOSIS — W1839XA Other fall on same level, initial encounter: Secondary | ICD-10-CM | POA: Diagnosis not present

## 2014-05-19 LAB — CBC WITH DIFFERENTIAL/PLATELET
Basophils Absolute: 0 10*3/uL (ref 0.0–0.1)
Basophils Relative: 0 % (ref 0–1)
EOS ABS: 0.1 10*3/uL (ref 0.0–0.7)
Eosinophils Relative: 1 % (ref 0–5)
HEMATOCRIT: 36.1 % — AB (ref 39.0–52.0)
Hemoglobin: 11.5 g/dL — ABNORMAL LOW (ref 13.0–17.0)
LYMPHS ABS: 1.2 10*3/uL (ref 0.7–4.0)
Lymphocytes Relative: 10 % — ABNORMAL LOW (ref 12–46)
MCH: 30.4 pg (ref 26.0–34.0)
MCHC: 31.9 g/dL (ref 30.0–36.0)
MCV: 95.5 fL (ref 78.0–100.0)
Monocytes Absolute: 0.7 10*3/uL (ref 0.1–1.0)
Monocytes Relative: 6 % (ref 3–12)
NEUTROS PCT: 83 % — AB (ref 43–77)
Neutro Abs: 10.3 10*3/uL — ABNORMAL HIGH (ref 1.7–7.7)
Platelets: 223 10*3/uL (ref 150–400)
RBC: 3.78 MIL/uL — ABNORMAL LOW (ref 4.22–5.81)
RDW: 18.1 % — ABNORMAL HIGH (ref 11.5–15.5)
WBC: 12.4 10*3/uL — ABNORMAL HIGH (ref 4.0–10.5)

## 2014-05-19 LAB — COMPREHENSIVE METABOLIC PANEL
ALBUMIN: 3.3 g/dL — AB (ref 3.5–5.2)
ALT: 46 U/L (ref 0–53)
AST: 38 U/L — ABNORMAL HIGH (ref 0–37)
Alkaline Phosphatase: 93 U/L (ref 39–117)
Anion gap: 9 (ref 5–15)
BILIRUBIN TOTAL: 0.4 mg/dL (ref 0.3–1.2)
BUN: 24 mg/dL — AB (ref 6–23)
CO2: 25 mmol/L (ref 19–32)
Calcium: 9 mg/dL (ref 8.4–10.5)
Chloride: 105 mEq/L (ref 96–112)
Creatinine, Ser: 2.77 mg/dL — ABNORMAL HIGH (ref 0.50–1.35)
GFR calc Af Amer: 26 mL/min — ABNORMAL LOW (ref >90)
GFR calc non Af Amer: 22 mL/min — ABNORMAL LOW (ref >90)
Glucose, Bld: 131 mg/dL — ABNORMAL HIGH (ref 70–99)
Potassium: 4.1 mmol/L (ref 3.5–5.1)
Sodium: 139 mmol/L (ref 135–145)
TOTAL PROTEIN: 6.4 g/dL (ref 6.0–8.3)

## 2014-05-19 LAB — I-STAT CHEM 8, ED
BUN: 33 mg/dL — ABNORMAL HIGH (ref 6–23)
CREATININE: 2.5 mg/dL — AB (ref 0.50–1.35)
Calcium, Ion: 1.22 mmol/L (ref 1.13–1.30)
Chloride: 103 mEq/L (ref 96–112)
Glucose, Bld: 122 mg/dL — ABNORMAL HIGH (ref 70–99)
HEMATOCRIT: 38 % — AB (ref 39.0–52.0)
HEMOGLOBIN: 12.9 g/dL — AB (ref 13.0–17.0)
POTASSIUM: 4.3 mmol/L (ref 3.5–5.1)
SODIUM: 141 mmol/L (ref 135–145)
TCO2: 24 mmol/L (ref 0–100)

## 2014-05-19 MED ORDER — ACETAMINOPHEN 500 MG PO TABS
1000.0000 mg | ORAL_TABLET | Freq: Once | ORAL | Status: AC
  Administered 2014-05-19: 1000 mg via ORAL
  Filled 2014-05-19: qty 2

## 2014-05-19 MED ORDER — MORPHINE SULFATE 4 MG/ML IJ SOLN
4.0000 mg | Freq: Once | INTRAMUSCULAR | Status: AC
  Administered 2014-05-19: 4 mg via INTRAMUSCULAR
  Filled 2014-05-19: qty 1

## 2014-05-19 NOTE — ED Provider Notes (Signed)
CSN: [id number]     Arrival date & time 05/19/14  1215 History   First MD Initiated Contact with Patient 05/19/14 1216     Chief Complaint  Patient presents with  . Weakness  . Fall  . Shortness of Breath     (Consider location/radiation/quality/duration/timing/severity/associated sxs/prior Treatment) HPI Comments: Patient is a 69 year old male with history of brain tumor treated with craniotomy, chemotherapy, and radiation in 2015. He was diagnosed 10 days ago with a DVT in his right leg. He was started on Xarelto for this. Today he went to eat at a restaurant when he states that he fell but denies hitting his head. He was with his daughter at the time who did not witness any seizure activity or head trauma. He then went to her workplace where he apparently became unresponsive with snoring respirations. He was waken up approximately 10 minutes later and brought by EMS. He has since waken up and is awake, alert, and appropriate. He has little recollection of these episodes.  In speaking with his wife, she states that he has had intermittent dizziness and difficulty ambulating since the surgery to remove the tumor.  Patient is a 69 y.o. male presenting with syncope. The history is provided by the patient.  Loss of Consciousness Episode history:  Single Timing:  Constant Progression:  Resolved Chronicity:  New Relieved by:  Nothing Worsened by:  Nothing tried Ineffective treatments:  None tried   Past Medical History  Diagnosis Date  . HTN (hypertension)   . Hypercholesteremia   . Depression   . Cancer     Renal  . H/O diplopia 01/10/2013  . OSA on CPAP 04/08/2013  . S/P radiation therapy 02/06/2014-03/20/2014    1) Right frontal lobe tumor bed initial / 46 Gy in 23 fractions/ 2) Right frontal lobe tumor bed boost / 14 Gy in 7 fractions     Past Surgical History  Procedure Laterality Date  . Kidney surgery  2009  . Penile prosthesis implant  2011  . Craniotomy N/A 01/01/2014   Procedure: CRANIOTOMY FOR TUMOR EXCISION;  Surgeon: Newman Pies, MD;  Location: [facility];  Service: Neurosurgery;  Laterality: N/A;  Craniotomy for tumor resection with stealth   Family History  Problem Relation Age of Onset  . Cancer Mother   . Heart failure Father    History  Substance Use Topics  . Smoking status: Current Every Day Smoker -- 1.00 packs/day for 53 years    Types: Cigarettes    Start date: 08/28/1960  . Smokeless tobacco: Never Used     Comment: still smoking , 6 a day 03/10/14  . Alcohol Use: No    Review of Systems  Cardiovascular: Positive for syncope.  All other systems reviewed and are negative.     Allergies  Nsaids  Home Medications   Prior to Admission medications   Medication Sig Start Date End Date Taking? Authorizing Provider  allopurinol (ZYLOPRIM) 100 MG tablet Take by mouth. 11/15/12   Historical Provider, MD  atorvastatin (LIPITOR) 40 MG tablet Take by mouth. 12/20/12   Historical Provider, MD  buPROPion (WELLBUTRIN XL) 300 MG 24 hr tablet Take 300 mg by mouth daily.    Historical Provider, MD  citalopram (CELEXA) 40 MG tablet Take 1 tablet (40 mg total) by mouth daily. 01/06/14   Reyne Dumas, MD  dexamethasone (DECADRON) 2 MG tablet Take 2 mg by mouth 2 (two) times daily with a meal.    Historical Provider, MD  furosemide (  LASIX) 40 MG tablet Take by mouth. 40 mg in the am and 20 mg po in the afternoon    Historical Provider, MD  KLOR-CON M20 20 MEQ tablet Take 20 mEq by mouth once.  11/25/13   Historical Provider, MD  metolazone (ZAROXOLYN) 2.5 MG tablet Take 1 tablet (2.5 mg total) by mouth daily. 05/07/14   Volanda Napoleon, MD  metoprolol succinate (TOPROL-XL) 100 MG 24 hr tablet Take 100 mg by mouth daily.  12/10/12   Historical Provider, MD  Multiple Vitamins-Minerals (MULTIVITAMIN PO) Take 1 tablet by mouth daily.    Historical Provider, MD  omeprazole (PRILOSEC) 40 MG capsule Take 40 mg by mouth daily.  10/14/12   Historical Provider,  MD  ondansetron (ZOFRAN) 8 MG tablet Take 1 pill 85min prior to Temodar 01/28/14   Volanda Napoleon, MD  rivaroxaban (XARELTO) 20 MG TABS tablet Take 1 tablet (20 mg total) by mouth daily with supper. 05/07/14   Volanda Napoleon, MD  temozolomide (TEMODAR) 180 MG capsule Take 1 pill a day for 5 days at bedtime. 05/07/14   Volanda Napoleon, MD  temozolomide (TEMODAR) 250 MG capsule Take 1 pill a day for 5 days at bedtime 05/07/14   Volanda Napoleon, MD  zolpidem (AMBIEN) 10 MG tablet Take 10 mg by mouth at bedtime.  12/17/12   Historical Provider, MD   BP 106/71 mmHg  Pulse 78  Temp(Src) 98.5 F (36.9 C) (Oral)  Resp 18  Ht 6\' 1"  (1.854 m)  Wt 233 lb (105.688 kg)  BMI 30.75 kg/m2  SpO2 100% Physical Exam  Constitutional: He is oriented to person, place, and time. He appears well-developed and well-nourished. No distress.  HENT:  Head: Normocephalic and atraumatic.  Mouth/Throat: Oropharynx is clear and moist.  Eyes: EOM are normal. Pupils are equal, round, and reactive to light.  Neck: Normal range of motion. Neck supple.  Cardiovascular: Normal rate, regular rhythm and normal heart sounds.   No murmur heard. Pulmonary/Chest: Effort normal and breath sounds normal. No respiratory distress. He has no wheezes.  Abdominal: Soft. Bowel sounds are normal. He exhibits no distension. There is no tenderness.  Musculoskeletal: Normal range of motion. He exhibits no edema.  Lymphadenopathy:    He has no cervical adenopathy.  Neurological: He is alert and oriented to person, place, and time.  Skin: Skin is warm and dry. He is not diaphoretic.  Nursing note and vitals reviewed.   ED Course  Procedures (including critical care time) Labs Review Labs Reviewed  COMPREHENSIVE METABOLIC PANEL  CBC WITH DIFFERENTIAL    Imaging Review No results found.   EKG Interpretation   Date/Time:  Monday May 19 2014 12:27:13 EST Ventricular Rate:  82 PR Interval:  165 QRS Duration: 101 QT  Interval:  375 QTC Calculation: 438 R Axis:   -56 Text Interpretation:  Sinus rhythm Left anterior fascicular block  Borderline low voltage, extremity leads RSR' in V1 or V2, right VCD or RVH  Confirmed by DELOS  MD, Korinne Greenstein ([id number]) on 05/19/2014 2:54:07 PM      MDM   Final diagnoses:  Hypoxia    Patient is a 69 year old male with history of resection of brain tumor in August 2015. He has been having episodes of dizziness that have been unexplained. He had an additional episode today while wearing a Holter monitor. I was able to speak with cardiologist who saw nothing more than a sinus tachycardia during his episode. He is neurologically intact and workup  so far is essentially unremarkable. He was recently diagnosed with a DVT and is currently taking Xarelto. I considered the possibility of pulmonary embolism, however he is already taking a blood thinner, and with his creatinine of 2.5 do not feel as though a CT scan is appropriate. He is not hypoxic, not having chest pain or shortness of breath.  He has a follow-up appointment in the next week with his neurosurgeon here and for an MRI at [facility] and I have advised him to keep these appointments. He is to return as needed for any problems.    Veryl Speak, MD 05/19/14 [phone]

## 2014-05-19 NOTE — Discharge Instructions (Signed)
Follow-up with your primary Dr. and oncologist as scheduled.  Return to the emergency department if your symptoms substantially worsen or change.   Syncope Syncope is a medical term for fainting or passing out. This means you lose consciousness and drop to the ground. People are generally unconscious for less than 5 minutes. You may have some muscle twitches for up to 15 seconds before waking up and returning to normal. Syncope occurs more often in older adults, but it can happen to anyone. While most causes of syncope are not dangerous, syncope can be a sign of a serious medical problem. It is important to seek medical care.  CAUSES  Syncope is caused by a sudden drop in blood flow to the brain. The specific cause is often not determined. Factors that can bring on syncope include:  Taking medicines that lower blood pressure.  Sudden changes in posture, such as standing up quickly.  Taking more medicine than prescribed.  Standing in one place for too long.  Seizure disorders.  Dehydration and excessive exposure to heat.  Low blood sugar (hypoglycemia).  Straining to have a bowel movement.  Heart disease, irregular heartbeat, or other circulatory problems.  Fear, emotional distress, seeing blood, or severe pain. SYMPTOMS  Right before fainting, you may:  Feel dizzy or light-headed.  Feel nauseous.  See all white or all black in your field of vision.  Have cold, clammy skin. DIAGNOSIS  Your health care provider will ask about your symptoms, perform a physical exam, and perform an electrocardiogram (ECG) to record the electrical activity of your heart. Your health care provider may also perform other heart or blood tests to determine the cause of your syncope which may include:  Transthoracic echocardiogram (TTE). During echocardiography, sound waves are used to evaluate how blood flows through your heart.  Transesophageal echocardiogram (TEE).  Cardiac monitoring. This  allows your health care provider to monitor your heart rate and rhythm in real time.  Holter monitor. This is a portable device that records your heartbeat and can help diagnose heart arrhythmias. It allows your health care provider to track your heart activity for several days, if needed.  Stress tests by exercise or by giving medicine that makes the heart beat faster. TREATMENT  In most cases, no treatment is needed. Depending on the cause of your syncope, your health care provider may recommend changing or stopping some of your medicines. HOME CARE INSTRUCTIONS  Have someone stay with you until you feel stable.  Do not drive, use machinery, or play sports until your health care provider says it is okay.  Keep all follow-up appointments as directed by your health care provider.  Lie down right away if you start feeling like you might faint. Breathe deeply and steadily. Wait until all the symptoms have passed.  Drink enough fluids to keep your urine clear or pale yellow.  If you are taking blood pressure or heart medicine, get up slowly and take several minutes to sit and then stand. This can reduce dizziness. SEEK IMMEDIATE MEDICAL CARE IF:   You have a severe headache.  You have unusual pain in the chest, abdomen, or back.  You are bleeding from your mouth or rectum, or you have black or tarry stool.  You have an irregular or very fast heartbeat.  You have pain with breathing.  You have repeated fainting or seizure-like jerking during an episode.  You faint when sitting or lying down.  You have confusion.  You have trouble walking.  You have severe weakness.  You have vision problems. If you fainted, call your local emergency services (911 in [place] Do not drive yourself to the hospital.  MAKE SURE YOU:  Understand these instructions.  Will watch your condition.  Will get help right away if you are not doing well or get worse. Document Released: 05/02/2005  Document Revised: 05/07/2013 Document Reviewed: 07/01/2011 [facility] Patient Information 2015 [place], [place]. This information is not intended to replace advice given to you by your health care provider. Make sure you discuss any questions you have with your health care provider.

## 2014-05-19 NOTE — ED Notes (Signed)
Patient with reported dx of brain tumor in August 2015.  Today he reports he noticed that he felt generally weaker.  Patient reported to have a fall due to loss of balance.  There was a period of time that he was at baseline and then patient had a reported episode that he was unreponsive and having snoring respirations.   EMs called to scene.  Patient was alert and oriented.  No confusion.   He was noted to have room air pulse ox of 86,  Patient transported with oxygen.  Patient cbg 155.   Patient is alert and oriented.  He denies headache.  Patient has hx back pain but today's fall has increased his pain.  Patient denies any other injuries.  Patient noted to have swelling to bil lower extremities and abdomen.  He states he was dx with blood clot in the left leg and started on xeralto at that time.  Patient weighs self daily and reports 1 pound weight gain.

## 2014-05-19 NOTE — ED Notes (Signed)
Patient has IV in the left wrist.  Unable to draw blood.  Attempted to start another IV.  Unable.  Will call IV team

## 2014-05-19 NOTE — ED Notes (Signed)
Patient transported to CT 

## 2014-05-20 ENCOUNTER — Ambulatory Visit: Payer: BC Managed Care – PPO

## 2014-05-20 ENCOUNTER — Telehealth: Payer: Self-pay

## 2014-05-20 NOTE — Telephone Encounter (Signed)
-----   Message from Kaylyn Lim sent at 05/20/2014  8:25 AM EST ----- Regarding: cx 11:45 DOB 12/12/1945 [phone] Please call, had a fall yest and had to be taken to ER at [facility]. Cancel 11:45 appt this am.

## 2014-05-20 NOTE — Telephone Encounter (Signed)
PT called pt back, per pt request. He reported he fell and experienced seizure-like symptoms, he went to the ED where imaging was completed and negative for fractures or bleeds. Pt is unable to walk due to back pain and had to cancel today's appt. (05/20/14). Pt also reported his MD found a blood clot in his R LE last week. PT informed pt that we will need a note to resume therapy prior to his next appt. Pt verbalized understanding and agreement.

## 2014-05-22 ENCOUNTER — Telehealth: Payer: Self-pay | Admitting: Hematology & Oncology

## 2014-05-22 NOTE — Telephone Encounter (Signed)
Pt moved 1-12 to 1-27

## 2014-05-23 ENCOUNTER — Telehealth: Payer: Self-pay

## 2014-05-23 ENCOUNTER — Telehealth: Payer: Self-pay | Admitting: Cardiology

## 2014-05-23 ENCOUNTER — Ambulatory Visit: Payer: BC Managed Care – PPO | Attending: Hematology & Oncology

## 2014-05-23 DIAGNOSIS — R5381 Other malaise: Secondary | ICD-10-CM | POA: Insufficient documentation

## 2014-05-23 DIAGNOSIS — R296 Repeated falls: Secondary | ICD-10-CM | POA: Insufficient documentation

## 2014-05-23 DIAGNOSIS — M6281 Muscle weakness (generalized): Secondary | ICD-10-CM | POA: Insufficient documentation

## 2014-05-23 DIAGNOSIS — R269 Unspecified abnormalities of gait and mobility: Secondary | ICD-10-CM | POA: Insufficient documentation

## 2014-05-23 NOTE — Telephone Encounter (Signed)
Agree. Trying to get full amount of information.

## 2014-05-23 NOTE — Telephone Encounter (Signed)
New message       1. Is this related to a heart monitor you are wearing?  (If the patient says no, please ask     if they are caling about ICD/pacemaker.) yes 2. What is your issue??  Patient fell and is now wearing a back brace.  Is there any way possible he can stop wearing the monitor?  Do we have enough data?  When patient fell--he felt dizzy and then fell.  Pt has a lot of other health issures.  (If the patient is calling for results of the heart monitor this     message should be sent to nurse.)

## 2014-05-23 NOTE — Therapy (Signed)
[facility] [address] [place], [place], [address] Phone: [phone]   Fax:  [phone]  Patient Details  Name: Edward [name] MRN: [id number] Date of Birth: July 19, 1945 Referring Provider:  No ref. provider found  Encounter Date: 05/23/2014  PHYSICAL THERAPY DISCHARGE SUMMARY  Visits from Start of Care: 5  Current functional level related to goals / functional outcomes: PT LONG TERM GOAL #1     Title  Pt will verbalize agreement and understanding of falls prevention strategies. (05/14/14).    Status  On-going    PT LONG TERM GOAL #2    Title  Pt will be independent in HEP in order to improve functional mobility. (05/14/14).    Status  On-going    PT LONG TERM GOAL #3    Title  Assess BERG and write goal. (05/14/14).    Status  Achieved    PT LONG TERM GOAL #4    Title  Pt will be able to ascend/descend 14 steps with two handrails, in step over pattern, at MOD I level to improve functional mobility. (12/30/ 15).    Status  On-going    PT LONG TERM GOAL #5    Title  Pt will report he is able to run 2 loads of laundry per week, without assistance from his wife, in order to be independent in ADLs. (12/ 30/15).    Status  On-going    PT LONG TERM GOAL #6    Title  Pt will be able to perform all tub transfers at MOD I level to improve functional mobility. (12/ 30/15).    Status  On-going    PT LONG TERM GOAL #7    Title  Pt will improve Neuro QOL-LE questionnaire by 10 points to improve quality of life. (05/14/14).    Status  On-going    PT LONG TERM GOAL #8    Title  Assess DGI and write appropriate goal. (12/30/ 15).    Status  Achieved    PT LONG TERM GOAL #9    TITLE  Pt will improve BERG score to >/=49/56 to reduce falls risk. Target date: 05/14/14.    Status  On-going    PT LONG TERM GOAL #10    TITLE  Pt will improve DGI score to  >/=20/24 to reduce falls risk during ambulation. Target date: 12/ 30/15.    Status  On-going       Remaining deficits: It is unknown if patient goals were met or what his remaining deficits are, as he did not return to PT due to lumbar compression fractures and wishing to begin HHPT.   Education / Equipment: HEP  Plan: Patient agrees to discharge.  Patient goals were not met. Patient is being discharged due to the patient's request.  ?????   Pt wishes to end OPPT neuro in order to begin HHPT, due to difficulty leaving house due to back pain.                      Leota Maka L 05/23/2014, 10:47 AM  [facility] [address] [place], [place], [address] Phone: [phone]   Fax:  [phone]   Geoffry Paradise, PT,DPT 05/23/2014 10:47 AM Phone: [phone] Fax: [phone]

## 2014-05-23 NOTE — Telephone Encounter (Signed)
PT spoke with pt's wife regarding missed 05/23/14 appt. Pt's wife, Magda Paganini, stated pt does have lumbar compression fractures due to fall on Monday and must wear a brace. She reported the MD recommended home health PT at this time. PT and pt's wife agreed to d/c pt from current OPPT neuro, so pt can begin HHPT. PT informed pt's wife that pt would need a new order for OPPT neuro once HHPT is complete. Pt's wife verbalized understanding and agreement.

## 2014-05-23 NOTE — Telephone Encounter (Signed)
Spoke with wife who is concerned about how much longer pt needs to wear event monitor because he is now having to wear a back brace after a recent fall.  Advised I had Dr Marlou Porch review the monitor results that we have received so far and while he is only seeing PVCs, he does want him to continue to wear it because of falls, dizziness and syncope.  Monitor was placed 12/18 for 30 days.  He has about 9 days left to wear the monitor.  She states understanding.

## 2014-05-23 NOTE — Telephone Encounter (Signed)
Obtained monitor strips for Dr Marlou Porch to review.

## 2014-05-26 ENCOUNTER — Ambulatory Visit: Payer: BC Managed Care – PPO | Admitting: Hematology & Oncology

## 2014-05-26 ENCOUNTER — Other Ambulatory Visit: Payer: BC Managed Care – PPO | Admitting: Lab

## 2014-05-27 ENCOUNTER — Other Ambulatory Visit: Payer: BC Managed Care – PPO | Admitting: Lab

## 2014-05-27 ENCOUNTER — Ambulatory Visit: Payer: BC Managed Care – PPO | Admitting: Hematology & Oncology

## 2014-06-09 ENCOUNTER — Ambulatory Visit: Payer: BC Managed Care – PPO | Admitting: Cardiology

## 2014-06-11 ENCOUNTER — Other Ambulatory Visit (HOSPITAL_BASED_OUTPATIENT_CLINIC_OR_DEPARTMENT_OTHER): Payer: BLUE CROSS/BLUE SHIELD | Admitting: Lab

## 2014-06-11 ENCOUNTER — Encounter: Payer: Self-pay | Admitting: Cardiology

## 2014-06-11 ENCOUNTER — Ambulatory Visit (INDEPENDENT_AMBULATORY_CARE_PROVIDER_SITE_OTHER): Payer: BLUE CROSS/BLUE SHIELD | Admitting: Cardiology

## 2014-06-11 ENCOUNTER — Encounter: Payer: Self-pay | Admitting: Hematology & Oncology

## 2014-06-11 ENCOUNTER — Ambulatory Visit (HOSPITAL_BASED_OUTPATIENT_CLINIC_OR_DEPARTMENT_OTHER): Payer: BLUE CROSS/BLUE SHIELD | Admitting: Hematology & Oncology

## 2014-06-11 ENCOUNTER — Other Ambulatory Visit: Payer: Self-pay | Admitting: *Deleted

## 2014-06-11 VITALS — BP 118/82 | HR 76 | Ht 71.0 in | Wt 237.0 lb

## 2014-06-11 VITALS — BP 121/74 | HR 70 | Temp 98.4°F | Resp 20 | Ht 71.0 in | Wt 237.0 lb

## 2014-06-11 DIAGNOSIS — R2243 Localized swelling, mass and lump, lower limb, bilateral: Secondary | ICD-10-CM

## 2014-06-11 DIAGNOSIS — C711 Malignant neoplasm of frontal lobe: Secondary | ICD-10-CM

## 2014-06-11 DIAGNOSIS — R55 Syncope and collapse: Secondary | ICD-10-CM | POA: Insufficient documentation

## 2014-06-11 DIAGNOSIS — I82401 Acute embolism and thrombosis of unspecified deep veins of right lower extremity: Secondary | ICD-10-CM

## 2014-06-11 DIAGNOSIS — M7989 Other specified soft tissue disorders: Secondary | ICD-10-CM

## 2014-06-11 LAB — CMP (CANCER CENTER ONLY)
ALT(SGPT): 20 U/L (ref 10–47)
AST: 25 U/L (ref 11–38)
Albumin: 3.3 g/dL (ref 3.3–5.5)
Alkaline Phosphatase: 146 U/L — ABNORMAL HIGH (ref 26–84)
BUN, Bld: 15 mg/dL (ref 7–22)
CHLORIDE: 101 meq/L (ref 98–108)
CO2: 27 meq/L (ref 18–33)
Calcium: 9.4 mg/dL (ref 8.0–10.3)
Creat: 2.2 mg/dl — ABNORMAL HIGH (ref 0.6–1.2)
Glucose, Bld: 89 mg/dL (ref 73–118)
POTASSIUM: 4.4 meq/L (ref 3.3–4.7)
Sodium: 140 mEq/L (ref 128–145)
TOTAL PROTEIN: 6.3 g/dL — AB (ref 6.4–8.1)
Total Bilirubin: 0.7 mg/dl (ref 0.20–1.60)

## 2014-06-11 LAB — CBC WITH DIFFERENTIAL (CANCER CENTER ONLY)
BASO#: 0.1 10*3/uL (ref 0.0–0.2)
BASO%: 1.1 % (ref 0.0–2.0)
EOS ABS: 0.4 10*3/uL (ref 0.0–0.5)
EOS%: 4.3 % (ref 0.0–7.0)
HEMATOCRIT: 36.5 % — AB (ref 38.7–49.9)
HEMOGLOBIN: 11.6 g/dL — AB (ref 13.0–17.1)
LYMPH#: 1.4 10*3/uL (ref 0.9–3.3)
LYMPH%: 16.3 % (ref 14.0–48.0)
MCH: 32.1 pg (ref 28.0–33.4)
MCHC: 31.8 g/dL — ABNORMAL LOW (ref 32.0–35.9)
MCV: 101 fL — ABNORMAL HIGH (ref 82–98)
MONO#: 1.1 10*3/uL — ABNORMAL HIGH (ref 0.1–0.9)
MONO%: 12.6 % (ref 0.0–13.0)
NEUT#: 5.6 10*3/uL (ref 1.5–6.5)
NEUT%: 65.7 % (ref 40.0–80.0)
Platelets: 298 10*3/uL (ref 145–400)
RBC: 3.61 10*6/uL — ABNORMAL LOW (ref 4.20–5.70)
RDW: 17.2 % — ABNORMAL HIGH (ref 11.1–15.7)
WBC: 8.5 10*3/uL (ref 4.0–10.0)

## 2014-06-11 LAB — LACTATE DEHYDROGENASE: LDH: 210 U/L (ref 94–250)

## 2014-06-11 MED ORDER — TEMOZOLOMIDE 250 MG PO CAPS
ORAL_CAPSULE | ORAL | Status: DC
Start: 2014-06-11 — End: 2014-07-10

## 2014-06-11 MED ORDER — TEMOZOLOMIDE 180 MG PO CAPS: ORAL_CAPSULE | ORAL | Status: DC

## 2014-06-11 MED ORDER — TEMOZOLOMIDE 180 MG PO CAPS
ORAL_CAPSULE | ORAL | Status: DC
Start: 2014-06-11 — End: 2014-06-11

## 2014-06-11 MED ORDER — TEMOZOLOMIDE 250 MG PO CAPS: ORAL_CAPSULE | ORAL | Status: DC

## 2014-06-11 NOTE — Patient Instructions (Signed)
Your physician recommends that you continue on your current medications as directed. Please refer to the Current Medication list given to you today.  Your physician recommends that you schedule a follow-up appointment AS NEEDED with Dr. Marlou Porch.

## 2014-06-11 NOTE — Progress Notes (Signed)
Edward [name]. [address] [place], [place]  [address] Phone: [phone] Fax:  [phone]  Date:  06/11/2014   ID:  BRENDA COWHER, DOB 12/19/1945, MRN [id number]  PCP:  Cari Caraway, MD   History of Present Illness: Edward [name] is a 69 y.o. male with glioblastoma, finished chemotherapy on 03/20/2014 with regression of tumor here for follow-up of syncope.   Event monitor, echocardiogram reassuring. No adverse arrhythmias, normal ejection fraction.  Her last visit, he has had seizures. Fell, compression fracture spine. Left sided DVT discovered. Placed on Xarelto.  Previously complained of confusion with several episodes of feeling weekend, minimally short of breath. He fell 3 times. 2 weeks ago. Hit head on coffee table. Felt it coming on. Has little upper body strength, he states.  Come on when standing up. Gets weak, looses balance. Never had problems before. Neuro at [facility] though she had irreg HB. Did not feel as though he completely "blacked out". Does not remember having palpitations, tachycardia. No associated seizures. Has never had any prior cardiac history. Can feel dyspnea when going up stairs, feels weak, has to stop, fear of falling. At refrig, slow gradual fall to ground. He went through chemo at [facility]. Was having trouble walking post chemo despite chemo results. Local oncologist though he was weak because of steroids. Taper has occurred. Has had left renal cell cancer status post nephrectomy, depression, basal cell carcinoma, tobacco use, spinal stenosis. He had a neurologic evaluation at [facility] after this event which apparently was normal. Hemoglobin 13.5, creatinine 1.95 with BUN of 41-slightly elevated when compared to prior.     Wt Readings from Last 3 Encounters:  06/11/14 237 lb (107.502 kg)  06/11/14 237 lb (107.502 kg)  05/19/14 233 lb (105.688 kg)     Past Medical History  Diagnosis Date  . HTN (hypertension)   . Hypercholesteremia    . Depression   . Cancer     Renal  . H/O diplopia 01/10/2013  . OSA on CPAP 04/08/2013  . S/P radiation therapy 02/06/2014-03/20/2014    1) Right frontal lobe tumor bed initial / 46 Gy in 23 fractions/ 2) Right frontal lobe tumor bed boost / 14 Gy in 7 fractions      Past Surgical History  Procedure Laterality Date  . Kidney surgery  2009  . Penile prosthesis implant  2011  . Craniotomy N/A 01/01/2014    Procedure: CRANIOTOMY FOR TUMOR EXCISION;  Surgeon: Newman Pies, MD;  Location: [facility];  Service: Neurosurgery;  Laterality: N/A;  Craniotomy for tumor resection with stealth    Current Outpatient Prescriptions  Medication Sig Dispense Refill  . allopurinol (ZYLOPRIM) 100 MG tablet Take 100 mg by mouth daily.     Marland Kitchen atorvastatin (LIPITOR) 40 MG tablet Take 40 mg by mouth daily.     Marland Kitchen buPROPion (WELLBUTRIN XL) 300 MG 24 hr tablet Take 300 mg by mouth daily.    . citalopram (CELEXA) 40 MG tablet Take 1 tablet (40 mg total) by mouth daily. 30 tablet 2  . furosemide (LASIX) 40 MG tablet Take 60 mg by mouth daily. TAKING 1 AND 1/2 TAB = 60 MF IN AM    . HYDROcodone-acetaminophen (NORCO/VICODIN) 5-325 MG per tablet Take 1 tablet by mouth every 6 (six) hours as needed.     Marland Kitchen KLOR-CON M20 20 MEQ tablet Take 30 mEq by mouth daily.     Marland Kitchen lacosamide (VIMPAT) 50 MG TABS tablet Take 50  mg by mouth 2 (two) times daily. 50 MG. IN AM AND 100 MG AT BEDTIME    . metolazone (ZAROXOLYN) 2.5 MG tablet Take 1 tablet (2.5 mg total) by mouth daily. 30 tablet 4  . metoprolol succinate (TOPROL-XL) 100 MG 24 hr tablet Take 100 mg by mouth daily.     . Multiple Vitamins-Minerals (MULTIVITAMIN PO) Take 1 tablet by mouth daily.    Marland Kitchen omeprazole (PRILOSEC) 40 MG capsule Take 40 mg by mouth daily.     . ondansetron (ZOFRAN) 8 MG tablet Take 1 pill 23min prior to Temodar 30 tablet 3  . rivaroxaban (XARELTO) 20 MG TABS tablet Take 1 tablet (20 mg total) by mouth daily with supper. (Patient taking differently:  Take 20 mg by mouth every morning. ) 30 tablet 12  . temozolomide (TEMODAR) 180 MG capsule Take 1 pill a day for 5 days at bedtime. 5 capsule 11  . temozolomide (TEMODAR) 250 MG capsule Take 1 pill a day for 5 days at bedtime 5 capsule 11  . zolpidem (AMBIEN) 10 MG tablet Take 10 mg by mouth at bedtime.      No current facility-administered medications for this visit.    Allergies:    Allergies  Allergen Reactions  . Nsaids Other (See Comments)    Not good for kidney    Social History:  The patient  reports that he has been smoking Cigarettes.  He started smoking about 53 years ago. He has a 53 pack-year smoking history. He has never used smokeless tobacco. He reports that he does not drink alcohol or use illicit drugs.   Family History  Problem Relation Age of Onset  . Cancer Mother   . Heart failure Father    father had myocardial infarction at age 42.  ROS:  Please see the history of present illness.   Positive for sensation of falling out. Positive for glioblastoma.   All other systems reviewed and negative.   PHYSICAL EXAM: VS:  BP 118/82 mmHg  Pulse 76  Ht 5\' 11"  (1.803 m)  Wt 237 lb (107.502 kg)  BMI 33.07 kg/m2 Well nourished, well developed, in no acute distress HEENT: normal, Ruth/AT, EOMI, moon like facies Neck: no JVD, normal carotid upstroke, no bruit Cardiac:  normal S1, S2; RRR; no murmur Lungs:  clear to auscultation bilaterally, no wheezing, rhonchi or rales Abd: soft, nontender, no hepatomegaly, no bruitsOverweight Ext: 2+ BLE edema, 2+ distal pulses Skin: warm and dry GU: deferred Neuro: no focal abnormalities noted, AAO x 3  EKG:  04/28/14-sinus rhythm, left anterior fascicular block, nonspecific ST flattening. QT interval 400 ms. Normal PR interval.  Event monitor: 05/02/14 through 05/31/14-no adverse arrhythmias, rare PAC.  ECHO: 04/30/14-normal ejection fraction, reassuring  ASSESSMENT AND PLAN:  1. Near syncope-certainly there are several  possibilities which could contribute to sensation of extreme weakness, falls include possible neural mediated syncope. Orthostatics have been normal. This is possibly sequelae of his glioblastoma. Thankfully, echocardiogram is normal. Event monitor also was normal. No adverse arrhythmias identified. His oncologist did think that some of his weakness has been associated with prolonged steroid use, myopathy as a possibility. Maintain adequate hydration. 2. Deep vein thrombosis-being treated with Xarelto. 3. Dyspnea-reassuring echocardiogram.  4. We will see back as needed.  Signed, Candee Furbish, MD [facility]  06/11/2014 2:02 PM

## 2014-06-12 NOTE — Progress Notes (Signed)
Hematology and Oncology Follow Up Visit  Edward [name] [id number] May 09, 1946 69 y.o. 06/12/2014   Principle Diagnosis:   Right frontal glioblastoma multiforme  DVT of the right lower leg  History of localized renal cell carcinoma of the left kidney  Current Therapy:    Status post 1 cycle of full dose Temodar  Patient to start Xarelto     Interim History:  [name] is back for follow-up. He still is in a wheelchair. He is doing some physical therapy.  He is doing okay with Xarelto. He has occasional swelling in the legs. I think this might be more related to medications and possibly his renal insufficiency. There is no pain in the right leg.  He may have had some seizure activity a week or so ago. He now is on Vimpat. This is given to him by the oncologist at [facility].  He is off Decadron now. 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 Hopefully this will improve.  His appetite is down a little bit. There's been no nausea or vomiting. He's not had any problems with bowels or bladder.  There's been no cough. He's had no shortness of breath. He's had no issues with pain.   Overall, his performance status is ECOG 2.       Medications:  Current outpatient prescriptions:  .  allopurinol (ZYLOPRIM) 100 MG tablet, Take 100 mg by mouth daily. , Disp: , Rfl:  .  atorvastatin (LIPITOR) 40 MG tablet, Take 40 mg by mouth daily. , Disp: , Rfl:  .  buPROPion (WELLBUTRIN XL) 300 MG 24 hr tablet, Take 300 mg by mouth daily., Disp: , Rfl:  .  citalopram (CELEXA) 40 MG tablet, Take 1 tablet (40 mg total) by mouth daily., Disp: 30 tablet, Rfl: 2 .  furosemide (LASIX) 40 MG tablet, Take 60 mg by mouth daily. TAKING 1 AND 1/2 TAB = 60 MF IN AM, Disp: , Rfl:  .  HYDROcodone-acetaminophen (NORCO/VICODIN) 5-325 MG per tablet, Take 1 tablet by mouth every 6 (six) hours as needed. , Disp: , Rfl:  .  KLOR-CON M20 20 MEQ tablet, Take 30 mEq by mouth daily. , Disp: , Rfl:  .   lacosamide (VIMPAT) 50 MG TABS tablet, Take 50 mg by mouth 2 (two) times daily. 50 MG. IN AM AND 100 MG AT BEDTIME, Disp: , Rfl:  .  metoprolol succinate (TOPROL-XL) 100 MG 24 hr tablet, Take 100 mg by mouth daily. , Disp: , Rfl:  .  Multiple Vitamins-Minerals (MULTIVITAMIN PO), Take 1 tablet by mouth daily., Disp: , Rfl:  .  omeprazole (PRILOSEC) 40 MG capsule, Take 40 mg by mouth daily. , Disp: , Rfl:  .  rivaroxaban (XARELTO) 20 MG TABS tablet, Take 1 tablet (20 mg total) by mouth daily with supper. (Patient taking differently: Take 20 mg by mouth every morning. ), Disp: 30 tablet, Rfl: 12 .  zolpidem (AMBIEN) 10 MG tablet, Take 10 mg by mouth at bedtime. , Disp: , Rfl:  .  metolazone (ZAROXOLYN) 2.5 MG tablet, Take 1 tablet (2.5 mg total) by mouth daily., Disp: 30 tablet, Rfl: 4 .  ondansetron (ZOFRAN) 8 MG tablet, Take 1 pill 54min prior to Temodar, Disp: 30 tablet, Rfl: 3 .  temozolomide (TEMODAR) 180 MG capsule, Take 1 pill a day for 5 days at bedtime., Disp: 5 capsule, Rfl: 11 .  temozolomide (TEMODAR) 250 MG capsule, Take 1 pill a day for 5 days at bedtime, Disp: 5 capsule,  Rfl: 11  Allergies:  Allergies  Allergen Reactions  . Nsaids Other (See Comments)    Not good for kidney    Past Medical History, Surgical history, Social history, and Family History were reviewed and updated.  Review of Systems: As above  Physical Exam:  height is 5\' 11"  (1.803 m) and weight is 237 lb (107.502 kg). His oral temperature is 98.4 F (36.9 C). His blood pressure is 121/74 and his pulse is 70. His respiration is 20.   Cushingoid appearing white male. He is in no distress. He is alert and oriented. Head and neck exam shows no adenopathy. He has no ocular or oral lesions. Pupils react appropriately. Lungs are clear. Cardiac exam regular rate and rhythm with no murmurs, rubs or bruits. Abdomen is soft, he is moderately obese. There is no fluid wave. There is no palpable liver or spleen tip.  Extremities shows 1+ edema in his lower legs. There may be some slight increase in edema in the right leg. No palpable venous cord is noted. He has good range of motion of his joints. He has 4/5 strength in his lower legs. He has 3/5 strength in his thighs. Neurological exam shows no focal neurological deficits. Back exam shows no tenderness over the spine, ribs or hips. Skin exam shows no rashes, or ecchymosis or petechia.  Lab Results  Component Value Date   WBC 8.5 06/11/2014   HGB 11.6* 06/11/2014   HCT 36.5* 06/11/2014   MCV 101* 06/11/2014   PLT 298 06/11/2014     Chemistry      Component Value Date/Time   NA 140 06/11/2014 0926   NA 141 05/19/2014 1447   NA 138 02/28/2014 1048   K 4.4 06/11/2014 0926   K 4.3 05/19/2014 1447   K 4.6 02/28/2014 1048   CL 101 06/11/2014 0926   CL 103 05/19/2014 1447   CO2 27 06/11/2014 0926   CO2 25 05/19/2014 1248   CO2 22 02/28/2014 1048   BUN 15 06/11/2014 0926   BUN 33* 05/19/2014 1447   BUN 53.2* 02/28/2014 1048   CREATININE 2.2* 06/11/2014 0926   CREATININE 2.50* 05/19/2014 1447   CREATININE 2.5* 02/28/2014 1048      Component Value Date/Time   CALCIUM 9.4 06/11/2014 0926   CALCIUM 9.0 05/19/2014 1248   CALCIUM 9.2 02/28/2014 1048   ALKPHOS 146* 06/11/2014 0926   ALKPHOS 93 05/19/2014 1248   ALKPHOS 106 02/28/2014 1048   AST 25 06/11/2014 0926   AST 38* 05/19/2014 1248   AST 45* 02/28/2014 1048   ALT 20 06/11/2014 0926   ALT 46 05/19/2014 1248   ALT 118* 02/28/2014 1048   BILITOT 0.70 06/11/2014 0926   BILITOT 0.4 05/19/2014 1248   BILITOT 0.35 02/28/2014 1048         Impression and Plan: Edward [name] is 69 year old gentleman with a partially resected glioblastoma of the right frontal lobe.  Of note, his tumor is not MGMT methylated. This typically indicates a more aggressive type tumor.  He still is having a tough time.  I explained to them the problem that they had at [facility] with the MRI report. Again, with his  renal function, it's difficult to give contrast. We often see changes that suggest tumor progression but actually is pseudo-progression.  Hopefully, he will continue to make some progress. I think physical therapy will be helpful for him. He is still trying this.  I will plan to see him back in about one month.  I spent over 30 minutes with he has wife. I went over his MRI report that he had done at [facility]. I went over his lab work.  I want to get a Doppler of his right leg when we see him back to see how the blood clot is responding to Xarelto.  Volanda Napoleon, MD 1/28/20167:44 AM

## 2014-06-20 ENCOUNTER — Ambulatory Visit
Admission: RE | Admit: 2014-06-20 | Discharge: 2014-06-20 | Disposition: A | Discharge status: Home / Self Care | Payer: BLUE CROSS/BLUE SHIELD | Source: Ambulatory Visit | Attending: Radiation Oncology | Admitting: Radiation Oncology

## 2014-06-20 VITALS — BP 146/78 | HR 65 | Temp 98.7°F

## 2014-06-20 DIAGNOSIS — C711 Malignant neoplasm of frontal lobe: Secondary | ICD-10-CM

## 2014-06-20 MED ORDER — DEXAMETHASONE 2 MG PO TABS: ORAL_TABLET | ORAL | Status: DC

## 2014-06-20 NOTE — Progress Notes (Signed)
Patient here for routine follow up post completion of radiation on 03/20/14 to right frontal lobe for glioblastoma.Denies pain.No hospitalizations but did go to emergency department for what he thought was seizure activity was negative but found to have compressure fractures x 2 of back.Balance problems haven't improved.lHas blood clot in right leg on xarelto.MRI brain performed at [facility] in June 2016.See results in care everywhere.

## 2014-06-20 NOTE — Progress Notes (Signed)
.  Radiation Oncology         [phone]) [phone] ________________________________  Name: Edward [name] MRN: [id number]  Date: 06/20/2014  DOB: [birth year]  Follow-Up Visit Note  Outpatient  CC: MCNEILL,WENDY, MD  Kristeen Miss, MD  Diagnosis and Prior Radiotherapy:    ICD-9-CM ICD-10-CM   1. Glioblastoma 191.1 C71.1 dexamethasone (DECADRON) 2 MG tablet    GLIOBLASTOMA of RIGHT FRONTAL LOBE   Indication for treatment: curative   Radiation treatment dates: 02/06/2014-03/20/2014  Site/dose:  1) Right frontal lobe tumor bed initial / 46 Gy in 23 fractions 2) Right frontal lobe tumor bed boost / 14 Gy in 7 fractions  Narrative:  The patient returns today for routine follow-up. His main complaint: cannot ambulate due to dizziness.  He describes this as dizziness but it is not vertigo or pre-syncopal (doesn't feel spinning sensation or pre-blacking out.) Blood pressures haven't been low per wife.    He has intermittent HA's that aren't severe.  He has tapered from Decadron as of weeks ago.  No hospitalizations but did go to emergency department for what he thought was seizure activity was negative but found to have compressure fractures x 2 of back. Wears back brace for this now and has seen ortho.  Balance problems haven't improved. Has blood clot in right leg on xarelto. MRI brain performed at [facility] in Jan. This was without contrast due to kidney function, and shows increased T2  abnormality. I have report (see below ) but not the CD/images.  Continues PT.  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  Dr Simmie Davies at [facility] started the patient on Vimpat 50 mg po BID. He continues pulsatile Temodar.    ALLERGIES:  is allergic to nsaids.  Meds: Current Outpatient Prescriptions  Medication Sig Dispense Refill  . allopurinol (ZYLOPRIM) 100 MG tablet Take 100 mg by mouth daily.     Marland Kitchen atorvastatin (LIPITOR) 40 MG tablet Take 40 mg by mouth daily.     Marland Kitchen buPROPion (WELLBUTRIN  XL) 300 MG 24 hr tablet Take 300 mg by mouth daily.    . citalopram (CELEXA) 40 MG tablet Take 1 tablet (40 mg total) by mouth daily. 30 tablet 2  . furosemide (LASIX) 40 MG tablet Take 60 mg by mouth daily. TAKING 1 AND 1/2 TAB = 60 MF IN AM    . HYDROcodone-acetaminophen (NORCO/VICODIN) 5-325 MG per tablet Take 1 tablet by mouth every 6 (six) hours as needed.     Marland Kitchen KLOR-CON M20 20 MEQ tablet Take 30 mEq by mouth daily.     Marland Kitchen lacosamide (VIMPAT) 50 MG TABS tablet Take 50 mg by mouth 2 (two) times daily. 50 MG. IN AM AND 100 MG AT BEDTIME    . Multiple Vitamins-Minerals (MULTIVITAMIN PO) Take 1 tablet by mouth daily.    Marland Kitchen omeprazole (PRILOSEC) 40 MG capsule Take 40 mg by mouth daily.     . ondansetron (ZOFRAN) 8 MG tablet Take 1 pill 69min prior to Temodar 30 tablet 3  . rivaroxaban (XARELTO) 20 MG TABS tablet Take 1 tablet (20 mg total) by mouth daily with supper. (Patient taking differently: Take 20 mg by mouth every morning. ) 30 tablet 12  . temozolomide (TEMODAR) 180 MG capsule Take 1 pill a day for 5 days at bedtime. 5 capsule 11  . temozolomide (TEMODAR) 250 MG capsule Take 1 pill a day for 5 days at bedtime 5 capsule 11  . zolpidem (AMBIEN) 10 MG tablet Take 10 mg by mouth  at bedtime.     Marland Kitchen dexamethasone (DECADRON) 2 MG tablet Take 2 tablets three times a day on day 1, then 1 tab twice a day thereafter. Call Dr. Isidore Moos on 2/24 to update. 60 tablet 2  . metolazone (ZAROXOLYN) 2.5 MG tablet Take 1 tablet (2.5 mg total) by mouth daily. (Patient not taking: Reported on 06/20/2014) 30 tablet 4  . metoprolol succinate (TOPROL-XL) 100 MG 24 hr tablet Take 100 mg by mouth daily.     Marland Kitchen sulfamethoxazole-trimethoprim (BACTRIM DS,SEPTRA DS) 800-160 MG per tablet      No current facility-administered medications for this encounter.    Physical Findings: The patient is in no acute distress. temperature is 98.7 F (37.1 C). His blood pressure is 146/78 and his pulse is 65. His oxygen saturation is 99%.  .    Oral cavity - no thrush. Masked faces / right facial weakness, stable Visual quadrants not totally intact??  Left eye: medial quadrants not intact, Right eye: medial and lateral quadrants not intact.  Part of this may be due to patient hesitating to declare when he sees my fingers. I retried this several times. Strength grossly symmetric in arms and legs with no obvious weakness per thigh flexion Finger to nose grossly intract. Speech fluent Rapidly alternating movements intact. Travel by wheelchair.   Lab Findings: Lab Results  Component Value Date   WBC 8.5 06/11/2014   HGB 11.6* 06/11/2014   HCT 36.5* 06/11/2014   MCV 101* 06/11/2014   PLT 298 06/11/2014    Radiographic Findings: [place] 05-28-14 ** MRI BRAIN WITHOUT CONTRAST **  INDICATION: C71.1 Malignant neoplasm of frontal lobe, Evaluate for tumor progression provided.  COMPARISON: 03/31/2014  TECHNIQUE/PROTOCOL: Standard noncontrast protocol adult brain MRI performed.  FINDINGS: Interval increased masslike T2 signal in the right frontal lobe and involving the anterior body of the corpus callosum. Enhancement characteristics not assessed secondary to lack of IV contrast. Unchanged patchy and confluent background of patchy and confluent T2 signal hyperintensity in the periventricular white matter. Extra axial spaces and basal cisterns are normal. Ventricles slightly prominent, likely secondary to mild central volume loss. Orbits, mastoid air cells are unremarkable. Evidence of right frontal craniotomy. Small mucous retention cysts in right maxillary sinus.  IMPRESSION: Interval increased masslike T2 to signal hyperintensity involving the right frontal lobe and anterior body of the corpus callosum. Enhancement characteristics were not assessed due to lack of IV contrast because of kidney function.     Electronically Reviewed by:  Ricardo Jericho, MD Electronically Reviewed on:  05/28/2014 3:35  PM  I have reviewed the images and concur with the above findings.  Electronically Signed by:  Dala Dock, MD Electronically Signed on:  05/28/2014 4:25 PM  Impression/Plan:  I spent about an hour meeting with Mr Darnell Level and his wife  The limited information from his MRI, combined with his main complaint of dizziness, and overall history and physical, lead me to conclude that it is worth starting a trial of Dexamethasone at modest dosing to see if this improves his quality of life.  We talked about the pros/cons of this.  We concluded to try 4mg  TID on day one, then 2mg  BID for a couple weeks.  They will call me on 2-24 to update me on how he is feeling.  I hope that this will decrease the edema in his brain and possibly help his symptoms but not cause excessive side effects given that he will not be on a very high dose.  We can then taper this (or allow other MDs to taper this) based on how he responds.  I will see him back for f/u, otherwise, in 3 months, sooner if needed. _____________________________________   Eppie Gibson, MD

## 2014-06-23 ENCOUNTER — Telehealth: Payer: Self-pay | Admitting: *Deleted

## 2014-06-23 NOTE — Telephone Encounter (Signed)
Called patient to inform of fu on 09-19-14 @ 11:20 am with Dr. Isidore Moos, spoke with patient's wife Magda Paganini) and she aware of this appt.

## 2014-07-02 ENCOUNTER — Telehealth: Payer: Self-pay | Admitting: Hematology & Oncology

## 2014-07-02 NOTE — Telephone Encounter (Signed)
[facility] LIFE INSURANCE CANCER FORMS completed and faxed to:   F: [phone] P: [phone]  Claim: [id number]     COPY SCANNED

## 2014-07-10 ENCOUNTER — Ambulatory Visit (HOSPITAL_BASED_OUTPATIENT_CLINIC_OR_DEPARTMENT_OTHER)
Admission: RE | Admit: 2014-07-10 | Discharge: 2014-07-10 | Disposition: A | Discharge status: Home / Self Care | Payer: BLUE CROSS/BLUE SHIELD | Source: Ambulatory Visit | Attending: Hematology & Oncology | Admitting: Hematology & Oncology

## 2014-07-10 ENCOUNTER — Encounter: Payer: Self-pay | Admitting: Hematology & Oncology

## 2014-07-10 ENCOUNTER — Other Ambulatory Visit: Payer: Self-pay | Admitting: Nurse Practitioner

## 2014-07-10 ENCOUNTER — Other Ambulatory Visit (HOSPITAL_BASED_OUTPATIENT_CLINIC_OR_DEPARTMENT_OTHER): Payer: BLUE CROSS/BLUE SHIELD | Admitting: Lab

## 2014-07-10 ENCOUNTER — Ambulatory Visit (HOSPITAL_BASED_OUTPATIENT_CLINIC_OR_DEPARTMENT_OTHER): Payer: BLUE CROSS/BLUE SHIELD | Admitting: Hematology & Oncology

## 2014-07-10 VITALS — BP 117/64 | HR 63 | Temp 98.6°F | Resp 20 | Ht 71.0 in | Wt 242.0 lb

## 2014-07-10 DIAGNOSIS — Z85841 Personal history of malignant neoplasm of brain: Secondary | ICD-10-CM | POA: Diagnosis not present

## 2014-07-10 DIAGNOSIS — I82401 Acute embolism and thrombosis of unspecified deep veins of right lower extremity: Secondary | ICD-10-CM

## 2014-07-10 DIAGNOSIS — I82431 Acute embolism and thrombosis of right popliteal vein: Secondary | ICD-10-CM | POA: Diagnosis not present

## 2014-07-10 DIAGNOSIS — M7989 Other specified soft tissue disorders: Secondary | ICD-10-CM

## 2014-07-10 DIAGNOSIS — Z86718 Personal history of other venous thrombosis and embolism: Secondary | ICD-10-CM

## 2014-07-10 DIAGNOSIS — C711 Malignant neoplasm of frontal lobe: Secondary | ICD-10-CM

## 2014-07-10 DIAGNOSIS — Z87891 Personal history of nicotine dependence: Secondary | ICD-10-CM | POA: Diagnosis not present

## 2014-07-10 LAB — CMP (CANCER CENTER ONLY)
ALT(SGPT): 23 U/L (ref 10–47)
AST: 22 U/L (ref 11–38)
Albumin: 3.1 g/dL — ABNORMAL LOW (ref 3.3–5.5)
Alkaline Phosphatase: 80 U/L (ref 26–84)
BILIRUBIN TOTAL: 0.6 mg/dL (ref 0.20–1.60)
BUN: 25 mg/dL — AB (ref 7–22)
CO2: 26 mEq/L (ref 18–33)
CREATININE: 1.9 mg/dL — AB (ref 0.6–1.2)
Calcium: 9 mg/dL (ref 8.0–10.3)
Chloride: 101 mEq/L (ref 98–108)
GLUCOSE: 127 mg/dL — AB (ref 73–118)
Potassium: 4.5 mEq/L (ref 3.3–4.7)
Sodium: 143 mEq/L (ref 128–145)
Total Protein: 6.3 g/dL — ABNORMAL LOW (ref 6.4–8.1)

## 2014-07-10 LAB — CBC WITH DIFFERENTIAL (CANCER CENTER ONLY)
BASO#: 0 10*3/uL (ref 0.0–0.2)
BASO%: 0.3 % (ref 0.0–2.0)
EOS%: 1.1 % (ref 0.0–7.0)
Eosinophils Absolute: 0.1 10*3/uL (ref 0.0–0.5)
HEMATOCRIT: 38 % — AB (ref 38.7–49.9)
HGB: 12.2 g/dL — ABNORMAL LOW (ref 13.0–17.1)
LYMPH#: 0.8 10*3/uL — ABNORMAL LOW (ref 0.9–3.3)
LYMPH%: 7.1 % — ABNORMAL LOW (ref 14.0–48.0)
MCH: 32.6 pg (ref 28.0–33.4)
MCHC: 32.1 g/dL (ref 32.0–35.9)
MCV: 102 fL — AB (ref 82–98)
MONO#: 0.6 10*3/uL (ref 0.1–0.9)
MONO%: 5.3 % (ref 0.0–13.0)
NEUT#: 9.8 10*3/uL — ABNORMAL HIGH (ref 1.5–6.5)
NEUT%: 86.2 % — ABNORMAL HIGH (ref 40.0–80.0)
Platelets: 265 10*3/uL (ref 145–400)
RBC: 3.74 10*6/uL — ABNORMAL LOW (ref 4.20–5.70)
RDW: 15 % (ref 11.1–15.7)
WBC: 11.3 10*3/uL — AB (ref 4.0–10.0)

## 2014-07-10 LAB — LACTATE DEHYDROGENASE: LDH: 196 U/L (ref 94–250)

## 2014-07-10 MED ORDER — TEMOZOLOMIDE 100 MG PO CAPS
ORAL_CAPSULE | ORAL | Status: DC
Start: 2014-07-10 — End: 2014-11-10

## 2014-07-10 MED ORDER — TEMOZOLOMIDE 100 MG PO CAPS: ORAL_CAPSULE | ORAL | Status: DC

## 2014-07-10 NOTE — Progress Notes (Signed)
Hematology and Oncology Follow Up Visit  Edward [name] [id number] 1945-09-15 69 y.o. 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  Temodar 100 mg by mouth daily-to start March 2  Patient to start Xarelto     Interim History:  Mr.  Edward [name] is back for follow-up. Unfortunately, it looks like that there may be some progression of disease. He saw his neuro-oncologist at [facility]. They did an MRI of his brain. This was just done last week. There appear to be some changes which suggested recurrence in the surgical bed. Apparently, there is some issues with the MRI because he cannot take full contrast due to his kidney function.  His neuro-oncologist, Dr. Simmie Davies, felt that he should go onto low-dose Temodar. She wanted him on 100 mg a day. He will start this on March 2. He completed his second cycle of full dose Temodar 2 weeks ago.  He actually looks quite good. He is back on some Decadron because of some cerebral edema. This hasn't affected him too badly.  He's had no seizures. His seizure medication has been adjusted.  With him him back on Decadron, his appetite is certainly quite vigorous.  He is doing some physical therapy.  There's been no cough. He's had no shortness of breath. He's had no issues with pain.  We did repeat a Doppler of his right leg. This did not show any residual thrombus. He is doing well with Xarelto. He will continue this.   Overall, his performance status is ECOG 2.   Medications:  Current outpatient prescriptions:  .  allopurinol (ZYLOPRIM) 100 MG tablet, Take 100 mg by mouth daily. , Disp: , Rfl:  .  atorvastatin (LIPITOR) 40 MG tablet, Take 40 mg by mouth daily. , Disp: , Rfl:  .  buPROPion (WELLBUTRIN XL) 300 MG 24 hr tablet, Take 300 mg by  mouth daily., Disp: , Rfl:  .  citalopram (CELEXA) 40 MG tablet, Take 1 tablet (40 mg total) by mouth daily., Disp: 30 tablet, Rfl: 2 .  dexamethasone (DECADRON) 2 MG tablet, Take 2 tablets three times a day on day 1, then 1 tab twice a day thereafter. Call Dr. Isidore Moos on 2/24 to update. (Patient taking differently: Take 2 mg by mouth 2 (two) times daily. ), Disp: 60 tablet, Rfl: 2 .  furosemide (LASIX) 40 MG tablet, Take 40 mg by mouth daily. , Disp: , Rfl:  .  KLOR-CON M20 20 MEQ tablet, Take 30 mEq by mouth daily. , Disp: , Rfl:  .  lacosamide (VIMPAT) 50 MG TABS tablet, Take 50 mg by mouth 2 (two) times daily. 2 tabs bid, Disp: , Rfl:  .  metoprolol succinate (TOPROL-XL) 100 MG 24 hr tablet, Take 100 mg by mouth daily. , Disp: , Rfl:  .  Multiple Vitamins-Minerals (MULTIVITAMIN PO), Take 1 tablet by mouth daily., Disp: , Rfl:  .  omeprazole (PRILOSEC) 40 MG capsule, Take 40 mg by mouth daily. , Disp: , Rfl:  .  ondansetron (ZOFRAN) 8 MG tablet, Take 1 pill 43min prior to Temodar, Disp: 30 tablet, Rfl: 3 .  rivaroxaban (XARELTO) 20 MG TABS tablet, Take 1 tablet (20 mg total) by mouth daily with supper. (Patient taking differently: Take 20 mg by mouth every morning. ), Disp: 30  tablet, Rfl: 12 .  sulfamethoxazole-trimethoprim (BACTRIM DS,SEPTRA DS) 800-160 MG per tablet, , Disp: , Rfl:  .  zolpidem (AMBIEN) 10 MG tablet, Take 10 mg by mouth at bedtime. , Disp: , Rfl:  .  HYDROcodone-acetaminophen (NORCO/VICODIN) 5-325 MG per tablet, Take 1 tablet by mouth every 6 (six) hours as needed. , Disp: , Rfl:  .  metolazone (ZAROXOLYN) 2.5 MG tablet, Take 1 tablet (2.5 mg total) by mouth daily. (Patient not taking: Reported on 06/20/2014), Disp: 30 tablet, Rfl: 4 .  temozolomide (TEMODAR) 100 MG capsule, Please take 1 pill a day at bedtime., Disp: 30 capsule, Rfl: 3  Allergies:  Allergies  Allergen Reactions  . Nsaids Other (See Comments)    Not good for kidney    Past Medical History, Surgical  history, Social history, and Family History were reviewed and updated.  Review of Systems: As above  Physical Exam:  height is 5\' 11"  (1.803 m) and weight is 242 lb (109.77 kg). His oral temperature is 98.6 F (37 C). His blood pressure is 117/64 and his pulse is 63. His respiration is 20.   Cushingoid appearing white male. He is in no distress. He is alert and oriented. Head and neck exam shows no adenopathy. He has no ocular or oral lesions. Pupils react appropriately. Lungs are clear. Cardiac exam regular rate and rhythm with no murmurs, rubs or bruits. Abdomen is soft, he is moderately obese. There is no fluid wave. There is no palpable liver or spleen tip. Extremities shows 1+ edema in his lower legs. There may be some slight increase in edema in the right leg. No palpable venous cord is noted. He has good range of motion of his joints. He has 4/5 strength in his lower legs. He has 4+/5 strength in his thighs. Neurological exam shows no focal neurological deficits. Back exam shows no tenderness over the spine, ribs or hips. Skin exam shows no rashes, or ecchymosis or petechia.  Lab Results  Component Value Date   WBC 11.3* 07/10/2014   HGB 12.2* 07/10/2014   HCT 38.0* 07/10/2014   MCV 102* 07/10/2014   PLT 265 07/10/2014     Chemistry      Component Value Date/Time   NA 143 07/10/2014 1420   NA 141 05/19/2014 1447   NA 138 02/28/2014 1048   K 4.5 07/10/2014 1420   K 4.3 05/19/2014 1447   K 4.6 02/28/2014 1048   CL 101 07/10/2014 1420   CL 103 05/19/2014 1447   CO2 26 07/10/2014 1420   CO2 25 05/19/2014 1248   CO2 22 02/28/2014 1048   BUN 25* 07/10/2014 1420   BUN 33* 05/19/2014 1447   BUN 53.2* 02/28/2014 1048   CREATININE 1.9* 07/10/2014 1420   CREATININE 2.50* 05/19/2014 1447   CREATININE 2.5* 02/28/2014 1048      Component Value Date/Time   CALCIUM 9.0 07/10/2014 1420   CALCIUM 9.0 05/19/2014 1248   CALCIUM 9.2 02/28/2014 1048   ALKPHOS 80 07/10/2014 1420    ALKPHOS 93 05/19/2014 1248   ALKPHOS 106 02/28/2014 1048   AST 22 07/10/2014 1420   AST 38* 05/19/2014 1248   AST 45* 02/28/2014 1048   ALT 23 07/10/2014 1420   ALT 46 05/19/2014 1248   ALT 118* 02/28/2014 1048   BILITOT 0.60 07/10/2014 1420   BILITOT 0.4 05/19/2014 1248   BILITOT 0.35 02/28/2014 1048         Impression and Plan: Mr. Nickson is 69 year old gentleman with a  partially resected glioblastoma of the right frontal lobe.  Of note, his tumor is not MGMT methylated. This typically indicates a more aggressive type tumor.  I guess I would not surprise me that he has recurrent disease. However, I does find it unusual that he is recommended to go on low-dose daily Temodar.  I'll have to speak with Dr. Simmie Davies as to why surgery was not entertained. It looks like there might be a recurrence in the surgical bed. I think he could get through surgery.  I spent about 45 minutes with he and his wife. I'm just glad that he is looking better. His performance status looks a little bit better.  I gave the prescription for the low-dose Temodar to my nurse so she can call this into the specialty pharmacy.  I want to see him back in about 3 weeks or so.Marland Kitchen  Volanda Napoleon, MD 2/25/20165:56 PM

## 2014-08-04 ENCOUNTER — Other Ambulatory Visit: Payer: Self-pay | Admitting: Hematology & Oncology

## 2014-08-07 ENCOUNTER — Other Ambulatory Visit: Payer: Self-pay | Admitting: *Deleted

## 2014-08-07 ENCOUNTER — Ambulatory Visit: Payer: BC Managed Care – PPO | Admitting: Hematology & Oncology

## 2014-08-07 ENCOUNTER — Other Ambulatory Visit: Payer: BC Managed Care – PPO | Admitting: Lab

## 2014-08-07 DIAGNOSIS — C711 Malignant neoplasm of frontal lobe: Secondary | ICD-10-CM

## 2014-08-08 ENCOUNTER — Other Ambulatory Visit (HOSPITAL_BASED_OUTPATIENT_CLINIC_OR_DEPARTMENT_OTHER): Payer: BLUE CROSS/BLUE SHIELD

## 2014-08-08 ENCOUNTER — Other Ambulatory Visit: Payer: Self-pay | Admitting: *Deleted

## 2014-08-08 ENCOUNTER — Ambulatory Visit (HOSPITAL_BASED_OUTPATIENT_CLINIC_OR_DEPARTMENT_OTHER): Payer: BLUE CROSS/BLUE SHIELD | Admitting: Hematology & Oncology

## 2014-08-08 VITALS — BP 146/81 | HR 56 | Temp 97.6°F | Resp 18 | Ht 70.0 in | Wt 242.0 lb

## 2014-08-08 DIAGNOSIS — C711 Malignant neoplasm of frontal lobe: Secondary | ICD-10-CM

## 2014-08-08 DIAGNOSIS — M7989 Other specified soft tissue disorders: Secondary | ICD-10-CM

## 2014-08-08 LAB — CBC WITH DIFFERENTIAL (CANCER CENTER ONLY)
BASO#: 0 10*3/uL (ref 0.0–0.2)
BASO%: 0.4 % (ref 0.0–2.0)
EOS ABS: 0.2 10*3/uL (ref 0.0–0.5)
EOS%: 1.8 % (ref 0.0–7.0)
HEMATOCRIT: 39.1 % (ref 38.7–49.9)
HGB: 12.6 g/dL — ABNORMAL LOW (ref 13.0–17.1)
LYMPH#: 1.6 10*3/uL (ref 0.9–3.3)
LYMPH%: 15.9 % (ref 14.0–48.0)
MCH: 31.7 pg (ref 28.0–33.4)
MCHC: 32.2 g/dL (ref 32.0–35.9)
MCV: 99 fL — AB (ref 82–98)
MONO#: 1 10*3/uL — AB (ref 0.1–0.9)
MONO%: 9.6 % (ref 0.0–13.0)
NEUT#: 7.2 10*3/uL — ABNORMAL HIGH (ref 1.5–6.5)
NEUT%: 72.3 % (ref 40.0–80.0)
Platelets: 240 10*3/uL (ref 145–400)
RBC: 3.97 10*6/uL — ABNORMAL LOW (ref 4.20–5.70)
RDW: 14.4 % (ref 11.1–15.7)
WBC: 10 10*3/uL (ref 4.0–10.0)

## 2014-08-08 LAB — COMPREHENSIVE METABOLIC PANEL
ALK PHOS: 84 U/L (ref 39–117)
ALT: 24 U/L (ref 0–53)
AST: 17 U/L (ref 0–37)
Albumin: 3.4 g/dL — ABNORMAL LOW (ref 3.5–5.2)
BUN: 30 mg/dL — AB (ref 6–23)
CO2: 25 mEq/L (ref 19–32)
Calcium: 8.8 mg/dL (ref 8.4–10.5)
Chloride: 106 mEq/L (ref 96–112)
Creatinine, Ser: 2.13 mg/dL — ABNORMAL HIGH (ref 0.50–1.35)
GLUCOSE: 76 mg/dL (ref 70–99)
Potassium: 3.8 mEq/L (ref 3.5–5.3)
Sodium: 144 mEq/L (ref 135–145)
Total Bilirubin: 0.4 mg/dL (ref 0.2–1.2)
Total Protein: 5.8 g/dL — ABNORMAL LOW (ref 6.0–8.3)

## 2014-08-08 LAB — LACTATE DEHYDROGENASE: LDH: 234 U/L (ref 94–250)

## 2014-08-08 MED ORDER — METOLAZONE 2.5 MG PO TABS: 2.5000 mg | ORAL_TABLET | Freq: Every day | ORAL | Status: DC

## 2014-08-08 NOTE — Progress Notes (Signed)
Hematology and Oncology Follow Up Visit  Edward [name] [id number] 1946-02-11 69 y.o. 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  Temodar 100 mg by mouth daily-to start March 2  Patient to start Xarelto     Interim History:  Mr.  Edward [name] is back for follow-up. Unfortunately, it looks like he may have another seizure. The sewing lasted a few minutes. He knew what was going on. His wife was there with him. He had no bowel or bladder incontinence. He had no chest pain. There is no nausea or vomiting.  He is on Vimpat to help with seizures.  He is not due for another MRI the brain until April 20.  He is doing okay on the Temodar at 100 mg daily.  He has more leg swelling. 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 His last Doppler showed resolution of the thrombus. I will see about getting him back onto Zaroxolyn along with his Lasix.  He went to a [employer] concert yesterday. He had a good time. He was fine talking to him about this.   His appetite has been doing pretty well. He's had no nausea or vomiting.  He continues on Decadron.  He's had no problems with bowels or bladder.  He actually looks quite good. He is back on some Decadron because of some cerebral edema. This hasn't affected him too badly.  He's had no seizures. His seizure medication has been adjusted.  Overall, his performance status is ECOG 2.   Medications:  Current outpatient prescriptions:  .  allopurinol (ZYLOPRIM) 100 MG tablet, Take 100 mg by mouth daily. , Disp: , Rfl:  .  atorvastatin (LIPITOR) 40 MG tablet, Take 40 mg by mouth daily. , Disp: , Rfl:  .  buPROPion (WELLBUTRIN XL) 300 MG 24 hr tablet, Take 300 mg by mouth daily., Disp: , Rfl:  .  citalopram  (CELEXA) 40 MG tablet, Take 1 tablet (40 mg total) by mouth daily., Disp: 30 tablet, Rfl: 2 .  dexamethasone (DECADRON) 2 MG tablet, Take 2 tablets three times a day on day 1, then 1 tab twice a day thereafter. Call Dr. Isidore [name] on 2/24 to update. (Patient taking differently: Take 2 mg by mouth 2 (two) times daily. ), Disp: 60 tablet, Rfl: 2 .  furosemide (LASIX) 40 MG tablet, Take 40 mg by mouth daily. , Disp: , Rfl:  .  HYDROcodone-acetaminophen (NORCO/VICODIN) 5-325 MG per tablet, Take 1 tablet by mouth every 6 (six) hours as needed. , Disp: , Rfl:  .  KLOR-CON M20 20 MEQ tablet, Take 30 mEq by mouth daily. , Disp: , Rfl:  .  lacosamide (VIMPAT) 50 MG TABS tablet, Take 50 mg by mouth 2 (two) times daily. 2 tabs bid, Disp: , Rfl:  .  metoprolol succinate (TOPROL-XL) 100 MG 24 hr tablet, Take 100 mg by mouth daily. , Disp: , Rfl:  .  Multiple Vitamins-Minerals (MULTIVITAMIN PO), Take 1 tablet by mouth daily., Disp: , Rfl:  .  omeprazole (PRILOSEC) 40 MG capsule, Take 40 mg by mouth daily. , Disp: , Rfl:  .  rivaroxaban (XARELTO) 20 MG TABS tablet, Take 1 tablet (20 mg total) by mouth daily with supper. (Patient taking differently: Take 20 mg by  mouth every morning. ), Disp: 30 tablet, Rfl: 12 .  sulfamethoxazole-trimethoprim (BACTRIM DS,SEPTRA DS) 800-160 MG per tablet, TAKE ONE TABLET BY MOUTH ONCE DAILY, Disp: 30 tablet, Rfl: 0 .  temozolomide (TEMODAR) 100 MG capsule, Please take 1 pill a day at bedtime., Disp: 30 capsule, Rfl: 3 .  zolpidem (AMBIEN) 10 MG tablet, Take 10 mg by mouth at bedtime. , Disp: , Rfl:  .  metolazone (ZAROXOLYN) 2.5 MG tablet, Take 1 tablet (2.5 mg total) by mouth daily., Disp: 30 tablet, Rfl: 4 .  ondansetron (ZOFRAN) 8 MG tablet, Take 1 pill 57min prior to Temodar (Patient not taking: Reported on 08/08/2014), Disp: 30 tablet, Rfl: 3  Allergies:  Allergies  Allergen Reactions  . Nsaids Other (See Comments)    Not good for kidney    Past Medical History, Surgical  history, Social history, and Family History were reviewed and updated.  Review of Systems: As above  Physical Exam:  height is 5\' 10"  (1.778 m) and weight is 242 lb (109.77 kg). His oral temperature is 97.6 F (36.4 C). His blood pressure is 146/81 and his pulse is 56. His respiration is 18.   Cushingoid appearing white male. He is in no distress. He is alert and oriented. Head and neck exam shows no adenopathy. He has no ocular or oral lesions. Pupils react appropriately. Lungs are clear. Cardiac exam regular rate and rhythm with no murmurs, rubs or bruits. Abdomen is soft, he is moderately obese. There is no fluid wave. There is no palpable liver or spleen tip. Extremities shows 1+ edema in his lower legs. There may be some slight increase in edema in the right leg. No palpable venous cord is noted. He has good range of motion of his joints. He has 4/5 strength in his lower legs. He has 4+/5 strength in his thighs. Neurological exam shows no focal neurological deficits. Back exam shows no tenderness over the spine, ribs or hips. Skin exam shows no rashes, or ecchymosis or petechia.  Lab Results  Component Value Date   WBC 10.0 08/08/2014   HGB 12.6* 08/08/2014   HCT 39.1 08/08/2014   MCV 99* 08/08/2014   PLT 240 08/08/2014     Chemistry      Component Value Date/Time   NA 144 08/08/2014 1117   NA 143 07/10/2014 1420   NA 138 02/28/2014 1048   K 3.8 08/08/2014 1117   K 4.5 07/10/2014 1420   K 4.6 02/28/2014 1048   CL 106 08/08/2014 1117   CL 101 07/10/2014 1420   CO2 25 08/08/2014 1117   CO2 26 07/10/2014 1420   CO2 22 02/28/2014 1048   BUN 30* 08/08/2014 1117   BUN 25* 07/10/2014 1420   BUN 53.2* 02/28/2014 1048   CREATININE 2.13* 08/08/2014 1117   CREATININE 1.9* 07/10/2014 1420   CREATININE 2.5* 02/28/2014 1048      Component Value Date/Time   CALCIUM 8.8 08/08/2014 1117   CALCIUM 9.0 07/10/2014 1420   CALCIUM 9.2 02/28/2014 1048   ALKPHOS 84 08/08/2014 1117   ALKPHOS  80 07/10/2014 1420   ALKPHOS 106 02/28/2014 1048   AST 17 08/08/2014 1117   AST 22 07/10/2014 1420   AST 45* 02/28/2014 1048   ALT 24 08/08/2014 1117   ALT 23 07/10/2014 1420   ALT 118* 02/28/2014 1048   BILITOT 0.4 08/08/2014 1117   BILITOT 0.60 07/10/2014 1420   BILITOT 0.35 02/28/2014 1048         Impression and  Plan: Mr. Boissonneault is 69 year old gentleman with a partially resected glioblastoma of the right frontal lobe.  Of note, his tumor is not MGMT methylated. This typically indicates a more aggressive type tumor.  I guess I would not surprise me that he has recurrent disease. Next MRI will be very critical.  I did speak with Dr. Simmie Davies regarding the situation. She said that they are not convinced that he has progression and that with air seen might be pseudo-progression which can be seen following radiation. Again, the next scan will be very important.  I spent about 45 minutes with he and his wife. I'm just glad that he is looking better. His performance status looks a little bit better.  I want to see him back in about 3 weeks or so.Marland Kitchen    Volanda Napoleon, MD 3/25/20164:28 PM

## 2014-09-04 ENCOUNTER — Ambulatory Visit (HOSPITAL_BASED_OUTPATIENT_CLINIC_OR_DEPARTMENT_OTHER): Payer: BLUE CROSS/BLUE SHIELD | Admitting: Hematology & Oncology

## 2014-09-04 ENCOUNTER — Other Ambulatory Visit (HOSPITAL_BASED_OUTPATIENT_CLINIC_OR_DEPARTMENT_OTHER): Payer: BLUE CROSS/BLUE SHIELD

## 2014-09-04 ENCOUNTER — Encounter: Payer: Self-pay | Admitting: Hematology & Oncology

## 2014-09-04 VITALS — BP 136/72 | HR 61 | Temp 98.4°F | Resp 20 | Ht 69.0 in | Wt 231.0 lb

## 2014-09-04 DIAGNOSIS — M7989 Other specified soft tissue disorders: Secondary | ICD-10-CM | POA: Diagnosis not present

## 2014-09-04 DIAGNOSIS — C711 Malignant neoplasm of frontal lobe: Secondary | ICD-10-CM

## 2014-09-04 DIAGNOSIS — B370 Candidal stomatitis: Secondary | ICD-10-CM

## 2014-09-04 DIAGNOSIS — I10 Essential (primary) hypertension: Secondary | ICD-10-CM

## 2014-09-04 LAB — COMPREHENSIVE METABOLIC PANEL
ALK PHOS: 76 U/L (ref 39–117)
ALT: 25 U/L (ref 0–53)
AST: 21 U/L (ref 0–37)
Albumin: 3.5 g/dL (ref 3.5–5.2)
BUN: 42 mg/dL — AB (ref 6–23)
CO2: 30 mEq/L (ref 19–32)
CREATININE: 2.65 mg/dL — AB (ref 0.50–1.35)
Calcium: 9.2 mg/dL (ref 8.4–10.5)
Chloride: 95 mEq/L — ABNORMAL LOW (ref 96–112)
Glucose, Bld: 100 mg/dL — ABNORMAL HIGH (ref 70–99)
Potassium: 3.3 mEq/L — ABNORMAL LOW (ref 3.5–5.3)
Sodium: 139 mEq/L (ref 135–145)
Total Bilirubin: 0.5 mg/dL (ref 0.2–1.2)
Total Protein: 6.1 g/dL (ref 6.0–8.3)

## 2014-09-04 LAB — CBC WITH DIFFERENTIAL (CANCER CENTER ONLY)
BASO#: 0 10*3/uL (ref 0.0–0.2)
BASO%: 0.3 % (ref 0.0–2.0)
EOS%: 1.1 % (ref 0.0–7.0)
Eosinophils Absolute: 0.2 10*3/uL (ref 0.0–0.5)
HCT: 39.5 % (ref 38.7–49.9)
HGB: 13.1 g/dL (ref 13.0–17.1)
LYMPH#: 0.9 10*3/uL (ref 0.9–3.3)
LYMPH%: 6.8 % — ABNORMAL LOW (ref 14.0–48.0)
MCH: 30.9 pg (ref 28.0–33.4)
MCHC: 33.2 g/dL (ref 32.0–35.9)
MCV: 93 fL (ref 82–98)
MONO#: 0.7 10*3/uL (ref 0.1–0.9)
MONO%: 5.1 % (ref 0.0–13.0)
NEUT#: 11.6 10*3/uL — ABNORMAL HIGH (ref 1.5–6.5)
NEUT%: 86.7 % — AB (ref 40.0–80.0)
Platelets: 269 10*3/uL (ref 145–400)
RBC: 4.24 10*6/uL (ref 4.20–5.70)
RDW: 14.6 % (ref 11.1–15.7)
WBC: 13.4 10*3/uL — ABNORMAL HIGH (ref 4.0–10.0)

## 2014-09-04 LAB — LACTATE DEHYDROGENASE: LDH: 238 U/L (ref 94–250)

## 2014-09-04 MED ORDER — FLUCONAZOLE 100 MG PO TABS: 100.0000 mg | ORAL_TABLET | Freq: Every day | ORAL | Status: DC

## 2014-09-04 NOTE — Progress Notes (Signed)
Hematology and Oncology Follow Up Visit  Edward [name] [id number] December 08, 1945 69 y.o. 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  Temodar 100 mg by mouth daily-to start March 2  Patient to start Xarelto     Interim History:  Edward [name] is back for follow-up. Unfortunately, he does not look as good. He is in a wheelchair. He's had a hard time with weakness on the left side.  She is not as swollen in his legs. However, he had a bump in his creatinine. He was on Zaroxolyn. We will keep him off Zaroxolyn.  He is going to try some physical therapy again. I think this would be reasonable.  He was seen at [facility] earlier this week. He did have an MRI done. The radiologist could not be certain that his cancer was progressing versus radiation changes. His oncologist at [facility] has Him on the same Temodar program for right now. She wants to get another MRI in June.  He did have a seizure while at [facility]. No change was made in his medications. He continues on Decadron. We will taper this down. The physicians at [facility] feel this would be reasonable.  He still is eating okay. He's had no nausea or vomiting.  He does have a little bit of thrush. We will give him some Diflucan.  He's had no problems with bowels or bladder.  Overall, his performance status is ECOG 2.   Medications:  Current outpatient prescriptions:  .  allopurinol (ZYLOPRIM) 100 MG tablet, Take 100 mg by mouth daily. , Disp: , Rfl:  .  buPROPion (WELLBUTRIN XL) 300 MG 24 hr tablet, Take 300 mg by mouth daily., Disp: , Rfl:  .  citalopram (CELEXA) 40 MG tablet, Take 1 tablet (40 mg total) by mouth daily., Disp: 30 tablet, Rfl: 2 .  dexamethasone (DECADRON) 2 MG tablet, Take 2 tablets three times a day on day 1, then 1 tab  twice a day thereafter. Call Dr. Isidore Moos on 2/24 to update. (Patient taking differently: Take 2 mg by mouth 2 (two) times daily. STARTED TO WEAN OFF, PER DUKE MD), Disp: 60 tablet, Rfl: 2 .  docusate sodium (COLACE) 100 MG capsule, Take 100 mg by mouth daily as needed for mild constipation., Disp: , Rfl:  .  furosemide (LASIX) 40 MG tablet, Take 60 mg by mouth daily. , Disp: , Rfl:  .  HYDROcodone-acetaminophen (NORCO/VICODIN) 5-325 MG per tablet, Take 1 tablet by mouth every 6 (six) hours as needed. , Disp: , Rfl:  .  KLOR-CON M20 20 MEQ tablet, Take 40 mEq by mouth daily. TAKING 80 MG DAILY PER DUKE MD, Disp: , Rfl:  .  lacosamide (VIMPAT) 50 MG TABS tablet, Take 50 mg by mouth 2 (two) times daily. 2 tabs bid, Disp: , Rfl:  .  LORazepam (ATIVAN) 0.5 MG tablet, Place one tablet under tongue at onset of seizure. May repeat x 1 in 10 minutes., Disp: , Rfl:  .  metoprolol succinate (TOPROL-XL) 100 MG 24 hr tablet, Take 100 mg by mouth daily. , Disp: , Rfl:  .  Multiple Vitamins-Minerals (MULTIVITAMIN PO), Take 1 tablet by mouth daily., Disp: , Rfl:  .  nystatin (MYCOSTATIN) 100000 UNIT/ML suspension, Swish and swallow 5 mLs 4 (four) times daily.,  Disp: , Rfl:  .  omeprazole (PRILOSEC) 40 MG capsule, Take 40 mg by mouth daily. , Disp: , Rfl:  .  ondansetron (ZOFRAN) 8 MG tablet, Take 1 pill 41min prior to Temodar, Disp: 30 tablet, Rfl: 3 .  rivaroxaban (XARELTO) 20 MG TABS tablet, Take 1 tablet (20 mg total) by mouth daily with supper. (Patient taking differently: Take 20 mg by mouth every morning. ), Disp: 30 tablet, Rfl: 12 .  sulfamethoxazole-trimethoprim (BACTRIM DS,SEPTRA DS) 800-160 MG per tablet, TAKE ONE TABLET BY MOUTH ONCE DAILY (Patient taking differently: TAKES 1/2 TAB), Disp: 30 tablet, Rfl: 0 .  temozolomide (TEMODAR) 100 MG capsule, Please take 1 pill a day at bedtime., Disp: 30 capsule, Rfl: 3 .  zolpidem (AMBIEN) 10 MG tablet, Take 10 mg by mouth at bedtime. , Disp: , Rfl:  .   atorvastatin (LIPITOR) 40 MG tablet, Take 40 mg by mouth daily. , Disp: , Rfl:  .  fluconazole (DIFLUCAN) 100 MG tablet, Take 1 tablet (100 mg total) by mouth daily., Disp: 30 tablet, Rfl: 4  Allergies:  Allergies  Allergen Reactions  . Nsaids Other (See Comments)    Not good for kidney    Past Medical History, Surgical history, Social history, and Family History were reviewed and updated.  Review of Systems: As above  Physical Exam:  height is 5\' 9"  (1.753 m) and weight is 231 lb (104.781 kg). His oral temperature is 98.4 F (36.9 C). His blood pressure is 136/72 and his pulse is 61. His respiration is 20.   Cushingoid appearing white male. He is in no distress. He is alert and oriented. Head and neck exam shows no adenopathy. He has no ocular or oral lesions. Pupils react appropriately. Lungs are clear. Cardiac exam regular rate and rhythm with no murmurs, rubs or bruits. Abdomen is soft, he is moderately obese. There is no fluid wave. There is no palpable liver or spleen tip. Extremities shows 1+ edema in his lower legs. There may be some slight increase in edema in the right leg. No palpable venous cord is noted. He has good range of motion of his joints. He has 4/5 strength in his lower legs. He has 2+ strength in his thighs. Neurological exam shows no focal neurological deficits. Back exam shows no tenderness over the spine, ribs or hips. Skin exam shows no rashes, or ecchymosis or petechia.  Lab Results  Component Value Date   WBC 13.4* 09/04/2014   HGB 13.1 09/04/2014   HCT 39.5 09/04/2014   MCV 93 09/04/2014   PLT 269 09/04/2014     Chemistry      Component Value Date/Time   NA 144 08/08/2014 1117   NA 143 07/10/2014 1420   NA 138 02/28/2014 1048   K 3.8 08/08/2014 1117   K 4.5 07/10/2014 1420   K 4.6 02/28/2014 1048   CL 106 08/08/2014 1117   CL 101 07/10/2014 1420   CO2 25 08/08/2014 1117   CO2 26 07/10/2014 1420   CO2 22 02/28/2014 1048   BUN 30* 08/08/2014 1117    BUN 25* 07/10/2014 1420   BUN 53.2* 02/28/2014 1048   CREATININE 2.13* 08/08/2014 1117   CREATININE 1.9* 07/10/2014 1420   CREATININE 2.5* 02/28/2014 1048      Component Value Date/Time   CALCIUM 8.8 08/08/2014 1117   CALCIUM 9.0 07/10/2014 1420   CALCIUM 9.2 02/28/2014 1048   ALKPHOS 84 08/08/2014 1117   ALKPHOS 80 07/10/2014 1420   ALKPHOS 106  02/28/2014 1048   AST 17 08/08/2014 1117   AST 22 07/10/2014 1420   AST 45* 02/28/2014 1048   ALT 24 08/08/2014 1117   ALT 23 07/10/2014 1420   ALT 118* 02/28/2014 1048   BILITOT 0.4 08/08/2014 1117   BILITOT 0.60 07/10/2014 1420   BILITOT 0.35 02/28/2014 1048         Impression and Plan: Edward [name] is 69 year old gentleman with a partially resected glioblastoma of the right frontal lobe.  Of note, his tumor is not MGMT methylated. This typically indicates a more aggressive type tumor.  From my point of view, it certainly appears that he is progressing. He just has more neurological issues. He's got more weakness on his left side. I can understand the dilemma that the physicians at [facility] have.  I think that if he does have confirmed progression, then his treatment will clearly have to be changed. Since he goes to [facility], I would think that there would be some type of clinical trial that he might qualify for. If there is any, then I would think that irinotecan with Avastin would be reasonable.  I forgot to mention that he is still on any coagulation for his DVT. He is doing well with this.  I spent about 45 minutes with he and his wife. It is noisy clearly complicated when we see him because all the issues that he is dealing with.  I want to see him back in about 2 weeks or so.Marland Kitchen    Volanda Napoleon, MD 4/21/20165:38 PM

## 2014-09-05 ENCOUNTER — Telehealth: Payer: Self-pay | Admitting: *Deleted

## 2014-09-05 NOTE — Telephone Encounter (Addendum)
Patient's wife took the message because patient was still sleeping. Appreciative of results.   ----- Message from Volanda Napoleon, MD sent at 09/05/2014  7:29 AM EDT ----- Call - creatinine is on the high side but stable.  pete

## 2014-09-12 ENCOUNTER — Emergency Department (HOSPITAL_COMMUNITY)
Admission: EM | Admit: 2014-09-12 | Discharge: 2014-09-12 | Disposition: A | Discharge status: Home / Self Care | Payer: BLUE CROSS/BLUE SHIELD | Attending: Emergency Medicine | Admitting: Emergency Medicine

## 2014-09-12 ENCOUNTER — Emergency Department (HOSPITAL_COMMUNITY): Payer: BLUE CROSS/BLUE SHIELD

## 2014-09-12 ENCOUNTER — Encounter (HOSPITAL_COMMUNITY): Payer: Self-pay | Admitting: Emergency Medicine

## 2014-09-12 DIAGNOSIS — Z923 Personal history of irradiation: Secondary | ICD-10-CM | POA: Diagnosis not present

## 2014-09-12 DIAGNOSIS — Z7901 Long term (current) use of anticoagulants: Secondary | ICD-10-CM | POA: Diagnosis not present

## 2014-09-12 DIAGNOSIS — Z79899 Other long term (current) drug therapy: Secondary | ICD-10-CM | POA: Insufficient documentation

## 2014-09-12 DIAGNOSIS — Z8639 Personal history of other endocrine, nutritional and metabolic disease: Secondary | ICD-10-CM | POA: Diagnosis not present

## 2014-09-12 DIAGNOSIS — C711 Malignant neoplasm of frontal lobe: Secondary | ICD-10-CM

## 2014-09-12 DIAGNOSIS — F329 Major depressive disorder, single episode, unspecified: Secondary | ICD-10-CM | POA: Insufficient documentation

## 2014-09-12 DIAGNOSIS — G4733 Obstructive sleep apnea (adult) (pediatric): Secondary | ICD-10-CM | POA: Insufficient documentation

## 2014-09-12 DIAGNOSIS — I10 Essential (primary) hypertension: Secondary | ICD-10-CM | POA: Diagnosis not present

## 2014-09-12 DIAGNOSIS — Z720 Tobacco use: Secondary | ICD-10-CM | POA: Insufficient documentation

## 2014-09-12 DIAGNOSIS — Z9981 Dependence on supplemental oxygen: Secondary | ICD-10-CM | POA: Diagnosis not present

## 2014-09-12 DIAGNOSIS — R569 Unspecified convulsions: Secondary | ICD-10-CM | POA: Insufficient documentation

## 2014-09-12 DIAGNOSIS — Z86011 Personal history of benign neoplasm of the brain: Secondary | ICD-10-CM | POA: Insufficient documentation

## 2014-09-12 DIAGNOSIS — Z85528 Personal history of other malignant neoplasm of kidney: Secondary | ICD-10-CM | POA: Diagnosis not present

## 2014-09-12 DIAGNOSIS — I82401 Acute embolism and thrombosis of unspecified deep veins of right lower extremity: Secondary | ICD-10-CM

## 2014-09-12 DIAGNOSIS — M7989 Other specified soft tissue disorders: Secondary | ICD-10-CM

## 2014-09-12 DIAGNOSIS — Z87898 Personal history of other specified conditions: Secondary | ICD-10-CM

## 2014-09-12 LAB — CBC WITH DIFFERENTIAL/PLATELET
Basophils Absolute: 0 10*3/uL (ref 0.0–0.1)
Basophils Relative: 0 % (ref 0–1)
Eosinophils Absolute: 0 10*3/uL (ref 0.0–0.7)
Eosinophils Relative: 0 % (ref 0–5)
HCT: 36.3 % — ABNORMAL LOW (ref 39.0–52.0)
Hemoglobin: 11.6 g/dL — ABNORMAL LOW (ref 13.0–17.0)
Lymphocytes Relative: 11 % — ABNORMAL LOW (ref 12–46)
Lymphs Abs: 1.1 10*3/uL (ref 0.7–4.0)
MCH: 30.5 pg (ref 26.0–34.0)
MCHC: 32 g/dL (ref 30.0–36.0)
MCV: 95.5 fL (ref 78.0–100.0)
Monocytes Absolute: 0.8 10*3/uL (ref 0.1–1.0)
Monocytes Relative: 8 % (ref 3–12)
Neutro Abs: 8.6 10*3/uL — ABNORMAL HIGH (ref 1.7–7.7)
Neutrophils Relative %: 81 % — ABNORMAL HIGH (ref 43–77)
Platelets: 252 10*3/uL (ref 150–400)
RBC: 3.8 MIL/uL — ABNORMAL LOW (ref 4.22–5.81)
RDW: 15.4 % (ref 11.5–15.5)
WBC: 10.5 10*3/uL (ref 4.0–10.5)

## 2014-09-12 LAB — BASIC METABOLIC PANEL
Anion gap: 6 (ref 5–15)
BUN: 38 mg/dL — ABNORMAL HIGH (ref 6–23)
CO2: 26 mmol/L (ref 19–32)
Calcium: 8.6 mg/dL (ref 8.4–10.5)
Chloride: 105 mmol/L (ref 96–112)
Creatinine, Ser: 2.48 mg/dL — ABNORMAL HIGH (ref 0.50–1.35)
GFR calc Af Amer: 30 mL/min — ABNORMAL LOW (ref >90)
GFR calc non Af Amer: 25 mL/min — ABNORMAL LOW (ref >90)
Glucose, Bld: 102 mg/dL — ABNORMAL HIGH (ref 70–99)
Potassium: 4.4 mmol/L (ref 3.5–5.1)
Sodium: 137 mmol/L (ref 135–145)

## 2014-09-12 MED ORDER — CLONAZEPAM 0.5 MG PO TABS: 1.0000 mg | ORAL_TABLET | Freq: Once | ORAL | Status: DC

## 2014-09-12 MED ORDER — LACOSAMIDE 50 MG PO TABS: 150.0000 mg | ORAL_TABLET | Freq: Two times a day (BID) | ORAL | Status: AC

## 2014-09-12 NOTE — ED Provider Notes (Signed)
CSN: [id number]     Arrival date & time 09/12/14  1945 History   First MD Initiated Contact with Patient 09/12/14 2002     Chief Complaint  Patient presents with  . Seizures     (Consider location/radiation/quality/duration/timing/severity/associated sxs/prior Treatment) HPI  Pt is a 69yo male with hx of HTN, hypercholesteremia, depression, renal cancer, s/p radiation therapy 02/06/14-03/30/14 for Right frontal low tumor, presenting to ED with reports of a 2 minute grand-mal seizure while eating at a restaurant.  Pt reports this was his second episode today, first one was aroun 1600.  Family was with pt during seizure. Pt was seated both times. He did not fall or hit his head.  Family member a nurse at the scene was concerned pt aspirated as he was eating during seizure, however, pt denies CP, SOB or cough since incident.  Denies oral injury or incontinence with seizures.  Pt is followed by [facility].  Reports having a f/u visit last week. He did have 1 seizure at that appointment, was advised to let his provider know if seizures became more frequent or severe.  No changes in medication,  Vimpat, were made at that time.  Denies fever, chills, n/v/d.  Reports having 2-3 seizures per month but denies every having multiple seizures in the same day.  Past Medical History  Diagnosis Date  . HTN (hypertension)   . Hypercholesteremia   . Depression   . Cancer     Renal  . H/O diplopia 01/10/2013  . OSA on CPAP 04/08/2013  . S/P radiation therapy 02/06/2014-03/20/2014    1) Right frontal lobe tumor bed initial / 46 Gy in 23 fractions/ 2) Right frontal lobe tumor bed boost / 14 Gy in 7 fractions     Past Surgical History  Procedure Laterality Date  . Kidney surgery  2009  . Penile prosthesis implant  2011  . Craniotomy N/A 01/01/2014    Procedure: CRANIOTOMY FOR TUMOR EXCISION;  Surgeon: Newman Pies, MD;  Location: [facility];  Service: Neurosurgery;  Laterality: N/A;  Craniotomy for  tumor resection with stealth   Family History  Problem Relation Age of Onset  . Cancer Mother   . Heart failure Father    History  Substance Use Topics  . Smoking status: Current Every Day Smoker -- 1.00 packs/day for 53 years    Types: Cigarettes    Start date: 08/28/1960  . Smokeless tobacco: Never Used     Comment: still smoking , 09-04-14  . Alcohol Use: No    Review of Systems  Constitutional: Negative for chills, appetite change and fatigue.  Eyes: Negative for photophobia, pain, redness and visual disturbance.  Gastrointestinal: Negative for nausea, vomiting, abdominal pain and diarrhea.  Neurological: Positive for seizures. Negative for dizziness, tremors, syncope, facial asymmetry, speech difficulty, weakness, light-headedness, numbness and headaches.  All other systems reviewed and are negative.     Allergies  Nsaids  Home Medications   Prior to Admission medications   Medication Sig Start Date End Date Taking? Authorizing Provider  allopurinol (ZYLOPRIM) 100 MG tablet Take 100 mg by mouth daily.  11/15/12  Yes Historical Provider, MD  buPROPion (WELLBUTRIN XL) 300 MG 24 hr tablet Take 300 mg by mouth daily.   Yes Historical Provider, MD  citalopram (CELEXA) 40 MG tablet Take 1 tablet (40 mg total) by mouth daily. 01/06/14  Yes Reyne Dumas, MD  dexamethasone (DECADRON) 2 MG tablet Take 2 tablets three times a day on day 1,  then 1 tab twice a day thereafter. Call Dr. Isidore Moos on 2/24 to update. Patient taking differently: Take 3 mg by mouth daily.  06/20/14  Yes Eppie Gibson, MD  docusate sodium (COLACE) 100 MG capsule Take 100 mg by mouth daily as needed for mild constipation.   Yes Historical Provider, MD  fluconazole (DIFLUCAN) 100 MG tablet Take 1 tablet (100 mg total) by mouth daily. 09/04/14  Yes Volanda Napoleon, MD  furosemide (LASIX) 40 MG tablet Take 60 mg by mouth daily.    Yes Historical Provider, MD  HYDROcodone-acetaminophen (NORCO/VICODIN) 5-325 MG per tablet  Take 1 tablet by mouth every 6 (six) hours as needed.    Yes Historical Provider, MD  KLOR-CON M20 20 MEQ tablet Take 30 mEq by mouth 2 (two) times daily. Patient is currently taking 1.5 tablets in the morning and 1.5 tablets at night (20MEQ tablets) 11/25/13  Yes Historical Provider, MD  metoprolol succinate (TOPROL-XL) 100 MG 24 hr tablet Take 100 mg by mouth daily.  12/10/12  Yes Historical Provider, MD  Multiple Vitamins-Minerals (MULTIVITAMIN PO) Take 1 tablet by mouth daily.   Yes Historical Provider, MD  omeprazole (PRILOSEC) 40 MG capsule Take 40 mg by mouth daily.  10/14/12  Yes Historical Provider, MD  ondansetron (ZOFRAN) 8 MG tablet Take 1 pill 53min prior to Temodar 01/28/14  Yes Volanda Napoleon, MD  rivaroxaban (XARELTO) 20 MG TABS tablet Take 1 tablet (20 mg total) by mouth daily with supper. Patient taking differently: Take 20 mg by mouth every morning.  05/07/14  Yes Volanda Napoleon, MD  sulfamethoxazole-trimethoprim (BACTRIM DS,SEPTRA DS) 800-160 MG per tablet TAKE ONE TABLET BY MOUTH ONCE DAILY Patient taking differently: TAKES 1/2 TAB 08/04/14  Yes Volanda Napoleon, MD  temozolomide (TEMODAR) 100 MG capsule Please take 1 pill a day at bedtime. 07/10/14  Yes Volanda Napoleon, MD  zolpidem (AMBIEN) 10 MG tablet Take 10 mg by mouth at bedtime as needed for sleep.  12/17/12  Yes Historical Provider, MD  lacosamide (VIMPAT) 50 MG TABS tablet Take 3 tablets (150 mg total) by mouth 2 (two) times daily. 09/12/14 09/12/15  Noland Fordyce, PA-C  LORazepam (ATIVAN) 0.5 MG tablet Place one tablet under tongue at onset of seizure. May repeat x 1 in 10 minutes. 09/03/14   Historical Provider, MD   BP 148/82 mmHg  Pulse 57  Temp(Src) 98.8 F (37.1 C) (Oral)  Resp 16  SpO2 93% Physical Exam  Constitutional: He is oriented to person, place, and time. He appears well-developed and well-nourished.  HENT:  Head: Normocephalic and atraumatic.  Right Ear: Hearing, tympanic membrane, external ear and ear  canal normal.  Left Ear: Hearing, tympanic membrane, external ear and ear canal normal.  Nose: Nose normal.  Mouth/Throat: Uvula is midline, oropharynx is clear and moist and mucous membranes are normal.  Eyes: Conjunctivae and EOM are normal. Pupils are equal, round, and reactive to light. No scleral icterus.  Neck: Normal range of motion. Neck supple.  Cardiovascular: Normal rate, regular rhythm and normal heart sounds.   Pulmonary/Chest: Effort normal and breath sounds normal. No respiratory distress. He has no wheezes. He has no rales. He exhibits no tenderness.  Abdominal: Soft. Bowel sounds are normal. He exhibits no distension and no mass. There is no tenderness. There is no rebound and no guarding.  Musculoskeletal: Normal range of motion. He exhibits no tenderness.  Neurological: He is alert and oriented to person, place, and time. No cranial nerve deficit. Coordination  normal.  CN II-XII in tact. Speech is clear. Able to follow 2 step commands.  FROM upper and lower extremities with 5/5 strength bilateral.   Skin: Skin is warm and dry.  Nursing note and vitals reviewed.   ED Course  Procedures (including critical care time) Labs Review Labs Reviewed  CBC WITH DIFFERENTIAL/PLATELET - Abnormal; Notable for the following:    RBC 3.80 (*)    Hemoglobin 11.6 (*)    HCT 36.3 (*)    Neutrophils Relative % 81 (*)    Neutro Abs 8.6 (*)    Lymphocytes Relative 11 (*)    All other components within normal limits  BASIC METABOLIC PANEL - Abnormal; Notable for the following:    Glucose, Bld 102 (*)    BUN 38 (*)    Creatinine, Ser 2.48 (*)    GFR calc non Af Amer 25 (*)    GFR calc Af Amer 30 (*)    All other components within normal limits    Imaging Review Dg Chest 2 View  09/12/2014   CLINICAL DATA:  Seizures  EXAM: CHEST  2 VIEW  COMPARISON:  05/19/2014  FINDINGS: Mild left basilar atelectasis. Lungs are otherwise clear. No pleural effusion or pneumothorax.  Heart is normal in  size.  Degenerative changes of the visualized thoracolumbar spine.  IMPRESSION: No evidence of acute cardiopulmonary disease.   Electronically Signed   By: Julian Hy M.D.   On: 09/12/2014 21:51     EKG Interpretation None      MDM   Final diagnoses:  Seizures  History of brain tumor    Pt with hx of brain tumor, presenting to ED with reports of 2 seizures this evening. Last seizure occurred while pt was eating, will get CXR to ensure no aspiration, however, low concern as pt denies CP, SOB or cough.  Labs: c/w previous   CXR: no acute cardiopulmonary disease.  Vitals: WNL   10:36 PM Consulted with Dr. Leonel Ramsay, neurology, recommend pt increased dose of Vimpat to 150mg  BID as pt currently taking 100mg  BID.  Recommended giving pt 1mg  ativan or clonazepam to lower seizure threshold until Vimpat concentration builds up.   Discussed this tx course with pt and family. Family reports having difficulty getting pt into bed as pt has difficulty ambulating on his own and request to given 1mg  ativan at home once pt is in his bed as they know it will make him tired and more difficult to get into bed.  Pt currently has ativan at home.  Advised pt and family this is okay to do.   Strict return precautions given. Advised to call [facility] tomorrow to make sure his physicians are up to date on pt's new dose of Vimpat. Pt and family verbalized understanding and agreement with tx plan.    Noland Fordyce, PA-C 09/12/14 [place], PA-C 09/12/14 2300  Wandra Arthurs, MD 09/13/14 [phone]

## 2014-09-12 NOTE — ED Notes (Signed)
Brought in by EMS from a restaurant after his seizure episode.  Pt was reported to have 2-minute grand mal seizure while eating at a restaurant.  Pt reported that this was the "2nd" episode today--- the 1st one was at around 1600.  Pt was already awake and A/Ox4 on EMS' arrival at the scene.  No s/s oral injury or incontinence observe with his seizure.

## 2014-09-12 NOTE — ED Notes (Signed)
Bed: WA07 Expected date:  Expected time:  Means of arrival:  Comments: 67M Seizure/witnessed ? aspiration

## 2014-09-12 NOTE — ED Notes (Signed)
Pt was given 23ml of water.  Denies any nausea, vomiting, or difficulty swallowing.

## 2014-09-17 ENCOUNTER — Other Ambulatory Visit: Payer: Self-pay | Admitting: *Deleted

## 2014-09-17 ENCOUNTER — Encounter: Payer: Self-pay | Admitting: Hematology & Oncology

## 2014-09-17 ENCOUNTER — Other Ambulatory Visit (HOSPITAL_BASED_OUTPATIENT_CLINIC_OR_DEPARTMENT_OTHER): Payer: BLUE CROSS/BLUE SHIELD

## 2014-09-17 ENCOUNTER — Ambulatory Visit (HOSPITAL_BASED_OUTPATIENT_CLINIC_OR_DEPARTMENT_OTHER): Payer: BLUE CROSS/BLUE SHIELD | Admitting: Hematology & Oncology

## 2014-09-17 VITALS — BP 142/77 | HR 62 | Temp 98.8°F | Resp 20 | Ht 69.0 in | Wt 239.0 lb

## 2014-09-17 DIAGNOSIS — C711 Malignant neoplasm of frontal lobe: Secondary | ICD-10-CM

## 2014-09-17 DIAGNOSIS — M7989 Other specified soft tissue disorders: Secondary | ICD-10-CM

## 2014-09-17 DIAGNOSIS — I10 Essential (primary) hypertension: Secondary | ICD-10-CM

## 2014-09-17 DIAGNOSIS — B370 Candidal stomatitis: Secondary | ICD-10-CM

## 2014-09-17 DIAGNOSIS — I82401 Acute embolism and thrombosis of unspecified deep veins of right lower extremity: Secondary | ICD-10-CM | POA: Diagnosis not present

## 2014-09-17 LAB — CBC WITH DIFFERENTIAL (CANCER CENTER ONLY)
BASO#: 0 10*3/uL (ref 0.0–0.2)
BASO%: 0.2 % (ref 0.0–2.0)
EOS%: 0.2 % (ref 0.0–7.0)
Eosinophils Absolute: 0 10*3/uL (ref 0.0–0.5)
HCT: 36.9 % — ABNORMAL LOW (ref 38.7–49.9)
HGB: 12.1 g/dL — ABNORMAL LOW (ref 13.0–17.1)
LYMPH#: 0.9 10*3/uL (ref 0.9–3.3)
LYMPH%: 6.8 % — ABNORMAL LOW (ref 14.0–48.0)
MCH: 31.1 pg (ref 28.0–33.4)
MCHC: 32.8 g/dL (ref 32.0–35.9)
MCV: 95 fL (ref 82–98)
MONO#: 0.8 10*3/uL (ref 0.1–0.9)
MONO%: 6.1 % (ref 0.0–13.0)
NEUT#: 10.9 10*3/uL — ABNORMAL HIGH (ref 1.5–6.5)
NEUT%: 86.7 % — ABNORMAL HIGH (ref 40.0–80.0)
PLATELETS: 287 10*3/uL (ref 145–400)
RBC: 3.89 10*6/uL — ABNORMAL LOW (ref 4.20–5.70)
RDW: 15.4 % (ref 11.1–15.7)
WBC: 12.6 10*3/uL — ABNORMAL HIGH (ref 4.0–10.0)

## 2014-09-17 LAB — CMP (CANCER CENTER ONLY)
ALK PHOS: 63 U/L (ref 26–84)
ALT: 31 U/L (ref 10–47)
AST: 24 U/L (ref 11–38)
Albumin: 3.1 g/dL — ABNORMAL LOW (ref 3.3–5.5)
BUN, Bld: 29 mg/dL — ABNORMAL HIGH (ref 7–22)
CO2: 30 meq/L (ref 18–33)
Calcium: 8.9 mg/dL (ref 8.0–10.3)
Chloride: 104 mEq/L (ref 98–108)
Creat: 2.3 mg/dl — ABNORMAL HIGH (ref 0.6–1.2)
Glucose, Bld: 112 mg/dL (ref 73–118)
Potassium: 4.2 mEq/L (ref 3.3–4.7)
Sodium: 143 mEq/L (ref 128–145)
Total Bilirubin: 0.6 mg/dl (ref 0.20–1.60)
Total Protein: 6.3 g/dL — ABNORMAL LOW (ref 6.4–8.1)

## 2014-09-17 LAB — URINALYSIS, MICROSCOPIC (CHCC SATELLITE)
BLOOD: NEGATIVE
Bacteria, UA: NEGATIVE
Bilirubin (Urine): NEGATIVE
Glucose: NEGATIVE mg/dL
KETONES: NEGATIVE mg/dL
Leukocyte Esterase: NEGATIVE
Nitrite: NEGATIVE
PH: 7.5 (ref 4.60–8.00)
PROTEIN: NEGATIVE mg/dL
SPECIFIC GRAVITY, URINE: 1.005 (ref 1.003–1.035)
Urobilinogen, UR: 0.2 mg/dL (ref 0.2–1)

## 2014-09-17 MED ORDER — POTASSIUM CHLORIDE CRYS ER 20 MEQ PO TBCR: 30.0000 meq | EXTENDED_RELEASE_TABLET | Freq: Two times a day (BID) | ORAL | Status: AC

## 2014-09-17 MED ORDER — POTASSIUM CHLORIDE CRYS ER 20 MEQ PO TBCR: 30.0000 meq | EXTENDED_RELEASE_TABLET | Freq: Two times a day (BID) | ORAL | Status: DC

## 2014-09-18 ENCOUNTER — Telehealth: Payer: Self-pay | Admitting: Hematology & Oncology

## 2014-09-18 LAB — LACTATE DEHYDROGENASE: LDH: 282 U/L — AB (ref 94–250)

## 2014-09-18 LAB — PREALBUMIN: PREALBUMIN: 45 mg/dL — AB (ref 21–43)

## 2014-09-18 NOTE — Progress Notes (Signed)
Hematology and Oncology Follow Up Visit  Edward [name] [id number] 07/14/1945 69 y.o. 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  Temodar 100 mg by mouth daily   Xarelto 20mg  po qday     Interim History:  [name] is back for follow-up. Unfortunately, he seems to be declining. He's had more seizures. He recently had a couple seizures. He went to the emergency room. Of course, they don't bother doing any scans. They just told him to increase his anti-seizure medication.  His wife is incredibly frustrated with the fact that he does not seem to be getting better. She called her husband's oncologist at [facility]. I will have to speak to his oncologist myself.  He still has a lot of swelling in his legs. He did have the thrombus in his right leg. His last Doppler showed that this was resolved. He is on Xarelto. He's has some bruising.  To me, he is clinically progressing. I know that he's had his MRIs at [facility]. However, he has not been able to have any contrast. This limits the ability to determine whether or not he does have progression or if this is "pseudo-progression" he is on steroids. I don't think we can decrease the steroid dose.  I think may be a dedicated brain PET scan might help [place] out. I would think that this might be overdue show [place] if he has progressive disease.  He is on Temodar. I think that if he has evidence of progressive disease, we can switch him to Avastin with or without irinotecan. We also could consider Nivolumab although this is off label use.  His wife and the patient himself would be amenable to a change in therapy to try to help his overall quality of life.  He is still eating okay. He's had no issues with incontinence. He's been trying to do some  physical therapy but this is very difficult.  I would have to say that his overall performance status is ECOG 2.  Medications:  Current outpatient prescriptions:  .  allopurinol (ZYLOPRIM) 100 MG tablet, Take 100 mg by mouth daily. , Disp: , Rfl:  .  buPROPion (WELLBUTRIN XL) 300 MG 24 hr tablet, Take 300 mg by mouth daily., Disp: , Rfl:  .  citalopram (CELEXA) 40 MG tablet, Take 1 tablet (40 mg total) by mouth daily., Disp: 30 tablet, Rfl: 2 .  dexamethasone (DECADRON) 2 MG tablet, Take 2 tablets three times a day on day 1, then 1 tab twice a day thereafter. Call Dr. Isidore Moos on 2/24 to update. (Patient taking differently: 2 mg. ), Disp: 60 tablet, Rfl: 2 .  docusate sodium (COLACE) 100 MG capsule, Take 100 mg by mouth daily as needed for mild constipation., Disp: , Rfl:  .  fluconazole (DIFLUCAN) 100 MG tablet, Take 1 tablet (100 mg total) by mouth daily., Disp: 30 tablet, Rfl: 4 .  furosemide (LASIX) 40 MG tablet, Take 60 mg by mouth daily. , Disp: , Rfl:  .  HYDROcodone-acetaminophen (NORCO/VICODIN) 5-325 MG per tablet, Take 1 tablet by mouth every 6 (six) hours as needed. , Disp: , Rfl:  .  lacosamide (VIMPAT) 50 MG TABS tablet, Take 3 tablets (150 mg total) by mouth 2 (two) times daily. (Patient taking differently: Take  300 mg by mouth 2 (two) times daily. ), Disp: 60 tablet, Rfl: 1 .  LORazepam (ATIVAN) 0.5 MG tablet, Place one tablet under tongue at onset of seizure. May repeat x 1 in 10 minutes., Disp: , Rfl:  .  metoprolol succinate (TOPROL-XL) 100 MG 24 hr tablet, Take 100 mg by mouth daily. , Disp: , Rfl:  .  Multiple Vitamins-Minerals (MULTIVITAMIN PO), Take 1 tablet by mouth daily., Disp: , Rfl:  .  omeprazole (PRILOSEC) 40 MG capsule, Take 40 mg by mouth daily. , Disp: , Rfl:  .  ondansetron (ZOFRAN) 8 MG tablet, Take 1 pill 40min prior to Temodar, Disp: 30 tablet, Rfl: 3 .  potassium chloride SA (KLOR-CON M20) 20 MEQ tablet, Take 1.5 tablets (30 mEq total) by mouth 2 (two) times  daily. Patient is currently taking 1.5 tablets in the morning and 1.5 tablets at night (20MEQ tablets), Disp: 270 tablet, Rfl: 3 .  potassium chloride SA (KLOR-CON M20) 20 MEQ tablet, Take 1.5 tablets (30 mEq total) by mouth 2 (two) times daily., Disp: 270 tablet, Rfl: 3 .  rivaroxaban (XARELTO) 20 MG TABS tablet, Take 1 tablet (20 mg total) by mouth daily with supper. (Patient taking differently: Take 20 mg by mouth every morning. ), Disp: 30 tablet, Rfl: 12 .  sulfamethoxazole-trimethoprim (BACTRIM DS,SEPTRA DS) 800-160 MG per tablet, TAKE ONE TABLET BY MOUTH ONCE DAILY (Patient taking differently: TAKES 1/2 TAB), Disp: 30 tablet, Rfl: 0 .  temozolomide (TEMODAR) 100 MG capsule, Please take 1 pill a day at bedtime., Disp: 30 capsule, Rfl: 3 .  zolpidem (AMBIEN) 10 MG tablet, Take 10 mg by mouth at bedtime as needed for sleep. , Disp: , Rfl:   Allergies:  Allergies  Allergen Reactions  . Nsaids Other (See Comments)    Not good for kidney    Past Medical History, Surgical history, Social history, and Family History were reviewed and updated.  Review of Systems: As above  Physical Exam:  height is 5\' 9"  (1.753 m) and weight is 239 lb (108.41 kg). His oral temperature is 98.8 F (37.1 C). His blood pressure is 142/77 and his pulse is 62. His respiration is 20.   Cushingoid appearing white male. He is in no distress. He is alert and oriented. Head and neck exam shows no adenopathy. He has no ocular or oral lesions. Pupils react appropriately. Lungs are clear. Cardiac exam regular rate and rhythm with no murmurs, rubs or bruits. Abdomen is soft, he is moderately obese. There is no fluid wave. There is no palpable liver or spleen tip. Extremities shows 2 + edema in his lower legs. There may be some slight increase in edema in the right leg. No palpable venous cord is noted. He has good range of motion of his joints. He has 4/5 strength in his lower legs. He has 2+ strength in his thighs.  Neurological exam shows no focal neurological deficits. Back exam shows no tenderness over the spine, ribs or hips. Skin exam shows no rashes, or ecchymosis or petechia.  Lab Results  Component Value Date   WBC 12.6* 09/17/2014   HGB 12.1* 09/17/2014   HCT 36.9* 09/17/2014   MCV 95 09/17/2014   PLT 287 09/17/2014     Chemistry      Component Value Date/Time   NA 143 09/17/2014 1438   NA 137 09/12/2014 2057   NA 138 02/28/2014 1048   K 4.2 09/17/2014 1438   K 4.4 09/12/2014 2057   K 4.6  02/28/2014 1048   CL 104 09/17/2014 1438   CL 105 09/12/2014 2057   CO2 30 09/17/2014 1438   CO2 26 09/12/2014 2057   CO2 22 02/28/2014 1048   BUN 29* 09/17/2014 1438   BUN 38* 09/12/2014 2057   BUN 53.2* 02/28/2014 1048   CREATININE 2.3* 09/17/2014 1438   CREATININE 2.48* 09/12/2014 2057   CREATININE 2.5* 02/28/2014 1048      Component Value Date/Time   CALCIUM 8.9 09/17/2014 1438   CALCIUM 8.6 09/12/2014 2057   CALCIUM 9.2 02/28/2014 1048   ALKPHOS 63 09/17/2014 1438   ALKPHOS 76 09/04/2014 1200   ALKPHOS 106 02/28/2014 1048   AST 24 09/17/2014 1438   AST 21 09/04/2014 1200   AST 45* 02/28/2014 1048   ALT 31 09/17/2014 1438   ALT 25 09/04/2014 1200   ALT 118* 02/28/2014 1048   BILITOT 0.60 09/17/2014 1438   BILITOT 0.5 09/04/2014 1200   BILITOT 0.35 02/28/2014 1048         Impression and Plan: Mr. Hammen is 69 year old gentleman with a partially resected glioblastoma of the right frontal lobe.  Of note, his tumor is not MGMT methylated. This typically indicates a more aggressive type tumor.  From my point of view, it certainly appears that he is progressing. He just has more neurological issues. He's got more weakness on his left side. I can understand the dilemma that the physicians at [facility] have.  Again, I think that a dedicated PET scan of the brain may not be about idea. I will try to get this setup for next week.   I spent about 45 minutes with he and his wife. It  is always c complicated when we see him because all the issues that he is dealing with.  I want to see him back in about 2 weeks or so.Marland Kitchen    Volanda Napoleon, MD 5/5/20167:30 AM

## 2014-09-18 NOTE — Telephone Encounter (Signed)
Faxed medical records to:  [facility] P: [phone] F: [phone]  Clinton Gallant, DPT  Last ovn     COPY SCANNED

## 2014-09-19 ENCOUNTER — Ambulatory Visit
Admission: RE | Admit: 2014-09-19 | Discharge: 2014-09-19 | Disposition: A | Discharge status: Home / Self Care | Payer: BLUE CROSS/BLUE SHIELD | Source: Ambulatory Visit | Attending: Radiation Oncology | Admitting: Radiation Oncology

## 2014-09-19 DIAGNOSIS — C711 Malignant neoplasm of frontal lobe: Secondary | ICD-10-CM

## 2014-09-19 NOTE — Progress Notes (Signed)
Patient for routine follow up radiation glioblastoma.Patient symptoms of weakness or lower extremities are worsening.He had 2 seizures on last Friday.Resumed dexamethasone 3 mg daily.Scheduled for PET 09/22/14 to further assess status.Greatest concern is wife questions why patient seems worse when mri states status unchanged.

## 2014-09-19 NOTE — Progress Notes (Signed)
.  Radiation Oncology         [phone]) [phone] ________________________________  Name: Edward [name] MRN: [id number]  Date: 09/19/2014  DOB: 26-Aug-1945  Follow-Up Visit Note  Outpatient  CC: MCNEILL,WENDY, MD  Kristeen Miss, MD  Diagnosis and Prior Radiotherapy:    ICD-9-CM ICD-10-CM   1. 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 Patient describes symptoms of weakness of lower extremities are worsening for the last couple of weeks. Pt's wife states his memory and vision is worsening. He had 2 seizures last Friday. The pt fell on his left leg last Friday. Resumed dexamethasone 3 mg daily. Scheduled for PET 09/22/14 to further assess status. The pt's wife questions why the patient seems worse when MRI states status unchanged. Pt denies numbness in the arms and legs.  Dr. Marin Olp saw him on 09/18/14 and was concerned about tumor progression due to neurologic issues. He is on temozolomide . MRI of brain without contrast on 09/03/14 at [facility] showed slight worsening of white matter changes and increased signal abnormalities. He cannot receive contrast due to kidneys.   ALLERGIES:  is allergic to nsaids.  Meds: Current Outpatient Prescriptions  Medication Sig Dispense Refill  . allopurinol (ZYLOPRIM) 100 MG tablet Take 100 mg by mouth daily.     Marland Kitchen buPROPion (WELLBUTRIN XL) 300 MG 24 hr tablet Take 300 mg by mouth daily.    . citalopram (CELEXA) 40 MG tablet Take 1 tablet (40 mg total) by mouth daily. 30 tablet 2  . dexamethasone (DECADRON) 2 MG tablet Take 2 tablets three times a day on day 1, then 1 tab twice a day thereafter. Call Dr. Isidore Moos on  2/24 to update. (Patient taking differently: 2 mg. Current dose 3 mg daily) 60 tablet 2  . docusate sodium (COLACE) 100 MG capsule Take 100 mg by mouth daily as needed for mild constipation.    . furosemide (LASIX) 40 MG tablet Take 60 mg by mouth daily.     Marland Kitchen HYDROcodone-acetaminophen (NORCO/VICODIN) 5-325 MG per tablet Take 1 tablet by mouth every 6 (six) hours as needed.     . lacosamide (VIMPAT) 50 MG TABS tablet Take 3 tablets (150 mg total) by mouth 2 (two) times daily. (Patient taking differently: Take 300 mg by mouth 2 (two) times daily. ) 60 tablet 1  . LORazepam (ATIVAN) 0.5 MG tablet Place one tablet under tongue at onset of seizure. May repeat x 1 in 10 minutes.    . metoprolol succinate (TOPROL-XL) 100 MG 24 hr tablet Take 100 mg by mouth daily.     . Multiple Vitamins-Minerals (MULTIVITAMIN PO) Take 1 tablet by mouth daily.    Marland Kitchen omeprazole (PRILOSEC) 40 MG capsule Take 40 mg by mouth daily.     . ondansetron (ZOFRAN) 8 MG tablet Take 1 pill 54min prior to Temodar 30 tablet 3  . potassium chloride SA (KLOR-CON M20) 20 MEQ tablet Take 1.5 tablets (30 mEq total) by mouth 2 (two) times daily. Patient is currently taking 1.5 tablets in the morning and 1.5 tablets at night (20MEQ tablets) 270 tablet 3  . potassium chloride SA (KLOR-CON M20) 20 MEQ tablet Take 1.5  tablets (30 mEq total) by mouth 2 (two) times daily. 270 tablet 3  . rivaroxaban (XARELTO) 20 MG TABS tablet Take 1 tablet (20 mg total) by mouth daily with supper. (Patient taking differently: Take 20 mg by mouth every morning. ) 30 tablet 12  . sulfamethoxazole-trimethoprim (BACTRIM DS,SEPTRA DS) 800-160 MG per tablet TAKE ONE TABLET BY MOUTH ONCE DAILY (Patient taking differently: TAKES 1/2 TAB) 30 tablet 0  . temozolomide (TEMODAR) 100 MG capsule Please take 1 pill a day at bedtime. 30 capsule 3  . zolpidem (AMBIEN) 10 MG tablet Take 10 mg by mouth at bedtime as needed for sleep.     . fluconazole (DIFLUCAN) 100 MG tablet Take 1  tablet (100 mg total) by mouth daily. (Patient not taking: Reported on 09/19/2014) 30 tablet 4   No current facility-administered medications for this encounter.    Physical Findings: The patient is in no acute distress..   98.4, 142/82, pulse 57, 97% on RA General: Alert and oriented, in no acute distress, in wheelchair HEENT: Head is normocephalic. Extraocular movements are intact. Oropharynx is clear. Skin:   The pt has a large band-aid on his left hand. Multiple ecchymoses. Musculoskeletal: Subtle weakness in the left arm. Subtle weakness in the left leg; could be a result of recent trauma (fell on his left leg). Finger to nose testing is wobbly; more so with the left hand. Psychiatric: Judgment and insight are intact. Affect is appropriate.  KPS 40   100 - Normal; no complaints; no evidence of disease. 90   - Able to carry on normal activity; minor signs or symptoms of disease. 80   - Normal activity with effort; some signs or symptoms of disease. 22   - Cares for self; unable to carry on normal activity or to do active work. 60   - Requires occasional assistance, but is able to care for most of his personal needs. 50   - Requires considerable assistance and frequent medical care. 36   - Disabled; requires special care and assistance. 24   - Severely disabled; hospital admission is indicated although death not imminent. 47   - Very sick; hospital admission necessary; active supportive treatment necessary. 10   - Moribund; fatal processes progressing rapidly. 0     - Dead  Karnofsky DA, Abelmann [place], Craver LS and Burchenal [facility] [phone]) The use of the nitrogen mustards in the palliative treatment of carcinoma: with particular reference to bronchogenic carcinoma Cancer 1 [phone]  Lab Findings: Lab Results  Component Value Date   WBC 12.6* 09/17/2014   HGB 12.1* 09/17/2014   HCT 36.9* 09/17/2014   MCV 95 09/17/2014   PLT 287 09/17/2014    Radiographic Findings:  (MRI from 4/20) at  [facility] of Brain wo contrast: I was able to look at this scan but did not have the CD of the prior to compare.  Diffuse white matter changes which by report were increased from January's MRI; etiology unclear but could be due to tumor progression    Impression/Plan:  We had a lengthy discussion.  Agree with referral to neurologist for seizure management (per Dr Simmie Davies).  While my ability to discern tumor status is limited without all of his images and without any IV contrast, his declining clinical status, and high risk disease, make tumor progression rather high on the differential. They will pursue PET imaging of brain and re-group with medical oncology Simmie Davies, [place]) for possible salvage therapy.  Marcine Matar will call the pt about  scheduling consult w/ palliative care. We discussed rationale of this; they are enthusiastic to meet Wadie Lessen.  They understand his prognosis is guarded at best and that he will likely die, eventually, of this disease.  Regardless of prognosis, they are interested in quality of life discussions.   I gave the pt a card to schedule a follow-up in roughly 3 months. The patient was encouraged to call with any issues or questions before then.  Over 25 minutes spent on visit, over 50% on counseling and care coordination.   This document serves as a record of services personally performed by Eppie Gibson, MD. It was created on her behalf by Darcus Austin, a trained medical scribe. The creation of this record is based on the scribe's personal observations and the provider's statements to them. This document has been checked and approved by the attending provider.    _____________________________________   Eppie Gibson, MD

## 2014-09-22 ENCOUNTER — Ambulatory Visit (HOSPITAL_COMMUNITY)
Admission: RE | Admit: 2014-09-22 | Discharge: 2014-09-22 | Disposition: A | Discharge status: Home / Self Care | Payer: BLUE CROSS/BLUE SHIELD | Source: Ambulatory Visit | Attending: Hematology & Oncology | Admitting: Hematology & Oncology

## 2014-09-22 DIAGNOSIS — C711 Malignant neoplasm of frontal lobe: Secondary | ICD-10-CM | POA: Diagnosis present

## 2014-09-22 DIAGNOSIS — Z923 Personal history of irradiation: Secondary | ICD-10-CM | POA: Insufficient documentation

## 2014-09-22 MED ORDER — FLUDEOXYGLUCOSE F - 18 (FDG) INJECTION
9.9000 | Freq: Once | INTRAVENOUS | Status: AC | PRN
  Administered 2014-09-22: 9.9 via INTRAVENOUS

## 2014-09-24 ENCOUNTER — Encounter: Payer: Self-pay | Admitting: Hematology & Oncology

## 2014-09-25 ENCOUNTER — Encounter: Payer: Self-pay | Admitting: Hematology & Oncology

## 2014-09-30 ENCOUNTER — Telehealth: Payer: Self-pay | Admitting: Hematology & Oncology

## 2014-09-30 NOTE — Telephone Encounter (Signed)
Patient called back and 10/09/14 and resch for 10/23/14

## 2014-09-30 NOTE — Telephone Encounter (Signed)
Patient called and cx 10/01/14 and resch for 10/09/14

## 2014-10-01 ENCOUNTER — Ambulatory Visit (HOSPITAL_BASED_OUTPATIENT_CLINIC_OR_DEPARTMENT_OTHER): Payer: BLUE CROSS/BLUE SHIELD

## 2014-10-01 ENCOUNTER — Other Ambulatory Visit: Payer: BC Managed Care – PPO

## 2014-10-01 ENCOUNTER — Ambulatory Visit: Payer: BC Managed Care – PPO | Admitting: Hematology & Oncology

## 2014-10-06 ENCOUNTER — Ambulatory Visit (HOSPITAL_BASED_OUTPATIENT_CLINIC_OR_DEPARTMENT_OTHER)
Admission: RE | Admit: 2014-10-06 | Discharge: 2014-10-06 | Disposition: A | Discharge status: Home / Self Care | Payer: BLUE CROSS/BLUE SHIELD | Source: Ambulatory Visit | Attending: Hematology & Oncology | Admitting: Hematology & Oncology

## 2014-10-06 DIAGNOSIS — Z85841 Personal history of malignant neoplasm of brain: Secondary | ICD-10-CM | POA: Insufficient documentation

## 2014-10-06 DIAGNOSIS — M7989 Other specified soft tissue disorders: Secondary | ICD-10-CM | POA: Insufficient documentation

## 2014-10-06 DIAGNOSIS — I82401 Acute embolism and thrombosis of unspecified deep veins of right lower extremity: Secondary | ICD-10-CM

## 2014-10-06 DIAGNOSIS — Z86718 Personal history of other venous thrombosis and embolism: Secondary | ICD-10-CM | POA: Diagnosis not present

## 2014-10-08 ENCOUNTER — Other Ambulatory Visit: Payer: Self-pay | Admitting: Radiation Oncology

## 2014-10-08 ENCOUNTER — Inpatient Hospital Stay
Admission: RE | Admit: 2014-10-08 | Discharge: 2014-10-08 | Disposition: A | Discharge status: Home / Self Care | Payer: Self-pay | Source: Ambulatory Visit | Attending: Radiation Oncology | Admitting: Radiation Oncology

## 2014-10-08 DIAGNOSIS — C719 Malignant neoplasm of brain, unspecified: Secondary | ICD-10-CM

## 2014-10-09 ENCOUNTER — Ambulatory Visit: Payer: Self-pay | Admitting: Hematology & Oncology

## 2014-10-09 ENCOUNTER — Other Ambulatory Visit: Payer: Medicare Other

## 2014-10-09 ENCOUNTER — Ambulatory Visit: Payer: Self-pay | Admitting: Neurology

## 2014-10-09 ENCOUNTER — Ambulatory Visit: Payer: BC Managed Care – PPO | Admitting: Neurology

## 2014-10-13 ENCOUNTER — Other Ambulatory Visit: Payer: Self-pay | Admitting: Hematology & Oncology

## 2014-10-16 ENCOUNTER — Encounter: Payer: Self-pay | Admitting: General Practice

## 2014-10-16 NOTE — Progress Notes (Signed)
Spiritual Care Note  Pt self-referred via suggestion from Edward [name], [place] at [facility], because he is a NICU parent and dedicated volunteer; per pt, his daughter, now 22, is an ex-29-weeker.  Edward [name] and I are former Medical laboratory scientific officer, so she recommended me to pt as a source of support.)  Edward Edward [name] came to his appointment with Edward [name], his aide, whom he invited to stay for the conversation.  He came without a specific agenda and could not identify one during our conversation; this meeting appeared to be an opportunity to feel out what Spiritual Care may have to offer to him as he processes his decline, challenges, stories, and goals.  He performed some life review, highlighting significant themes and challenges.  I provided empathic listening, reflecting back what I was hearing both to confirm and to affirm.  Offered additional language to help him interpret the significance of what he was sharing; for example, helped him name that the pride he feels in his daughter's professional work and successes (in a field that he himself is passionate about) may be part of his legacy. Held the space for him to share and reflect at his own pace; Edward [name] indicated appreciatively that this is a skill he used often in professional negotiations, lamenting that few people hold silence/quiet anymore but instead "fill all the space in a conversation."  Also asked questions to invite more depth or vulnerability, if he chose to share.  Invited him to brain cancer support group.  Today was not the right fit for him, but he is aware of future opportunities.  He plans to follow up with me when he discerns whether he would like to meet again.  Please also page for support on campus or call/inbox if his situation/needs change(s) dramatically.  Thank you.  Edward [name], [place] [phone])

## 2014-10-20 ENCOUNTER — Ambulatory Visit
Admission: RE | Admit: 2014-10-20 | Discharge: 2014-10-20 | Disposition: A | Discharge status: Home / Self Care | Payer: BLUE CROSS/BLUE SHIELD | Source: Ambulatory Visit | Attending: Radiation Oncology | Admitting: Radiation Oncology

## 2014-10-20 DIAGNOSIS — Z7189 Other specified counseling: Secondary | ICD-10-CM

## 2014-10-20 DIAGNOSIS — C719 Malignant neoplasm of brain, unspecified: Secondary | ICD-10-CM | POA: Diagnosis not present

## 2014-10-20 DIAGNOSIS — Z515 Encounter for palliative care: Secondary | ICD-10-CM | POA: Diagnosis not present

## 2014-10-20 NOTE — Consult Note (Signed)
Consultation Note Date: 10/20/2014   Patient Name: Edward [name]  DOB: [birth year]  MRN: [id number]  Age / Sex: 69 y.o., male   PCP: Cari Caraway, MD Referring Physician: No att. providers found  Reason for Consultation: Non pain symptom management and Psychosocial/spiritual support  Palliative Care Assessment and Plan Summary of Established Goals of Care and Medical Treatment Preferences    Palliative Care Discussion Held Today:    This NP Wadie Lessen reviewed medical records, received report from team, assessed the patient and then meet at the patient and his wife/Edward [name] and caregiver Edward [name] in OP radiation-oncology  to discuss diagnosis, prognosis, GOC and treatment options, and anticipatory care needs.  A discussion was had today regarding advanced directives.   Values and goals of care important to patient and family were attempted to be elicited.    The difference between a aggressive medical intervention path  and a palliative comfort care path for this patient at this time was had.  Concept of Palliative Care was discussed  Questions and concerns addressed.   Family encouraged to call with questions or concerns.  PMT will continue to support holistically.   Goals of Care: 1.  Code Status:  DNR/DNI   2. Scope of Treatment:  At this time patient is open to all available and offered medical interventions to prolong quality life.     They struggle with "knowing what to do, we don't just want to take something for the sake of it, we want to know what the expectations are", the hope is for extended quality of life.  They are conflicted in to "who to listen to and what to do".  They have been seen at [facility], and they plan to see Dr Harland German next week.  Unsure of their commitment to starting Avastin  He sees Dr Simonne Martinet for his renal disease (one kidney).   "Healthcare  is a complex system to navigate"   4. Symptom Management:   Fatigue/Weakness:-Pace yourself -Plan your  day -Include naps and breaks -schedule a relaxing day -get a little exercise -fuel the body -consider complementary therapies -deep breathing -prayer/medication  -guided meditation  5. Psychosocial:  Emotional support offered to patient and his wife Edward [name].  Both struggle with the knowing of the poor prognosis, and figuring out to to continue to live.  Wife and 5 year old daughter are currently in counseling and patient is hopeful to continue with counseling/emotional support with this NP with the PMT. Both patient and his wife were able to verbalize thoughts and fears regarding the current medical situation and their hope to  "at least enjoy today"   BMET    Component Value Date/Time   NA 143 09/17/2014 1438   NA 137 09/12/2014 2057   NA 138 02/28/2014 1048   K 4.2 09/17/2014 1438   K 4.4 09/12/2014 2057   K 4.6 02/28/2014 1048   CL 104 09/17/2014 1438   CL 105 09/12/2014 2057   CO2 30 09/17/2014 1438   CO2 26 09/12/2014 2057   CO2 22 02/28/2014 1048   GLUCOSE 112 09/17/2014 1438   GLUCOSE 102* 09/12/2014 2057   GLUCOSE 103 02/28/2014 1048   BUN 29* 09/17/2014 1438   BUN 38* 09/12/2014 2057   BUN 53.2* 02/28/2014 1048   CREATININE 2.3* 09/17/2014 1438   CREATININE 2.48* 09/12/2014 2057   CREATININE 2.5* 02/28/2014 1048   CALCIUM 8.9 09/17/2014 1438   CALCIUM 8.6 09/12/2014 2057   CALCIUM 9.2 02/28/2014 1048   GFRNONAA  25* 09/12/2014 2057   GFRAA 30* 09/12/2014 2057    CMP     Component Value Date/Time   NA 143 09/17/2014 1438   NA 137 09/12/2014 2057   NA 138 02/28/2014 1048   K 4.2 09/17/2014 1438   K 4.4 09/12/2014 2057   K 4.6 02/28/2014 1048   CL 104 09/17/2014 1438   CL 105 09/12/2014 2057   CO2 30 09/17/2014 1438   CO2 26 09/12/2014 2057   CO2 22 02/28/2014 1048   GLUCOSE 112 09/17/2014 1438   GLUCOSE 102* 09/12/2014 2057   GLUCOSE 103 02/28/2014 1048   BUN 29* 09/17/2014 1438   BUN 38* 09/12/2014 2057   BUN 53.2* 02/28/2014 1048   CREATININE  2.3* 09/17/2014 1438   CREATININE 2.48* 09/12/2014 2057   CREATININE 2.5* 02/28/2014 1048   CALCIUM 8.9 09/17/2014 1438   CALCIUM 8.6 09/12/2014 2057   CALCIUM 9.2 02/28/2014 1048   PROT 6.3* 09/17/2014 1438   PROT 6.1 09/04/2014 1200   PROT 6.6 02/28/2014 1048   ALBUMIN 3.5 09/04/2014 1200   ALBUMIN 2.9* 02/28/2014 1048   AST 24 09/17/2014 1438   AST 21 09/04/2014 1200   AST 45* 02/28/2014 1048   ALT 31 09/17/2014 1438   ALT 25 09/04/2014 1200   ALT 118* 02/28/2014 1048   ALKPHOS 63 09/17/2014 1438   ALKPHOS 76 09/04/2014 1200   ALKPHOS 106 02/28/2014 1048   BILITOT 0.60 09/17/2014 1438   BILITOT 0.5 09/04/2014 1200   BILITOT 0.35 02/28/2014 1048   GFRNONAA 25* 09/12/2014 2057   GFRAA 30* 09/12/2014 2057   ECOG PERFORMANCE STATUS* (Eastern Cooperative Oncology Group)  0 Fully active, able to continue with all pre-disease activities without restriction. Pt score  1 Restricted in physically strenuous activity but ambulatory and able to carry out work of a light or sedentary nature, e.g., light house work, office work.   2 Ambulatory and capable of all self-care but unable to carry out any work activities. Up and about more than 50% of waking hours.    3 Capable of only limited self-care. Confined to bed or chair more than 50% of waking hours. 3  4 Completely disabled. Cannot carry on any self-care. Totally confined to bed or chair.   5 Dead.    As published in Am. J. Clin. Oncol.: Eustace Pen, M.M., Colon Flattery., [place], D.C., Horton, Sharen Hint., Drexel Iha, P.P.: Toxicity And Response Criteria Of The [facility]. [place] 4:656-812, 1982.  The ECOG Performance Status is in the public domain therefore available for public use. To duplicate the scale, please cite the reference above and credit the [facility] Group, Tyler Pita M.D., Group Chair   Physical Exam:  Vital Signs: There were no vitals taken for this  visit. SpO2:   O2 Device:   O2 Flow Rate:   Intake/output summary: No intake or output data in the 24 hours ending 10/20/14 1340 LBM:   Baseline Weight:   Most recent weight:    Exam Findings:   General: chronically ill appearing, NAD, in wheelchair HEENT: puffy face 2/2 steroids Extrem: +2 BLE ankle edema          Palliative Performance Scale: 30 %                Additional Data Reviewed: No results for input(s): WBC, HGB, PLT, NA, BUN, CREATININE, ALB in the last 72 hours.   Time In: 1100 Time Out: 1230 Time Total:  90 min  Greater than 50%  of this time was spent counseling and coordinating care related to the above assessment and plan.   Discussed with Dr Marin Olp    Signed by: Wadie Lessen, NP  Knox Royalty, NP  10/20/2014, 1:40 PM  Please contact Palliative Medicine Team phone at [phone] for questions and concerns.   See AMION for contact information

## 2014-10-23 ENCOUNTER — Other Ambulatory Visit (HOSPITAL_BASED_OUTPATIENT_CLINIC_OR_DEPARTMENT_OTHER): Payer: BLUE CROSS/BLUE SHIELD

## 2014-10-23 ENCOUNTER — Ambulatory Visit (HOSPITAL_BASED_OUTPATIENT_CLINIC_OR_DEPARTMENT_OTHER): Payer: BLUE CROSS/BLUE SHIELD | Admitting: Hematology & Oncology

## 2014-10-23 VITALS — BP 135/71 | HR 57 | Temp 97.6°F | Wt 241.0 lb

## 2014-10-23 DIAGNOSIS — Z85528 Personal history of other malignant neoplasm of kidney: Secondary | ICD-10-CM | POA: Diagnosis not present

## 2014-10-23 DIAGNOSIS — C711 Malignant neoplasm of frontal lobe: Secondary | ICD-10-CM

## 2014-10-23 DIAGNOSIS — I82401 Acute embolism and thrombosis of unspecified deep veins of right lower extremity: Secondary | ICD-10-CM

## 2014-10-23 DIAGNOSIS — Z86718 Personal history of other venous thrombosis and embolism: Secondary | ICD-10-CM | POA: Diagnosis not present

## 2014-10-23 DIAGNOSIS — Z905 Acquired absence of kidney: Secondary | ICD-10-CM

## 2014-10-23 LAB — CBC WITH DIFFERENTIAL (CANCER CENTER ONLY)
BASO#: 0.1 10*3/uL (ref 0.0–0.2)
BASO%: 0.6 % (ref 0.0–2.0)
EOS ABS: 0.2 10*3/uL (ref 0.0–0.5)
EOS%: 1.4 % (ref 0.0–7.0)
HCT: 37.3 % — ABNORMAL LOW (ref 38.7–49.9)
HEMOGLOBIN: 11.8 g/dL — AB (ref 13.0–17.1)
LYMPH#: 1 10*3/uL (ref 0.9–3.3)
LYMPH%: 8.9 % — AB (ref 14.0–48.0)
MCH: 30.6 pg (ref 28.0–33.4)
MCHC: 31.6 g/dL — AB (ref 32.0–35.9)
MCV: 97 fL (ref 82–98)
MONO#: 0.6 10*3/uL (ref 0.1–0.9)
MONO%: 5.9 % (ref 0.0–13.0)
NEUT%: 83.2 % — AB (ref 40.0–80.0)
NEUTROS ABS: 9.1 10*3/uL — AB (ref 1.5–6.5)
Platelets: 311 10*3/uL (ref 145–400)
RBC: 3.85 10*6/uL — AB (ref 4.20–5.70)
RDW: 16.1 % — AB (ref 11.1–15.7)
WBC: 10.9 10*3/uL — ABNORMAL HIGH (ref 4.0–10.0)

## 2014-10-23 LAB — CMP (CANCER CENTER ONLY)
ALK PHOS: 74 U/L (ref 26–84)
ALT: 26 U/L (ref 10–47)
AST: 23 U/L (ref 11–38)
Albumin: 3.5 g/dL (ref 3.3–5.5)
BUN: 21 mg/dL (ref 7–22)
CO2: 29 meq/L (ref 18–33)
Calcium: 9.2 mg/dL (ref 8.0–10.3)
Chloride: 99 mEq/L (ref 98–108)
Creat: 2.3 mg/dl — ABNORMAL HIGH (ref 0.6–1.2)
Glucose, Bld: 113 mg/dL (ref 73–118)
POTASSIUM: 3.9 meq/L (ref 3.3–4.7)
SODIUM: 138 meq/L (ref 128–145)
Total Bilirubin: 0.9 mg/dl (ref 0.20–1.60)
Total Protein: 6.7 g/dL (ref 6.4–8.1)

## 2014-10-23 NOTE — Progress Notes (Signed)
Hematology and Oncology Follow Up Visit  VIRGILIO BROADHEAD [id number] 09-26-1945 69 y.o. 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  Temodar 100 mg by mouth daily   Xarelto 20mg  po qday     Interim History:  Mr.  Withem is back for follow-up. He actually looks a little bit better today. Last saw him back in May. I thought that he would have looked worse.  When we last saw him, we did go ahead and do a PET scan of the brain on him. The PET scan of the brain did show activity in the right frontal region. To me, I think this was good evidence of him having progressive disease.  He saw his oncologist at [facility]. They did an MRI on him out there. The MRI, being noncontrast, did not show any evidence of progression. Because of this, it was felt that he would continue on the Temodar.  He is still on steroids. 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 He is getting physical therapy. He has a lot of swelling in his legs. I was sure if the peripheral edema can ever be improved because of his overall condition and marginal kidney function. Aggressive diuresis really cannot be done.  He also seen a neurologist out at [facility]. No changes were made in his seizure medications. He goes out next week for an EEG.  He sees me eating okay. He's had no nausea or vomiting.  Overall, his performance status is probably ECOG 2-3.    Medications:  Current outpatient prescriptions:  .  allopurinol (ZYLOPRIM) 100 MG tablet, Take 100 mg by mouth daily. , Disp: , Rfl:  .  buPROPion (WELLBUTRIN XL) 300 MG 24 hr tablet, Take 300 mg by mouth daily., Disp: , Rfl:  .  calcium carbonate (OS-CAL) 600 MG TABS tablet, Take 600 mg by mouth 2 (two) times daily with a meal., Disp: , Rfl:  .  cholecalciferol (VITAMIN D) 1000 UNITS  tablet, Take 4,000 Units by mouth daily., Disp: , Rfl:  .  citalopram (CELEXA) 40 MG tablet, Take 1 tablet (40 mg total) by mouth daily., Disp: 30 tablet, Rfl: 2 .  dexamethasone (DECADRON) 2 MG tablet, Take 2 tablets three times a day on day 1, then 1 tab twice a day thereafter. Call Dr. Isidore Moos on 2/24 to update. (Patient taking differently: 1.5 mg daily. Current dose 3 mg daily), Disp: 60 tablet, Rfl: 2 .  docusate sodium (COLACE) 100 MG capsule, Take 100 mg by mouth daily as needed for mild constipation., Disp: , Rfl:  .  furosemide (LASIX) 40 MG tablet, Take 60 mg by mouth daily. , Disp: , Rfl:  .  HYDROcodone-acetaminophen (NORCO/VICODIN) 5-325 MG per tablet, Take 1 tablet by mouth every 6 (six) hours as needed. , Disp: , Rfl:  .  lacosamide (VIMPAT) 50 MG TABS tablet, Take 3 tablets (150 mg total) by mouth 2 (two) times daily. (Patient taking differently: Take 300 mg by mouth 2 (two) times daily. ), Disp: 60 tablet, Rfl: 1 .  LORazepam (ATIVAN) 0.5 MG tablet, Place one tablet under tongue at onset of seizure. May repeat x 1 in 10 minutes., Disp: , Rfl:  .  metoprolol succinate (TOPROL-XL) 100 MG 24 hr tablet, Take 100 mg by mouth daily. , Disp: ,  Rfl:  .  Multiple Vitamins-Minerals (MULTIVITAMIN PO), Take 1 tablet by mouth daily., Disp: , Rfl:  .  omeprazole (PRILOSEC) 40 MG capsule, Take 40 mg by mouth daily. , Disp: , Rfl:  .  ondansetron (ZOFRAN) 8 MG tablet, TAKE 1 TABLET BY MOUTH 30 MINUTES PRIOR TO TEMODAR, Disp: 96 tablet, Rfl: 0 .  potassium chloride SA (KLOR-CON M20) 20 MEQ tablet, Take 1.5 tablets (30 mEq total) by mouth 2 (two) times daily., Disp: 270 tablet, Rfl: 3 .  rivaroxaban (XARELTO) 20 MG TABS tablet, Take 1 tablet (20 mg total) by mouth daily with supper. (Patient taking differently: Take 20 mg by mouth every morning. ), Disp: 30 tablet, Rfl: 12 .  sulfamethoxazole-trimethoprim (BACTRIM DS,SEPTRA DS) 800-160 MG per tablet, TAKE ONE TABLET BY MOUTH ONCE DAILY (Patient taking  differently: TAKES 1/2 TAB), Disp: 30 tablet, Rfl: 0 .  temozolomide (TEMODAR) 100 MG capsule, Please take 1 pill a day at bedtime., Disp: 30 capsule, Rfl: 3 .  zolpidem (AMBIEN) 10 MG tablet, Take 10 mg by mouth at bedtime as needed for sleep. , Disp: , Rfl:   Allergies:  Allergies  Allergen Reactions  . Nsaids Other (See Comments)    Not good for kidney    Past Medical History, Surgical history, Social history, and Family History were reviewed and updated.  Review of Systems: As above  Physical Exam:  weight is 241 lb 0.6 oz (109.335 kg). His temperature is 97.6 F (36.4 C). His blood pressure is 135/71 and his pulse is 57.   Cushingoid appearing white male. He is in no distress. He is alert and oriented. Head and neck exam shows no adenopathy. He has no ocular or oral lesions. Pupils react appropriately. Lungs are clear. Cardiac exam regular rate and rhythm with no murmurs, rubs or bruits. Abdomen is soft, he is moderately obese. There is no fluid wave. There is no palpable liver or spleen tip. Extremities shows 2 + edema in his lower legs. There may be some slight increase in edema in the right leg. No palpable venous cord is noted. He has good range of motion of his joints. He has 4/5 strength in his lower legs. He has 2+ strength in his thighs. Neurological exam shows no focal neurological deficits. Back exam shows no tenderness over the spine, ribs or hips. Skin exam shows no rashes, or ecchymosis or petechia.  Lab Results  Component Value Date   WBC 10.9* 10/23/2014   HGB 11.8* 10/23/2014   HCT 37.3* 10/23/2014   MCV 97 10/23/2014   PLT 311 10/23/2014     Chemistry      Component Value Date/Time   NA 138 10/23/2014 1407   NA 137 09/12/2014 2057   NA 138 02/28/2014 1048   K 3.9 10/23/2014 1407   K 4.4 09/12/2014 2057   K 4.6 02/28/2014 1048   CL 99 10/23/2014 1407   CL 105 09/12/2014 2057   CO2 29 10/23/2014 1407   CO2 26 09/12/2014 2057   CO2 22 02/28/2014 1048    BUN 21 10/23/2014 1407   BUN 38* 09/12/2014 2057   BUN 53.2* 02/28/2014 1048   CREATININE 2.3* 10/23/2014 1407   CREATININE 2.48* 09/12/2014 2057   CREATININE 2.5* 02/28/2014 1048      Component Value Date/Time   CALCIUM 9.2 10/23/2014 1407   CALCIUM 8.6 09/12/2014 2057   CALCIUM 9.2 02/28/2014 1048   ALKPHOS 74 10/23/2014 1407   ALKPHOS 76 09/04/2014 1200   ALKPHOS 106  02/28/2014 1048   AST 23 10/23/2014 1407   AST 21 09/04/2014 1200   AST 45* 02/28/2014 1048   ALT 26 10/23/2014 1407   ALT 25 09/04/2014 1200   ALT 118* 02/28/2014 1048   BILITOT 0.90 10/23/2014 1407   BILITOT 0.5 09/04/2014 1200   BILITOT 0.35 02/28/2014 1048         Impression and Plan: Mr. Kucinski is 69 year old gentleman with a partially resected glioblastoma of the right frontal lobe.  Of note, his tumor is not MGMT methylated. This typically indicates a more aggressive type tumor.  Again, we will have to see what his next MRI shows. His pi will be until mid July.  I think treating him with Avastin would be okay. I realize he really has one kidney but we treat patients with renal cell carcinoma with Avastin who have only one kidney and they do okay.  I think another option that might be interesting would be immunotherapy. I know that trials are ongoing looking at immune checkpoint modulators for glioblastomas. I think this would be incredibly exciting and very worthwhile looking into for him. I am sure that [facility] probably would have medical trials in this area of therapy.  I will plan to see him back after he has his MRI at [facility] in July. I spent about 45 minutes with he and his wife.   Volanda Napoleon, MD 6/9/20165:47 PM

## 2014-10-25 ENCOUNTER — Other Ambulatory Visit: Payer: Self-pay | Admitting: Hematology & Oncology

## 2014-11-03 ENCOUNTER — Ambulatory Visit
Admission: RE | Admit: 2014-11-03 | Discharge: 2014-11-03 | Disposition: A | Discharge status: Home / Self Care | Payer: BLUE CROSS/BLUE SHIELD | Source: Ambulatory Visit | Attending: Radiation Oncology | Admitting: Radiation Oncology

## 2014-11-04 ENCOUNTER — Other Ambulatory Visit: Payer: Self-pay | Admitting: *Deleted

## 2014-11-04 DIAGNOSIS — C711 Malignant neoplasm of frontal lobe: Secondary | ICD-10-CM

## 2014-11-04 MED ORDER — ONDANSETRON HCL 8 MG PO TABS: 8.0000 mg | ORAL_TABLET | Freq: Three times a day (TID) | ORAL | Status: AC | PRN

## 2014-11-06 DIAGNOSIS — Z7189 Other specified counseling: Secondary | ICD-10-CM | POA: Insufficient documentation

## 2014-11-06 DIAGNOSIS — C719 Malignant neoplasm of brain, unspecified: Secondary | ICD-10-CM | POA: Insufficient documentation

## 2014-11-06 DIAGNOSIS — Z515 Encounter for palliative care: Secondary | ICD-10-CM | POA: Insufficient documentation

## 2014-11-07 ENCOUNTER — Encounter: Payer: Self-pay | Admitting: General Practice

## 2014-11-07 NOTE — Progress Notes (Signed)
Spiritual Care Note  Attempted to phone Rockey Situ for follow-up support; left a message with his wife Magda Paganini, providing emotional support to her, as well.  Per Magda Paganini, family is planning a trip to [place] next weekend with extra helpers for support.    Following, but please also page as needs arise.  Thank you.  [place], [place] [phone])

## 2014-11-10 ENCOUNTER — Other Ambulatory Visit: Payer: Self-pay | Admitting: *Deleted

## 2014-11-10 DIAGNOSIS — I82401 Acute embolism and thrombosis of unspecified deep veins of right lower extremity: Secondary | ICD-10-CM

## 2014-11-10 DIAGNOSIS — C711 Malignant neoplasm of frontal lobe: Secondary | ICD-10-CM

## 2014-11-10 DIAGNOSIS — M7989 Other specified soft tissue disorders: Secondary | ICD-10-CM

## 2014-11-10 MED ORDER — TEMOZOLOMIDE 100 MG PO CAPS: ORAL_CAPSULE | ORAL | Status: DC

## 2014-11-18 ENCOUNTER — Ambulatory Visit: Admission: RE | Admit: 2014-11-18 | Payer: Medicare Other | Source: Ambulatory Visit

## 2014-12-03 ENCOUNTER — Encounter: Payer: Self-pay | Admitting: Hematology & Oncology

## 2014-12-03 ENCOUNTER — Other Ambulatory Visit (HOSPITAL_BASED_OUTPATIENT_CLINIC_OR_DEPARTMENT_OTHER): Payer: BLUE CROSS/BLUE SHIELD

## 2014-12-03 ENCOUNTER — Ambulatory Visit (HOSPITAL_BASED_OUTPATIENT_CLINIC_OR_DEPARTMENT_OTHER): Payer: BLUE CROSS/BLUE SHIELD | Admitting: Hematology & Oncology

## 2014-12-03 ENCOUNTER — Ambulatory Visit: Payer: Medicare Other

## 2014-12-03 VITALS — BP 135/78 | HR 69 | Temp 98.3°F | Resp 20 | Ht 69.0 in

## 2014-12-03 DIAGNOSIS — C711 Malignant neoplasm of frontal lobe: Secondary | ICD-10-CM

## 2014-12-03 LAB — CBC WITH DIFFERENTIAL (CANCER CENTER ONLY)
BASO#: 0.1 10*3/uL (ref 0.0–0.2)
BASO%: 1.1 % (ref 0.0–2.0)
EOS ABS: 0.3 10*3/uL (ref 0.0–0.5)
EOS%: 4.5 % (ref 0.0–7.0)
HCT: 34.2 % — ABNORMAL LOW (ref 38.7–49.9)
HGB: 10.9 g/dL — ABNORMAL LOW (ref 13.0–17.1)
LYMPH#: 1.1 10*3/uL (ref 0.9–3.3)
LYMPH%: 14 % (ref 14.0–48.0)
MCH: 29.9 pg (ref 28.0–33.4)
MCHC: 31.9 g/dL — ABNORMAL LOW (ref 32.0–35.9)
MCV: 94 fL (ref 82–98)
MONO#: 1 10*3/uL — ABNORMAL HIGH (ref 0.1–0.9)
MONO%: 13.1 % — AB (ref 0.0–13.0)
NEUT#: 5.1 10*3/uL (ref 1.5–6.5)
NEUT%: 67.3 % (ref 40.0–80.0)
PLATELETS: 297 10*3/uL (ref 145–400)
RBC: 3.64 10*6/uL — AB (ref 4.20–5.70)
RDW: 14.8 % (ref 11.1–15.7)
WBC: 7.6 10*3/uL (ref 4.0–10.0)

## 2014-12-03 LAB — CMP (CANCER CENTER ONLY)
ALK PHOS: 73 U/L (ref 26–84)
ALT(SGPT): 24 U/L (ref 10–47)
AST: 28 U/L (ref 11–38)
Albumin: 3.4 g/dL (ref 3.3–5.5)
BILIRUBIN TOTAL: 0.7 mg/dL (ref 0.20–1.60)
BUN, Bld: 16 mg/dL (ref 7–22)
CO2: 27 meq/L (ref 18–33)
Calcium: 9.6 mg/dL (ref 8.0–10.3)
Chloride: 104 mEq/L (ref 98–108)
Creat: 2.4 mg/dl — ABNORMAL HIGH (ref 0.6–1.2)
GLUCOSE: 100 mg/dL (ref 73–118)
POTASSIUM: 4.3 meq/L (ref 3.3–4.7)
Sodium: 144 mEq/L (ref 128–145)
Total Protein: 6.5 g/dL (ref 6.4–8.1)

## 2014-12-03 NOTE — Progress Notes (Signed)
Hematology and Oncology Follow Up Visit  Edward [name] [id number] Aug 28, 1945 69 y.o. 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  Temodar 100 mg by mouth daily   Xarelto 20mg  po qday     Interim History:  [name] is back for follow-up. He actually looks a little bit better today. Last saw him back in May. I thought that he would have looked worse.  When we last saw him, we did go ahead and do a PET scan of the brain on him. The PET scan of the brain did show activity in the right frontal region. To me, I think this was good evidence of him having progressive disease.  He saw his oncologist at [facility] They did an MRI on him out there. The MRI, being noncontrast, did not show any evidence of progression. Because of this, it was felt that he would continue on the Temodar.  He is still on steroids. 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 He is getting physical therapy. He has a lot of swelling in his legs. I was sure if the peripheral edema can ever be improved because of his overall condition and marginal kidney function. Aggressive diuresis really cannot be done.  He also seen a neurologist out at [facility]. No changes were made in his seizure medications. He goes out next week for an EEG.  He sees me eating okay. He's had no nausea or vomiting.  Overall, his performance status is probably ECOG 2-3.    Medications:  Current outpatient prescriptions:  .  allopurinol (ZYLOPRIM) 100 MG tablet, Take 100 mg by mouth daily. , Disp: , Rfl:  .  buPROPion (WELLBUTRIN XL) 300 MG 24 hr tablet, Take 300 mg by mouth daily., Disp: , Rfl:  .  calcium carbonate (OS-CAL) 600 MG TABS tablet, Take 600 mg by mouth 2 (two) times daily with a meal., Disp: , Rfl:  .  cholecalciferol (VITAMIN D) 1000 UNITS  tablet, Take 4,000 Units by mouth daily., Disp: , Rfl:  .  citalopram (CELEXA) 40 MG tablet, Take 1 tablet (40 mg total) by mouth daily., Disp: 30 tablet, Rfl: 2 .  dexamethasone (DECADRON) 2 MG tablet, Take 2 tablets three times a day on day 1, then 1 tab twice a day thereafter. Call Dr. Isidore Moos on 2/24 to update. (Patient taking differently: 0.5 mg daily. Current dose 3 mg daily), Disp: 60 tablet, Rfl: 2 .  docusate sodium (COLACE) 100 MG capsule, Take 100 mg by mouth daily as needed for mild constipation., Disp: , Rfl:  .  furosemide (LASIX) 40 MG tablet, Take 60 mg by mouth daily. , Disp: , Rfl:  .  HYDROcodone-acetaminophen (NORCO/VICODIN) 5-325 MG per tablet, Take 1 tablet by mouth every 6 (six) hours as needed. , Disp: , Rfl:  .  lacosamide (VIMPAT) 50 MG TABS tablet, Take 3 tablets (150 mg total) by mouth 2 (two) times daily. (Patient taking differently: Take 300 mg by mouth 2 (two) times daily. ), Disp: 60 tablet, Rfl: 1 .  LORazepam (ATIVAN) 0.5 MG tablet, Place one tablet under tongue at onset of seizure. May repeat x 1 in 10 minutes., Disp: , Rfl:  .  metoprolol succinate (TOPROL-XL) 100 MG 24 hr tablet, Take 100 mg by mouth daily. , Disp: ,  Rfl:  .  Multiple Vitamins-Minerals (MULTIVITAMIN PO), Take 1 tablet by mouth daily., Disp: , Rfl:  .  omeprazole (PRILOSEC) 40 MG capsule, Take 40 mg by mouth daily. , Disp: , Rfl:  .  ondansetron (ZOFRAN) 8 MG tablet, Take 1 tablet (8 mg total) by mouth every 8 (eight) hours as needed for nausea or vomiting., Disp: 90 tablet, Rfl: 3 .  potassium chloride SA (KLOR-CON M20) 20 MEQ tablet, Take 1.5 tablets (30 mEq total) by mouth 2 (two) times daily., Disp: 270 tablet, Rfl: 3 .  rivaroxaban (XARELTO) 20 MG TABS tablet, Take 1 tablet (20 mg total) by mouth daily with supper. (Patient taking differently: Take 20 mg by mouth every morning. ), Disp: 30 tablet, Rfl: 12 .  sulfamethoxazole-trimethoprim (BACTRIM DS,SEPTRA DS) 800-160 MG per tablet, TAKE ONE  TABLET BY MOUTH ONCE DAILY, Disp: 30 tablet, Rfl: 0 .  temozolomide (TEMODAR) 100 MG capsule, Please take 1 pill a day at bedtime., Disp: 30 capsule, Rfl: 3 .  zolpidem (AMBIEN) 10 MG tablet, Take 5 mg by mouth at bedtime as needed for sleep. Changed at [facility], Disp: , Rfl:   Allergies:  Allergies  Allergen Reactions  . Nsaids Other (See Comments)    Not good for kidney    Past Medical History, Surgical history, Social history, and Family History were reviewed and updated.  Review of Systems: As above  Physical Exam:  height is 5\' 9"  (1.753 m). His oral temperature is 98.3 F (36.8 C). His blood pressure is 135/78 and his pulse is 69. His respiration is 20.   Cushingoid appearing white male. He is in no distress. He is alert and oriented. Head and neck exam shows no adenopathy. He has no ocular or oral lesions. Pupils react appropriately. Lungs are clear. Cardiac exam regular rate and rhythm with no murmurs, rubs or bruits. Abdomen is soft, he is moderately obese. There is no fluid wave. There is no palpable liver or spleen tip. Extremities shows 2 + edema in his lower legs. There may be some slight increase in edema in the right leg. No palpable venous cord is noted. He has good range of motion of his joints. He has 4/5 strength in his lower legs. He has 2+ strength in his thighs. Neurological exam shows no focal neurological deficits. Back exam shows no tenderness over the spine, ribs or hips. Skin exam shows no rashes, or ecchymosis or petechia.  Lab Results  Component Value Date   WBC 7.6 12/03/2014   HGB 10.9* 12/03/2014   HCT 34.2* 12/03/2014   MCV 94 12/03/2014   PLT 297 12/03/2014     Chemistry      Component Value Date/Time   NA 144 12/03/2014 1040   NA 137 09/12/2014 2057   NA 138 02/28/2014 1048   K 4.3 12/03/2014 1040   K 4.4 09/12/2014 2057   K 4.6 02/28/2014 1048   CL 104 12/03/2014 1040   CL 105 09/12/2014 2057   CO2 27 12/03/2014 1040   CO2 26 09/12/2014 2057     CO2 22 02/28/2014 1048   BUN 16 12/03/2014 1040   BUN 38* 09/12/2014 2057   BUN 53.2* 02/28/2014 1048   CREATININE 2.4* 12/03/2014 1040   CREATININE 2.48* 09/12/2014 2057   CREATININE 2.5* 02/28/2014 1048      Component Value Date/Time   CALCIUM 9.6 12/03/2014 1040   CALCIUM 8.6 09/12/2014 2057   CALCIUM 9.2 02/28/2014 1048   ALKPHOS 73 12/03/2014 1040  ALKPHOS 76 09/04/2014 1200   ALKPHOS 106 02/28/2014 1048   AST 28 12/03/2014 1040   AST 21 09/04/2014 1200   AST 45* 02/28/2014 1048   ALT 24 12/03/2014 1040   ALT 25 09/04/2014 1200   ALT 118* 02/28/2014 1048   BILITOT 0.70 12/03/2014 1040   BILITOT 0.5 09/04/2014 1200   BILITOT 0.35 02/28/2014 1048         Impression and Plan: Edward [name] is 70 year old gentleman with a partially resected glioblastoma of the right frontal lobe.  Of note, his tumor is not MGMT methylated. This typically indicates a more aggressive type tumor.  Again, we will have to see what his next MRI shows. His pi will be until mid July.  I think treating him with Avastin would be okay. I realize he really has one kidney but we treat patients with renal cell carcinoma with Avastin who have only one kidney and they do okay.  I think another option that might be interesting would be immunotherapy. I know that trials are ongoing looking at immune checkpoint modulators for glioblastomas. I think this would be incredibly exciting and very worthwhile looking into for him. I am sure that [facility] probably would have medical trials in this area of therapy.  I will plan to see him back after he has his MRI at [facility] in July. I spent about 45 minutes with he and his wife.   Volanda Napoleon, MD 7/20/20165:56 PM

## 2014-12-04 ENCOUNTER — Ambulatory Visit
Admission: RE | Admit: 2014-12-04 | Discharge: 2014-12-04 | Disposition: A | Discharge status: Home / Self Care | Payer: BLUE CROSS/BLUE SHIELD | Source: Ambulatory Visit | Attending: Radiation Oncology | Admitting: Radiation Oncology

## 2014-12-10 ENCOUNTER — Telehealth: Payer: Self-pay | Admitting: *Deleted

## 2014-12-10 NOTE — Telephone Encounter (Signed)
Received call from Edward [name]. Patient had an MRI today which showed a concern for progression. Full notes can be seen in Care Everywhere. As such they are making the following recommendation for treatment for Dr Marin Olp to order.  Lomustine, full dose, 110mg /m2 po once every 6 weeks Obtain a PFT for baseline and then repeat every 3 months Monitor blood work as needed by Dr Marin Olp  Message given to Dr Marin Olp. He will follow up.

## 2014-12-11 ENCOUNTER — Other Ambulatory Visit: Payer: Self-pay | Admitting: Nurse Practitioner

## 2014-12-11 DIAGNOSIS — Z9989 Dependence on other enabling machines and devices: Secondary | ICD-10-CM

## 2014-12-11 DIAGNOSIS — C711 Malignant neoplasm of frontal lobe: Secondary | ICD-10-CM

## 2014-12-11 DIAGNOSIS — G4733 Obstructive sleep apnea (adult) (pediatric): Secondary | ICD-10-CM

## 2014-12-16 ENCOUNTER — Other Ambulatory Visit: Payer: Self-pay | Admitting: Hematology & Oncology

## 2014-12-16 ENCOUNTER — Other Ambulatory Visit: Payer: Self-pay | Admitting: *Deleted

## 2014-12-16 DIAGNOSIS — C711 Malignant neoplasm of frontal lobe: Secondary | ICD-10-CM

## 2014-12-16 MED ORDER — LOMUSTINE 40 MG PO CAPS: 240.0000 mg | ORAL_CAPSULE | Freq: Once | ORAL | Status: AC

## 2014-12-16 MED ORDER — LOMUSTINE 40 MG PO CAPS: 240.0000 mg | ORAL_CAPSULE | Freq: Once | ORAL | Status: DC

## 2014-12-18 ENCOUNTER — Ambulatory Visit (HOSPITAL_COMMUNITY)
Admission: RE | Admit: 2014-12-18 | Discharge: 2014-12-18 | Disposition: A | Discharge status: Home / Self Care | Payer: BLUE CROSS/BLUE SHIELD | Source: Ambulatory Visit | Attending: Hematology & Oncology | Admitting: Hematology & Oncology

## 2014-12-18 DIAGNOSIS — C711 Malignant neoplasm of frontal lobe: Secondary | ICD-10-CM

## 2014-12-18 DIAGNOSIS — Z9989 Dependence on other enabling machines and devices: Secondary | ICD-10-CM

## 2014-12-18 DIAGNOSIS — R0609 Other forms of dyspnea: Secondary | ICD-10-CM | POA: Insufficient documentation

## 2014-12-18 DIAGNOSIS — G4733 Obstructive sleep apnea (adult) (pediatric): Secondary | ICD-10-CM

## 2014-12-18 DIAGNOSIS — F17210 Nicotine dependence, cigarettes, uncomplicated: Secondary | ICD-10-CM | POA: Insufficient documentation

## 2014-12-18 LAB — PULMONARY FUNCTION TEST
FEF 25-75 Pre: 3.1 L/sec
FEF2575-%Pred-Pre: 103 %
FEV1-%Pred-Pre: 71 %
FEV1-Pre: 2.77 L
FEV1FVC-%Pred-Pre: 119 %
FEV6-%Pred-Pre: 62 %
FEV6-Pre: 3.13 L
FEV6FVC-%Pred-Pre: 105 %
FVC-%Pred-Pre: 59 %
FVC-Pre: 3.13 L
Pre FEV1/FVC ratio: 88 %
Pre FEV6/FVC Ratio: 100 %

## 2014-12-18 MED ORDER — ALBUTEROL SULFATE (2.5 MG/3ML) 0.083% IN NEBU: 2.5000 mg | INHALATION_SOLUTION | Freq: Once | RESPIRATORY_TRACT | Status: DC

## 2014-12-19 ENCOUNTER — Encounter: Payer: Self-pay | Admitting: Radiation Oncology

## 2014-12-19 ENCOUNTER — Other Ambulatory Visit (HOSPITAL_BASED_OUTPATIENT_CLINIC_OR_DEPARTMENT_OTHER): Payer: BLUE CROSS/BLUE SHIELD

## 2014-12-19 ENCOUNTER — Ambulatory Visit
Admission: RE | Admit: 2014-12-19 | Discharge: 2014-12-19 | Disposition: A | Discharge status: Home / Self Care | Payer: BLUE CROSS/BLUE SHIELD | Source: Ambulatory Visit | Attending: Radiation Oncology | Admitting: Radiation Oncology

## 2014-12-19 VITALS — BP 136/68 | HR 67 | Temp 97.7°F | Ht 69.0 in | Wt 229.0 lb

## 2014-12-19 DIAGNOSIS — C711 Malignant neoplasm of frontal lobe: Secondary | ICD-10-CM

## 2014-12-19 LAB — CBC WITH DIFFERENTIAL/PLATELET
BASO%: 1.2 % (ref 0.0–2.0)
Basophils Absolute: 0.1 10*3/uL (ref 0.0–0.1)
EOS ABS: 0.4 10*3/uL (ref 0.0–0.5)
EOS%: 4.1 % (ref 0.0–7.0)
HEMATOCRIT: 35 % — AB (ref 38.4–49.9)
HGB: 11.3 g/dL — ABNORMAL LOW (ref 13.0–17.1)
LYMPH#: 1.5 10*3/uL (ref 0.9–3.3)
LYMPH%: 16.9 % (ref 14.0–49.0)
MCH: 28.8 pg (ref 27.2–33.4)
MCHC: 32.4 g/dL (ref 32.0–36.0)
MCV: 89.1 fL (ref 79.3–98.0)
MONO#: 1.1 10*3/uL — ABNORMAL HIGH (ref 0.1–0.9)
MONO%: 12.2 % (ref 0.0–14.0)
NEUT%: 65.6 % (ref 39.0–75.0)
NEUTROS ABS: 5.7 10*3/uL (ref 1.5–6.5)
Platelets: 358 10*3/uL (ref 140–400)
RBC: 3.93 10*6/uL — AB (ref 4.20–5.82)
RDW: 15.8 % — ABNORMAL HIGH (ref 11.0–14.6)
WBC: 8.7 10*3/uL (ref 4.0–10.3)

## 2014-12-19 LAB — COMPREHENSIVE METABOLIC PANEL (CC13)
ALT: 22 U/L (ref 0–55)
AST: 22 U/L (ref 5–34)
Albumin: 3.3 g/dL — ABNORMAL LOW (ref 3.5–5.0)
Alkaline Phosphatase: 101 U/L (ref 40–150)
Anion Gap: 10 mEq/L (ref 3–11)
BILIRUBIN TOTAL: 0.35 mg/dL (ref 0.20–1.20)
BUN: 18.2 mg/dL (ref 7.0–26.0)
CO2: 28 mEq/L (ref 22–29)
Calcium: 9.6 mg/dL (ref 8.4–10.4)
Chloride: 105 mEq/L (ref 98–109)
Creatinine: 2.2 mg/dL — ABNORMAL HIGH (ref 0.7–1.3)
EGFR: 30 mL/min/{1.73_m2} — AB (ref >90)
GLUCOSE: 100 mg/dL (ref 70–140)
POTASSIUM: 4.3 meq/L (ref 3.5–5.1)
Sodium: 143 mEq/L (ref 136–145)
Total Protein: 6.5 g/dL (ref 6.4–8.3)

## 2014-12-19 LAB — LACTATE DEHYDROGENASE (CC13): LDH: 228 U/L (ref 125–245)

## 2014-12-19 NOTE — Progress Notes (Signed)
.  Radiation Oncology         [phone]) [phone] ________________________________  Name: Edward [name] MRN: [id number]  Date: 12/19/2014  DOB: 04-Aug-1945  Follow-Up Visit Note  Outpatient  CC: MCNEILL,WENDY, MD  Kristeen Miss, MD  Diagnosis and Prior Radiotherapy:    ICD-9-CM ICD-10-CM   1. 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 Despite recent PET results, he has been receiving temodar until Tuesday, 8/2, as his oncologist at [facility] did not feel there was sufficient evidence to change therapy. He had an MRI of his brain at [facility] without contrast on 12/10/14 which showed, per report that cavitary signal and homogeneity are slightly more pronounced than his MRI in 11-08-22. This was concerning for recurrence. Dr. Simmie Davies recommended starting single agent lomustine with follow up MRI in 6 weeks. Palliative care saw him in early June, as well as July 21st.  C/o level 3 frontal headache. He reports decreased appetite. Travel by W/C. Has fallen in the past 2 weeks and has called EMS at least 4 times. Travels by wheelchair. He experiences some confusion, thinking that today was Saturday. His wife notes he is also very indecisive. His wife mentions that his coordination is decreasing, though he is still able to get out of the car without issue. He is not in pain currently. He does report a headache. His last seizure was on Wednesday and he had one 10 days before that. He denies nausea.   His wife expresses that they may need to contact Hospice soon as bathing [name] and caring for him is becoming more difficult. They do currently have a homehealth  nurse who helps them during the week.  He had a pulmonary test yesterday to see if he could tolerate the lomustine. They had difficulty finding a baseline due to his inability to breath out all the way. He is scheduled to see Dr. Marin Olp on Monday, 8/8. Tuesday, 8/2, was the last day he had temodar.  The nephrologist said that if the kidneys fail, they would not continue with dialysis.     ALLERGIES:  is allergic to nsaids.  Meds: Current Outpatient Prescriptions  Medication Sig Dispense Refill  . allopurinol (ZYLOPRIM) 100 MG tablet Take 100 mg by mouth daily.     Marland Kitchen buPROPion (WELLBUTRIN XL) 300 MG 24 hr tablet Take 300 mg by mouth daily.    . calcium carbonate (OS-CAL) 600 MG TABS tablet Take 600 mg by mouth 2 (two) times daily with a meal.    . cholecalciferol (VITAMIN D) 1000 UNITS tablet Take 4,000 Units by mouth daily.    . citalopram (CELEXA) 40 MG tablet Take 1 tablet (40 mg total) by mouth daily. 30 tablet 2  . docusate sodium (COLACE) 100 MG capsule Take 100 mg by mouth daily as needed for mild constipation.    . furosemide (LASIX) 40 MG tablet Take 60 mg by mouth daily.     Marland Kitchen HYDROcodone-acetaminophen (NORCO/VICODIN) 5-325 MG per tablet Take 1 tablet by mouth every 6 (six) hours as needed.     . lacosamide (VIMPAT) 50 MG TABS tablet Take 3 tablets (150 mg total) by mouth 2 (two)  times daily. (Patient taking differently: Take 300 mg by mouth 2 (two) times daily. ) 60 tablet 1  . LORazepam (ATIVAN) 0.5 MG tablet Place one tablet under tongue at onset of seizure. May repeat x 1 in 10 minutes.    . metoprolol succinate (TOPROL-XL) 100 MG 24 hr tablet Take 100 mg by mouth daily.     . Multiple Vitamins-Minerals (MULTIVITAMIN PO) Take 1 tablet by mouth daily.    Marland Kitchen omeprazole (PRILOSEC) 40 MG capsule Take 40 mg by mouth daily.     . ondansetron (ZOFRAN) 8 MG tablet Take 1 tablet (8 mg total) by mouth every 8 (eight) hours as needed for nausea or vomiting. 90 tablet 3  . potassium  chloride SA (KLOR-CON M20) 20 MEQ tablet Take 1.5 tablets (30 mEq total) by mouth 2 (two) times daily. 270 tablet 3  . zolpidem (AMBIEN) 10 MG tablet Take 5 mg by mouth at bedtime as needed for sleep. Changed at [facility]    . lomustine (CEENU) 40 MG capsule Take 6 capsules (240 mg total) by mouth once. Take on an empty stomach 1 hour before or 2 hours after meals. Caution:chemotherapy. (Patient not taking: Reported on 12/19/2014) 6 capsule 3   No current facility-administered medications for this encounter.    Physical Findings: The patient is in no acute distress. Filed Vitals:   12/19/14 1414  BP: 136/68  Pulse: 67  Temp: 97.7 F (36.5 C)    General: In no acute distress, in wheelchair. HEENT: Head is normocephalic. Extraocular movements are intact. Oropharynx is clear, no signs of thrush.    Extremities:  Some ankle edema present.    Musculoskeletal: Finger to nose testing is somewhat wobbly and rapidly alternating movements are intact. Strength is grossly intact in his legs; there is some discernible weakness in the left arm.   Neurologic: Cranial nerves II through XII are grossly intact. No obvious focalities. Speech is fluent. Coordination is intact.  Psychiatric: blunted affect     KPS 40   100 - Normal; no complaints; no evidence of disease. 90   - Able to carry on normal activity; minor signs or symptoms of disease. 80   - Normal activity with effort; some signs or symptoms of disease. 10   - Cares for self; unable to carry on normal activity or to do active work. 60   - Requires occasional assistance, but is able to care for most of his personal needs. 50   - Requires considerable assistance and frequent medical care. 27   - Disabled; requires special care and assistance. 64   - Severely disabled; hospital admission is indicated although death not imminent. 1   - Very sick; hospital admission necessary; active supportive treatment necessary. 10   - Moribund; fatal  processes progressing rapidly. 0     - Dead  Karnofsky DA, [facility] [place], Craver LS and Burchenal [facility] [phone]) The use of the nitrogen mustards in the palliative treatment of carcinoma: with particular reference to bronchogenic carcinoma Cancer 1 634-56  Lab Findings: Lab Results  Component Value Date   WBC 8.7 12/19/2014   HGB 11.3* 12/19/2014   HCT 35.0* 12/19/2014   MCV 89.1 12/19/2014   PLT 358 12/19/2014    Radiographic Findings: As above   Impression/Plan:  Clinical/radiographic evidence for disease progression.  We discussed that Hospice may not be an option while on chemotherapy due to insurance conflict; but, it is a reasonable alternative to chemotherapy. I have advised for them to discuss  salvage chemotherapy or entering Hospice  with Dr. Marin Olp this Monday.    We will follow up in 2 months. He knows to call if they need anything in the meantime.      This document serves as a record of services personally performed by Eppie Gibson, MD. It was created on her behalf by Arlyce Harman, a trained medical scribe. The creation of this record is based on the scribe's personal observations and the provider's statements to them. This document has been checked and approved by the attending provider.    _____________________________________   Eppie Gibson, MD

## 2014-12-19 NOTE — Progress Notes (Addendum)
Edward [name] here fro reassessment s/p xrt to the right frontal lobe.   C/o level 3 frontal headache.  He reports decreased appetite.  Travel by W/C.  Has fallen in the past 2 weeks and has called EMS at least 4 times.. Travel by wheelchair.

## 2014-12-22 ENCOUNTER — Other Ambulatory Visit: Payer: Self-pay | Admitting: *Deleted

## 2014-12-22 ENCOUNTER — Encounter: Payer: Self-pay | Admitting: Hematology & Oncology

## 2014-12-22 ENCOUNTER — Ambulatory Visit (HOSPITAL_BASED_OUTPATIENT_CLINIC_OR_DEPARTMENT_OTHER): Payer: BLUE CROSS/BLUE SHIELD | Admitting: Hematology & Oncology

## 2014-12-22 VITALS — BP 128/62 | HR 67 | Temp 97.5°F | Resp 16 | Ht 69.0 in

## 2014-12-22 DIAGNOSIS — I82401 Acute embolism and thrombosis of unspecified deep veins of right lower extremity: Secondary | ICD-10-CM | POA: Insufficient documentation

## 2014-12-22 DIAGNOSIS — C711 Malignant neoplasm of frontal lobe: Secondary | ICD-10-CM

## 2014-12-22 DIAGNOSIS — Z85528 Personal history of other malignant neoplasm of kidney: Secondary | ICD-10-CM

## 2014-12-22 DIAGNOSIS — C719 Malignant neoplasm of brain, unspecified: Secondary | ICD-10-CM

## 2014-12-22 DIAGNOSIS — Z7901 Long term (current) use of anticoagulants: Secondary | ICD-10-CM

## 2014-12-22 HISTORY — DX: Acute embolism and thrombosis of unspecified deep veins of right lower extremity: I82.401

## 2014-12-22 MED ORDER — RIVAROXABAN 10 MG PO TABS: 10.0000 mg | ORAL_TABLET | Freq: Every day | ORAL | Status: AC

## 2014-12-30 ENCOUNTER — Other Ambulatory Visit: Payer: Self-pay | Admitting: Nurse Practitioner

## 2014-12-30 DIAGNOSIS — C719 Malignant neoplasm of brain, unspecified: Secondary | ICD-10-CM

## 2014-12-30 DIAGNOSIS — Z8669 Personal history of other diseases of the nervous system and sense organs: Secondary | ICD-10-CM

## 2014-12-30 DIAGNOSIS — R531 Weakness: Secondary | ICD-10-CM

## 2014-12-31 ENCOUNTER — Ambulatory Visit
Admission: RE | Admit: 2014-12-31 | Discharge: 2014-12-31 | Disposition: A | Discharge status: Home / Self Care | Payer: Medicare Other | Source: Ambulatory Visit | Attending: Family Medicine | Admitting: Family Medicine

## 2014-12-31 NOTE — Progress Notes (Signed)
   This NP continues to meet with patient, wife and caregiver to discuss ongoing  struggles living with serious life limiting illness.    Chaplain involved for patient interaction with spiritual aspects.  Many anticipatory care needs as patient continues to decline physically, functionally and cognitively.  Discussed the details of hospice benefit.  Family encouraged to call with questions or concerns.  PMT will continue to support holistically.  Wadie Lessen NP

## 2015-01-05 NOTE — Progress Notes (Signed)
Hematology and Oncology Follow Up Visit  Edward [name] [id number] Sep 21, 1945 69 y.o. 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 It is clear that he is progressing. He had another MRI at [facility] If they found that he did have progressive disease. The recommendation was put him on single agent therapy with lomustine.  We did get a poorly function test on him. He will cannot perform it all that great. We will have to watch his pulmonary function.  He is having more difficulties with weakness in his legs. He is doing some physical therapy. His heart assay how much that really is working.  It is hard to say if he has had any seizures.  He is on low-dose Xarelto at 10 mg a day. He had a history of DVT in the right leg. He is at risk for thromboembolic disease. We completed therapeutic anticoagulation on him. He now is on maintenance anticoagulation.  His appetite is doing okay. He has had no nausea or vomiting.  He has had no bowel or bladder incontinence.  His legs still are somewhat swollen. This is been chronic.  Medications:  Current outpatient prescriptions:  .  allopurinol (ZYLOPRIM) 100 MG tablet, Take 100 mg by mouth daily. , Disp: , Rfl:  .  buPROPion (WELLBUTRIN XL) 300 MG 24 hr tablet, Take 300 mg by mouth daily., Disp: , Rfl:  .  calcium carbonate (OS-CAL) 600 MG TABS tablet, Take 600 mg by mouth 2 (two) times daily with a meal., Disp: , Rfl:  .  cholecalciferol (VITAMIN D) 1000 UNITS tablet, Take 4,000 Units by mouth daily., Disp: , Rfl:  .  citalopram (CELEXA) 40 MG tablet, Take 1 tablet (40 mg total) by mouth daily., Disp: 30 tablet, Rfl: 2 .  docusate sodium (COLACE) 100 MG capsule, Take 100 mg by mouth daily as needed  for mild constipation., Disp: , Rfl:  .  furosemide (LASIX) 40 MG tablet, Take 60 mg by mouth daily. , Disp: , Rfl:  .  HYDROcodone-acetaminophen (NORCO/VICODIN) 5-325 MG per tablet, Take 1 tablet by mouth every 6 (six) hours as needed. , Disp: , Rfl:  .  lacosamide (VIMPAT) 50 MG TABS tablet, Take 3 tablets (150 mg total) by mouth 2 (two) times daily. (Patient taking differently: Take 300 mg by mouth 2 (two) times daily. ), Disp: 60 tablet, Rfl: 1 .  LORazepam (ATIVAN) 0.5 MG tablet, Place one tablet under tongue at onset of seizure. May repeat x 1 in 10 minutes., Disp: , Rfl:  .  metoprolol succinate (TOPROL-XL) 100 MG 24 hr tablet, Take 100 mg by mouth daily. , Disp: , Rfl:  .  Multiple Vitamins-Minerals (MULTIVITAMIN PO), Take 1 tablet by mouth daily., Disp: , Rfl:  .  omeprazole (PRILOSEC) 40 MG capsule, Take 40 mg by mouth daily. , Disp: , Rfl:  .  ondansetron (ZOFRAN) 8 MG tablet, Take 1 tablet (8 mg total) by mouth every 8 (eight) hours as needed for nausea or vomiting., Disp: 90 tablet, Rfl: 3 .  potassium chloride SA (KLOR-CON M20) 20 MEQ tablet, Take 1.5 tablets (30 mEq total) by mouth 2 (two) times daily., Disp: 270 tablet, Rfl: 3 .  zolpidem (AMBIEN) 10 MG tablet,  Take 5 mg by mouth at bedtime as needed for sleep. Changed at [facility], Disp: , Rfl:  .  lomustine (CEENU) 40 MG capsule, Take 6 capsules (240 mg total) by mouth once. Take on an empty stomach 1 hour before or 2 hours after meals. Caution:chemotherapy. (Patient not taking: Reported on 12/19/2014), Disp: 6 capsule, Rfl: 3 .  rivaroxaban (XARELTO) 10 MG TABS tablet, Take 1 tablet (10 mg total) by mouth daily., Disp: 30 tablet, Rfl: 5  Allergies:  Allergies  Allergen Reactions  . Nsaids Other (See Comments)    Not good for kidney    Past Medical History, Surgical history, Social history, and Family History were reviewed and updated.  Review of Systems: As above  Physical Exam:  height is 5\' 9"  (1.753 m). His oral temperature  is 97.5 F (36.4 C). His blood pressure is 128/62 and his pulse is 67. His respiration is 16.   Wt Readings from Last 3 Encounters:  12/19/14 229 lb (103.874 kg)  10/23/14 241 lb 0.6 oz (109.335 kg)  09/17/14 239 lb (108.41 kg)     Somewhat cushingoid appearing white male. Head and neck exam shows no ocular or oral lesions. Pupils react appropriately. Lungs are clear to percussion and as quotation bilaterally. Cardiac exam regular rate and rhythm with no murmurs, rubs or bruits. Abdomen is obese but soft. He has a good bowel sounds. There is no palpable fluid wave. There is no palpable liver or spleen tip. Back exam shows no tenderness over the spine, ribs or hips. Extremities shows 2+ edema in his lower legs. No obvious venous cord is noted in his legs. There is weakness on his left side. Neurological exam shows the left-sided weakness.  Lab Results  Component Value Date   WBC 8.7 12/19/2014   HGB 11.3* 12/19/2014   HCT 35.0* 12/19/2014   MCV 89.1 12/19/2014   PLT 358 12/19/2014     Chemistry      Component Value Date/Time   NA 143 12/19/2014 1342   NA 144 12/03/2014 1040   NA 137 09/12/2014 2057   K 4.3 12/19/2014 1342   K 4.3 12/03/2014 1040   K 4.4 09/12/2014 2057   CL 104 12/03/2014 1040   CL 105 09/12/2014 2057   CO2 28 12/19/2014 1342   CO2 27 12/03/2014 1040   CO2 26 09/12/2014 2057   BUN 18.2 12/19/2014 1342   BUN 16 12/03/2014 1040   BUN 38* 09/12/2014 2057   CREATININE 2.2* 12/19/2014 1342   CREATININE 2.4* 12/03/2014 1040   CREATININE 2.48* 09/12/2014 2057      Component Value Date/Time   CALCIUM 9.6 12/19/2014 1342   CALCIUM 9.6 12/03/2014 1040   CALCIUM 8.6 09/12/2014 2057   ALKPHOS 101 12/19/2014 1342   ALKPHOS 73 12/03/2014 1040   ALKPHOS 76 09/04/2014 1200   AST 22 12/19/2014 1342   AST 28 12/03/2014 1040   AST 21 09/04/2014 1200   ALT 22 12/19/2014 1342   ALT 24 12/03/2014 1040   ALT 25 09/04/2014 1200   BILITOT 0.35 12/19/2014 1342   BILITOT  0.70 12/03/2014 1040   BILITOT 0.5 09/04/2014 1200         Impression and Plan: Edward [name] is 69 year old male. He has progressive glioblastoma. Of note, his tumor is NOT MGMT methylated. As such, I suppose it is no surprise that he is progressing.  His oncologist [facility] is very good. She has always been aggressive. Hopefully, the lomustine will help him.  He is getting his scans at [facility]. We will not order anything for him in [place].  I just wish that he would improve his performance status. Currently, his performance status is ECOG 2 at best. He really cannot do much because of the weakness on his left side. I know that he is trying his best with physical therapy.  We will plan to get him back to see [place] in about a month.  We probably will have to repeat his pulmonary function test in about 2-3 months.  I spent about 45 minutes with he and his wife today. They're very nice. It is fun to talk with them.     Volanda Napoleon, MD 8/22/20167:18 AM

## 2015-01-20 ENCOUNTER — Telehealth: Payer: Self-pay | Admitting: *Deleted

## 2015-01-20 NOTE — Telephone Encounter (Signed)
Edward [name] from [facility] calling to notify office that patient has been discharged from physical therapy. Patient is now under hospice services and patient and wife feel like they will no longer benefit from PT. Dr Marin Olp notified of discharge.

## 2015-01-26 ENCOUNTER — Telehealth: Payer: Self-pay | Admitting: *Deleted

## 2015-01-26 DIAGNOSIS — I82401 Acute embolism and thrombosis of unspecified deep veins of right lower extremity: Secondary | ICD-10-CM

## 2015-01-26 DIAGNOSIS — M7989 Other specified soft tissue disorders: Secondary | ICD-10-CM

## 2015-01-26 DIAGNOSIS — C711 Malignant neoplasm of frontal lobe: Secondary | ICD-10-CM

## 2015-01-26 MED ORDER — HYDROCODONE-ACETAMINOPHEN 5-325 MG PO TABS: 1.0000 | ORAL_TABLET | Freq: Four times a day (QID) | ORAL | Status: AC | PRN

## 2015-01-26 MED ORDER — HALOPERIDOL 5 MG PO TABS: 5.0000 mg | ORAL_TABLET | Freq: Three times a day (TID) | ORAL | Status: AC | PRN

## 2015-01-26 NOTE — Addendum Note (Signed)
Encounter addended by: Knox Royalty, NP on: 01/26/2015  5:58 AM<BR>     Documentation filed: Charges VN, Clinical Notes

## 2015-01-26 NOTE — Telephone Encounter (Signed)
Burnis Kingfisher RN, hospice nurse, calling stating patient is experiencing increased anxiety, especially at night. Dr Marin Olp notified and order for Haldol received. Lavina made aware.

## 2015-01-30 ENCOUNTER — Ambulatory Visit: Payer: BC Managed Care – PPO | Admitting: Hematology & Oncology

## 2015-01-30 ENCOUNTER — Other Ambulatory Visit: Payer: Medicare Other

## 2015-02-04 ENCOUNTER — Other Ambulatory Visit: Payer: Self-pay | Admitting: *Deleted

## 2015-02-04 DIAGNOSIS — C711 Malignant neoplasm of frontal lobe: Secondary | ICD-10-CM

## 2015-02-04 MED ORDER — LORAZEPAM 1 MG PO TABS: 1.0000 mg | ORAL_TABLET | Freq: Four times a day (QID) | ORAL | Status: AC

## 2015-02-04 NOTE — Telephone Encounter (Signed)
Patient is becoming more anxious and Lavina, Hospice RN, feels like the patient could benefit from scheduled Ativan. Spoke to Dr Marin Olp who wants patient to take Ativan 1mg  QID. Will fax prescription into pharmacy.

## 2015-02-18 ENCOUNTER — Ambulatory Visit: Payer: Medicare Other | Admitting: Radiation Oncology

## 2015-03-03 ENCOUNTER — Telehealth: Payer: Self-pay | Admitting: *Deleted

## 2015-03-03 NOTE — Telephone Encounter (Signed)
Patient passed away at home on 2015-03-07 at 3:08pm

## 2015-03-17 DEATH — deceased

## 2015-08-17 ENCOUNTER — Encounter: Payer: Self-pay | Admitting: Radiation Oncology

## 2016-04-24 IMAGING — US US EXTREM LOW VENOUS BILAT
1 series · 13 of 24 positions shown · non-contrast
Comparison: None.

CLINICAL DATA: Bilateral leg swelling right greater than left L4-5
months



[Series 1: us extrem low venous bilat · 0.08mm/px · 13 of 59 slices shown]
[im 1/59]
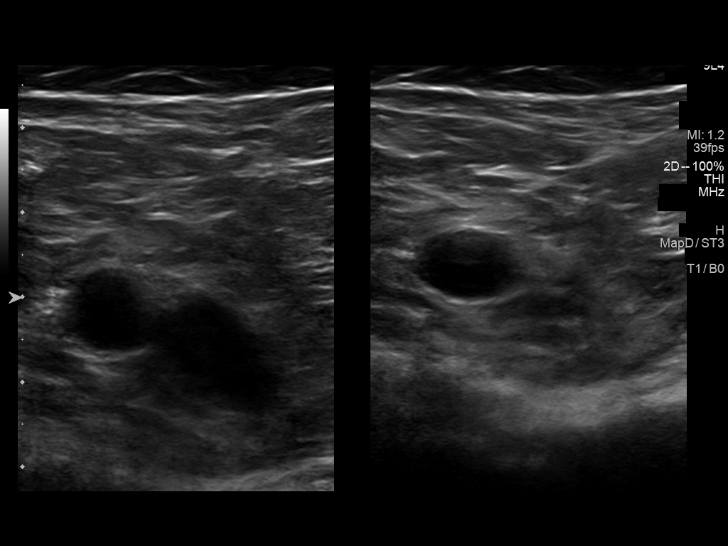
[im 6/59]
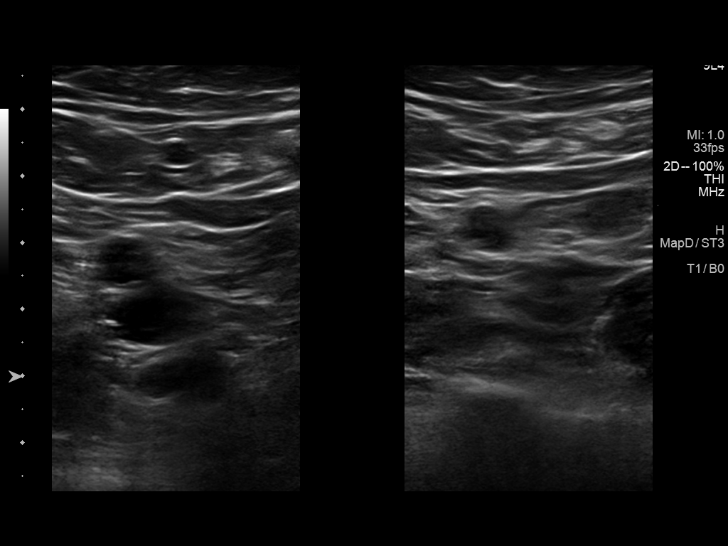
[im 11/59]
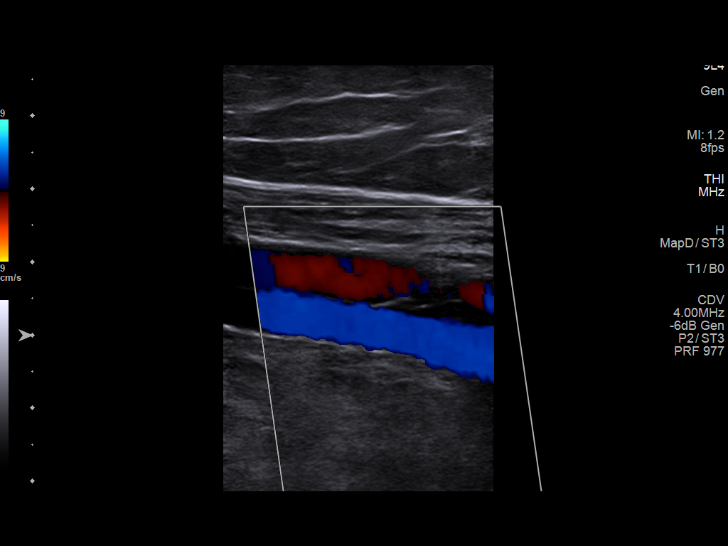
[im 16/59]
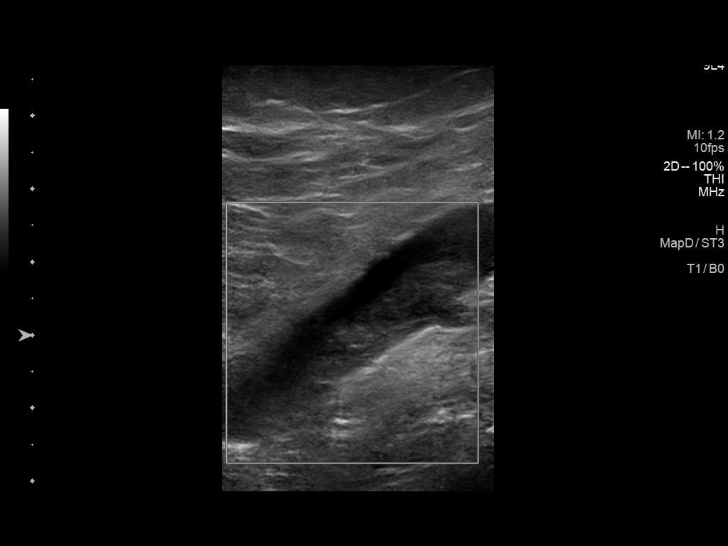
[im 21/59]
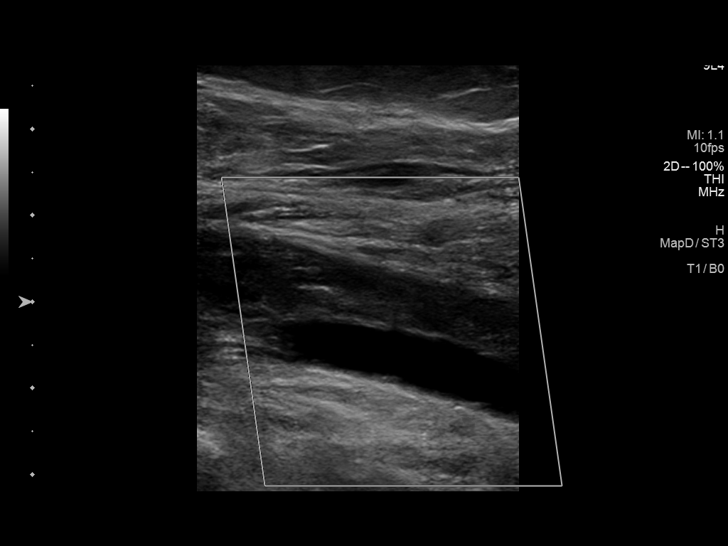
[im 26/59]
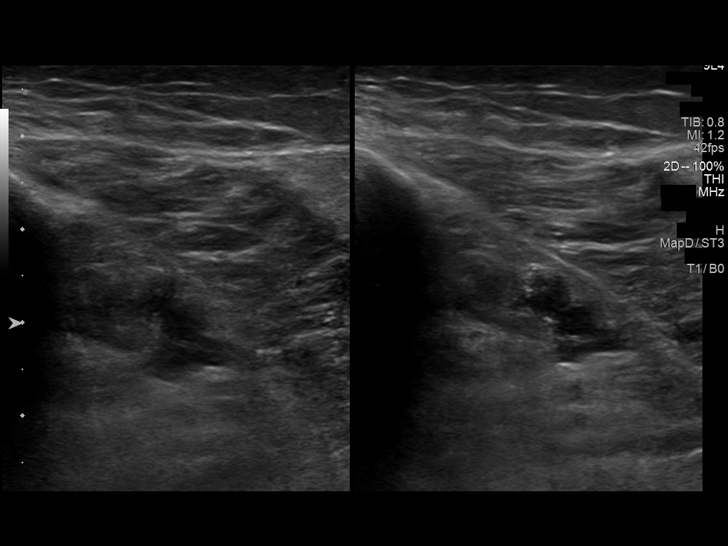
[im 31/59]
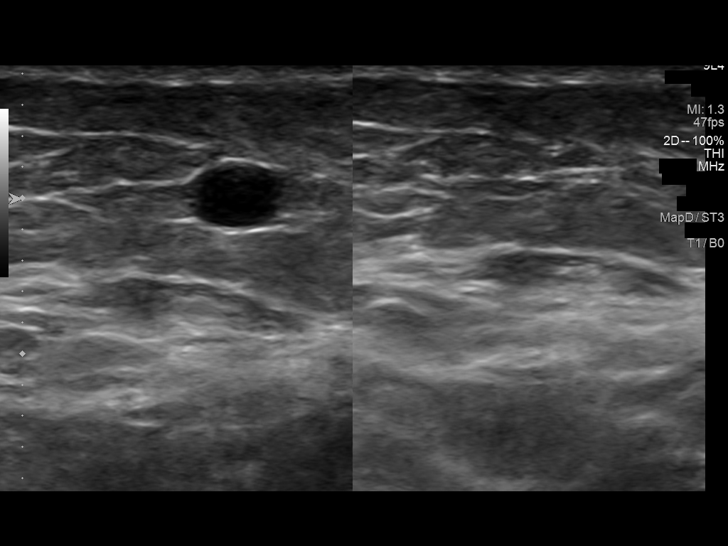
[im 33/59]
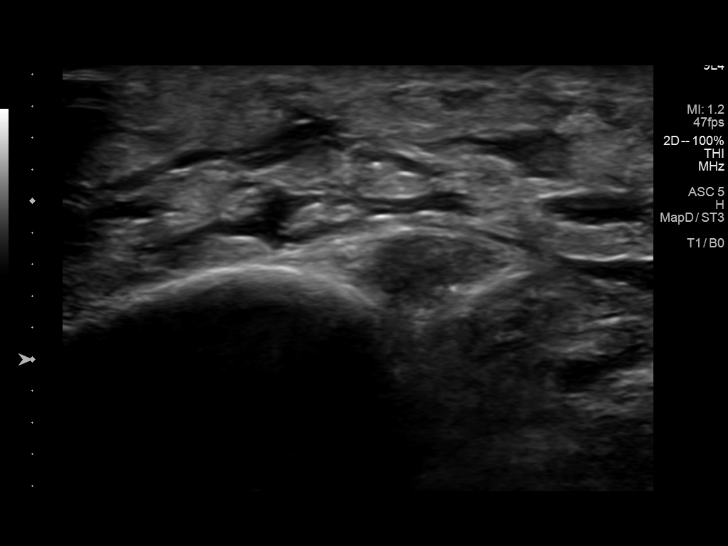
[im 38/59]
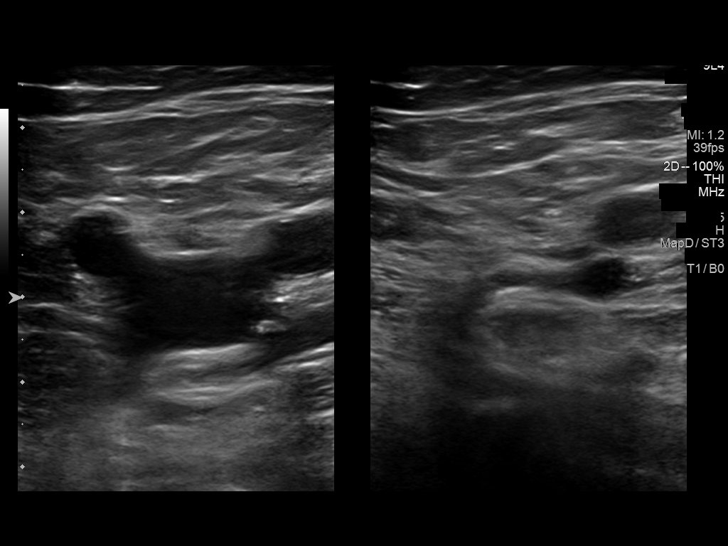
[im 43/59]
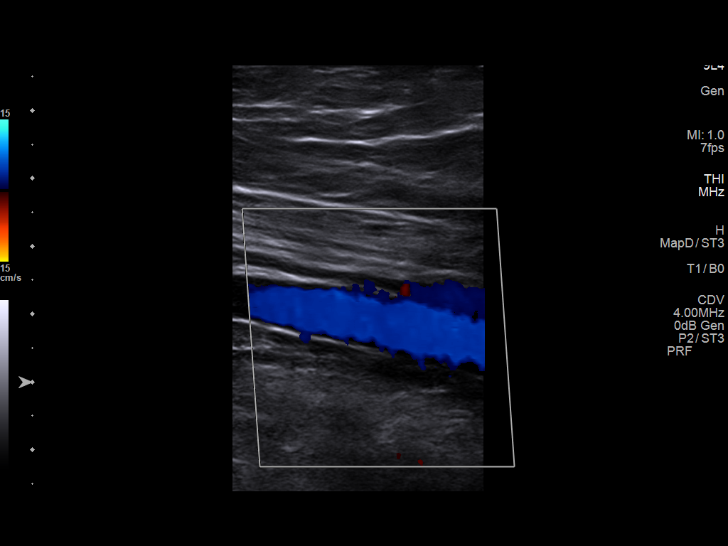
[im 48/59]
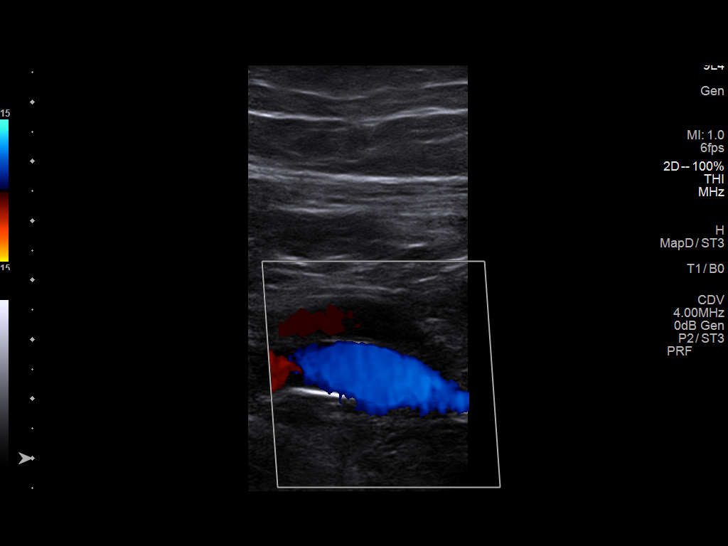
[im 53/59]
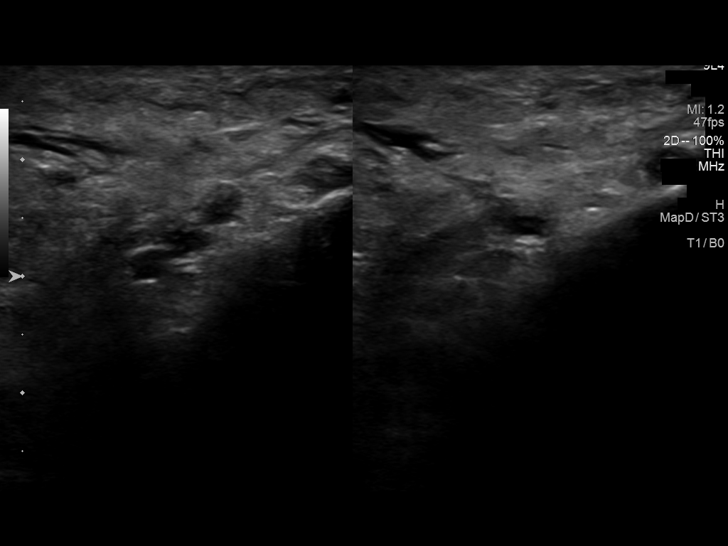
[im 59/59]
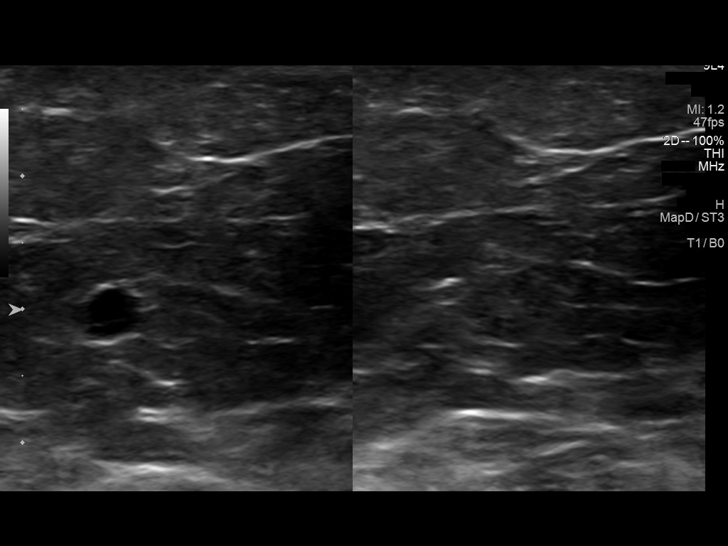

[13 of 24 positions shown; findings below may reference images not displayed]

FINDINGS: RIGHT LOWER EXTREMITY

Common Femoral Vein: No evidence of thrombus. Normal
compressibility, respiratory phasicity and response to augmentation.

Saphenofemoral Junction: No evidence of thrombus. Normal
compressibility and flow on color Doppler imaging.

Profunda Femoral Vein: No evidence of thrombus. Normal
compressibility and flow on color Doppler imaging.

Femoral Vein: No evidence of thrombus. Normal compressibility,
respiratory phasicity and response to augmentation.

Popliteal Vein: Partially occlusive thrombus is noted within the
majority of the popliteal vein. It is more occlusive in the more
peripheral component.

Calf Veins: The posterior tibial and peroneal veins also demonstrate
evidence of deep venous thrombosis.

Superficial Great Saphenous Vein: No evidence of thrombus. Normal
compressibility and flow on color Doppler imaging.

Venous Reflux:  None.

Other Findings:  None.

LEFT LOWER EXTREMITY

Common Femoral Vein: No evidence of thrombus. Normal
compressibility, respiratory phasicity and response to augmentation.

Saphenofemoral Junction: No evidence of thrombus. Normal
compressibility and flow on color Doppler imaging.

Profunda Femoral Vein: No evidence of thrombus. Normal
compressibility and flow on color Doppler imaging.

Femoral Vein: No evidence of thrombus. Normal compressibility,
respiratory phasicity and response to augmentation.

Popliteal Vein: No evidence of thrombus. Normal compressibility,
respiratory phasicity and response to augmentation.

Calf Veins: No evidence of thrombus. Normal compressibility and flow
on color Doppler imaging.

Superficial Great Saphenous Vein: No evidence of thrombus. Normal
compressibility and flow on color Doppler imaging.

Venous Reflux:  None.

Other Findings:  None.
IMPRESSION: Popliteal and infrapopliteal deep venous thrombosis on the right.

No deep venous thrombosis on the left is noted.

These results will be called to the ordering clinician or
representative by the [HOSPITAL] at the imaging location.

## 2016-05-06 IMAGING — CR DG CHEST 2V
2 series · 2 of 2 positions shown · non-contrast
Comparison: 01/06/2014

CLINICAL DATA: Initial evaluation for syncope fall today

EXAM:
CHEST  2 VIEW

[chest lat]
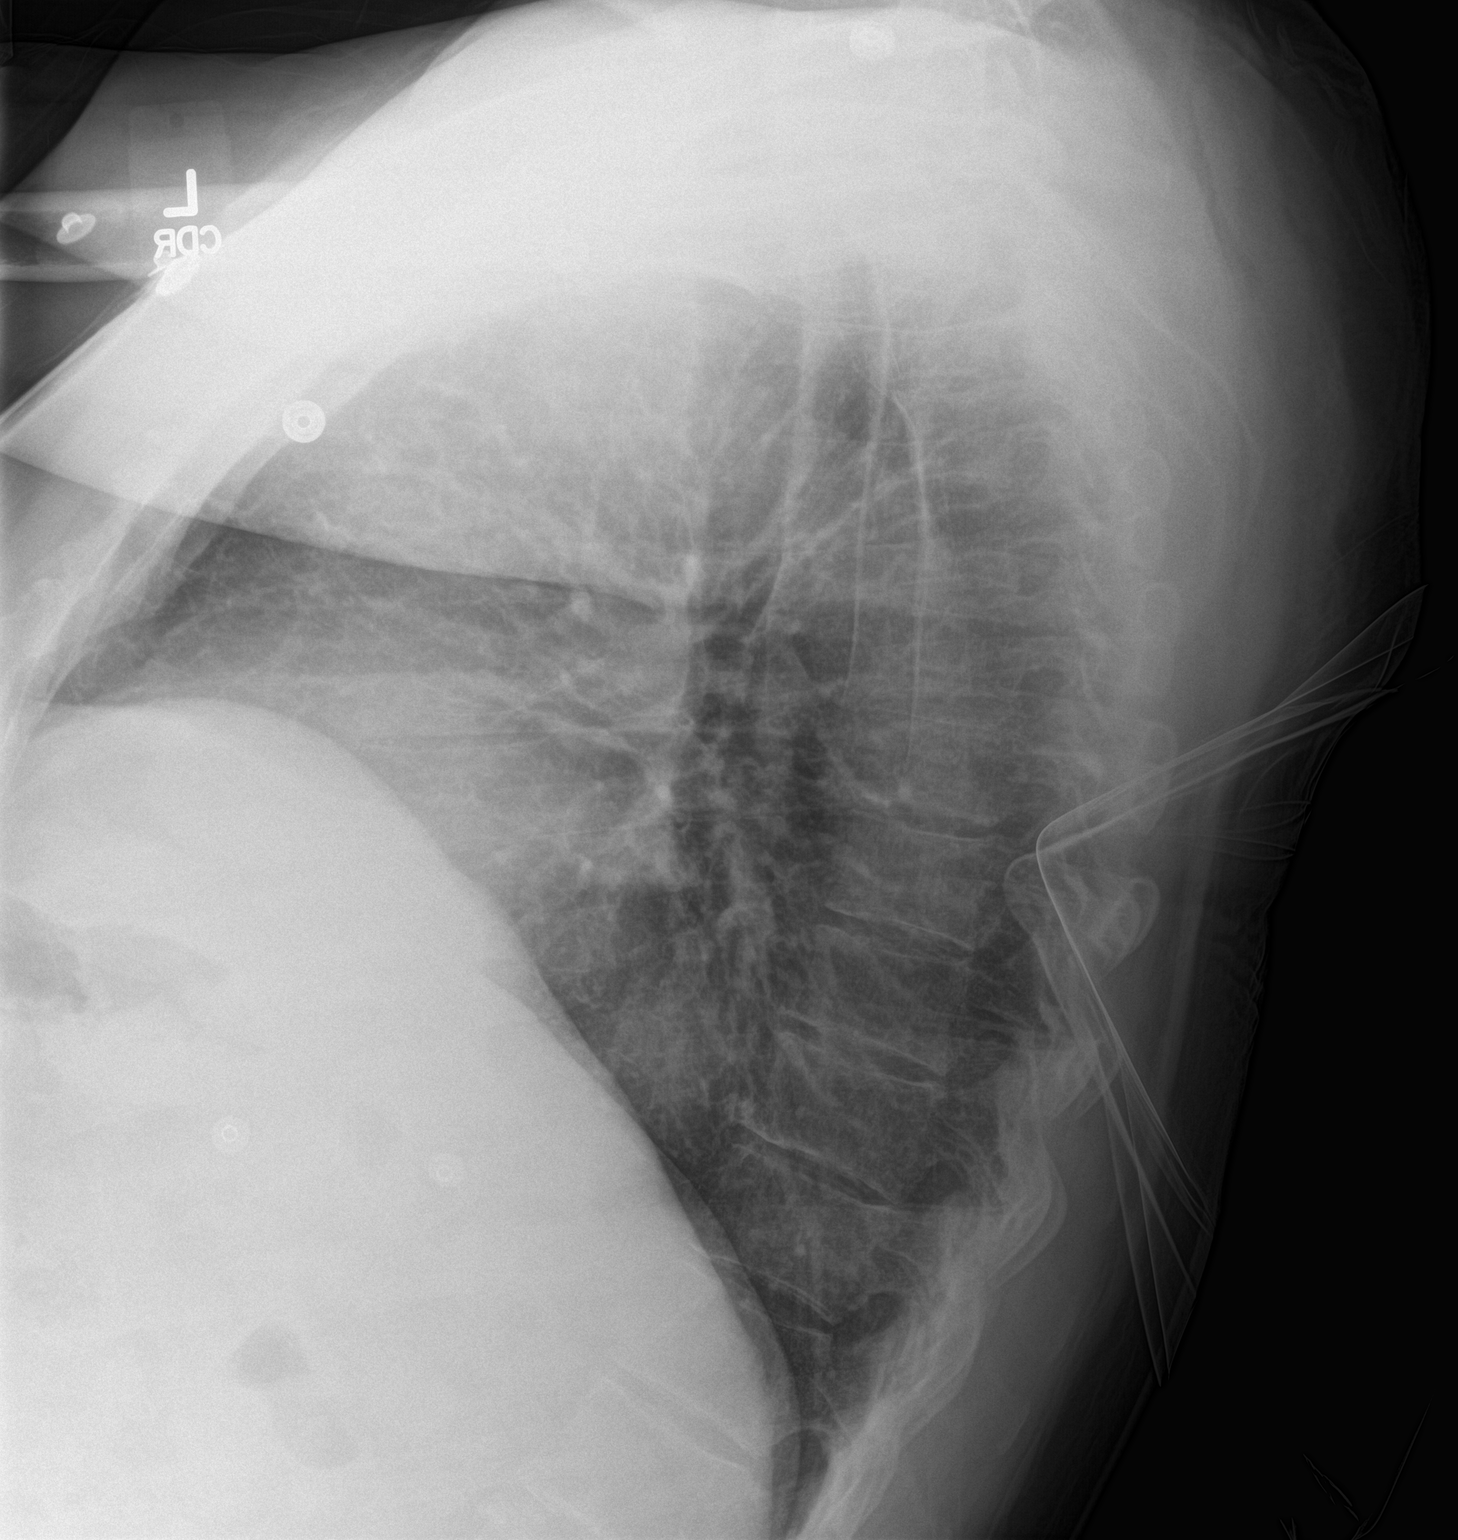

[chest ap]
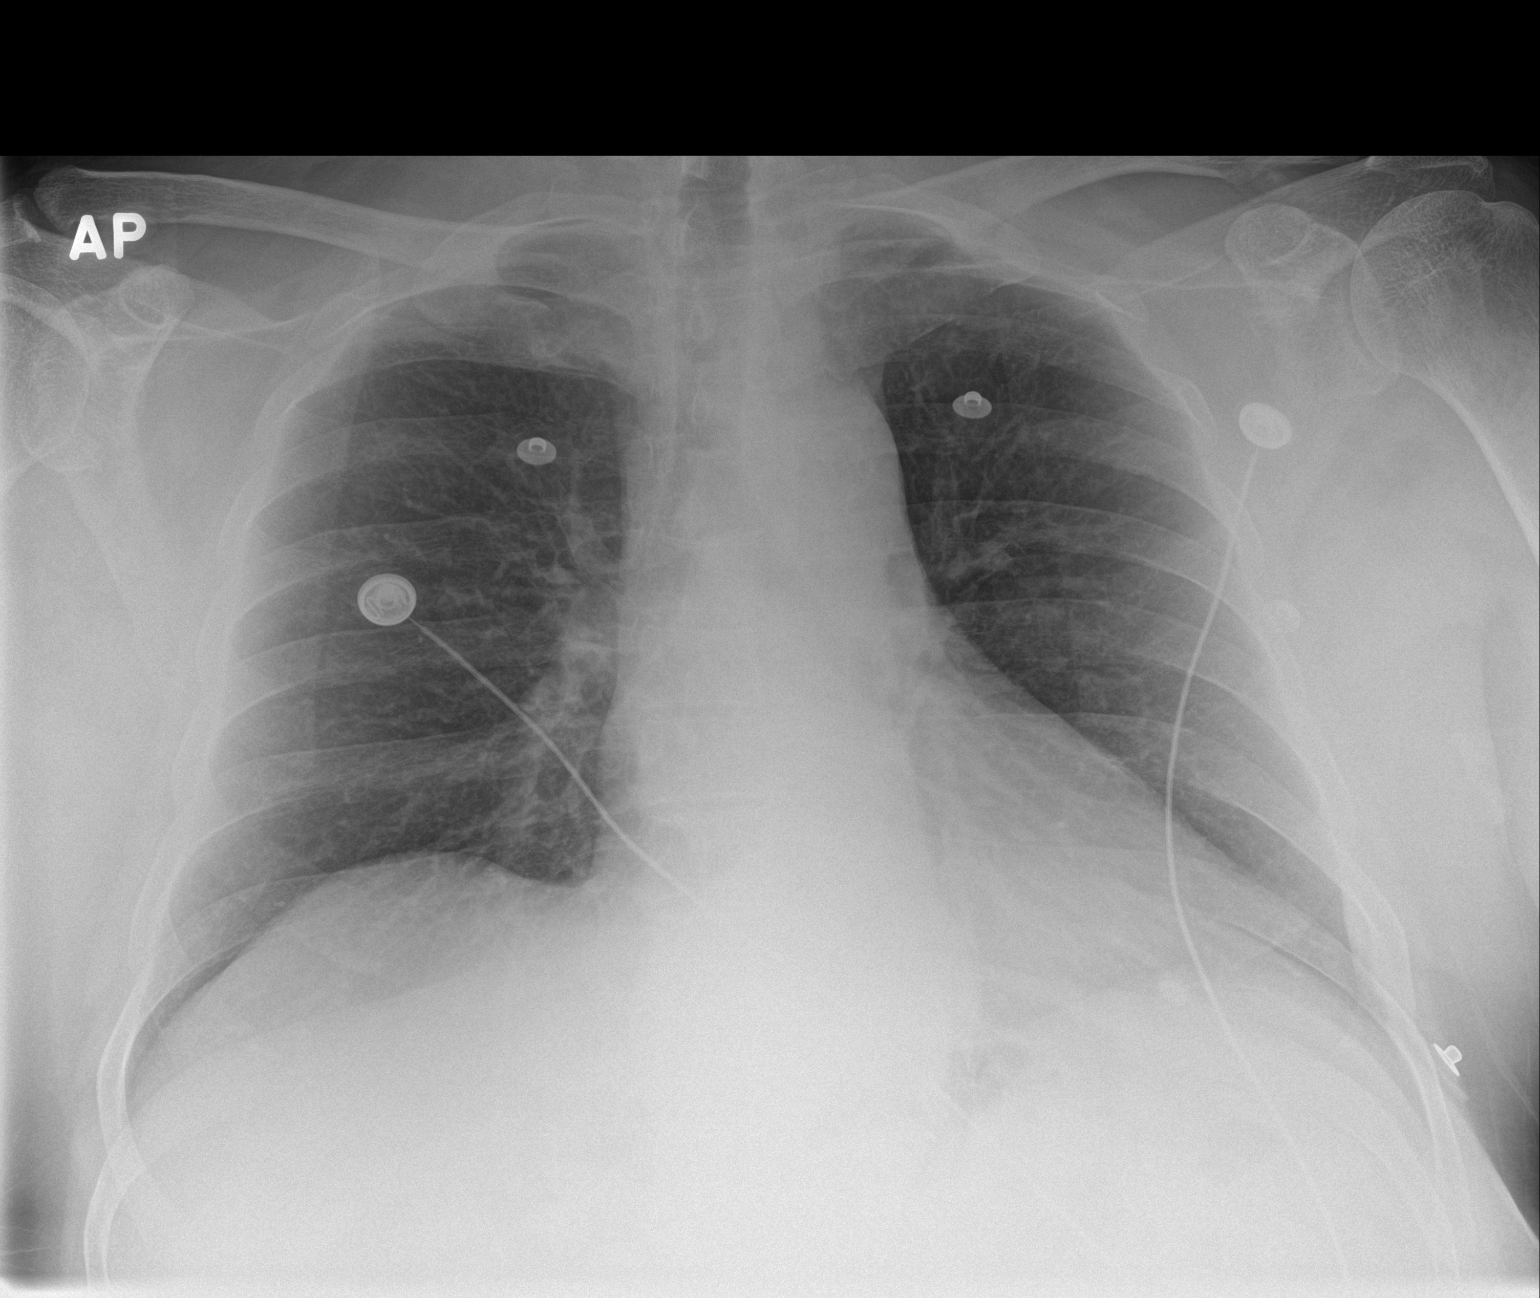

[2 of 2 positions shown; findings below may reference images not displayed]

FINDINGS: Stable mild cardiac enlargement. Vascular pattern normal. No
consolidation effusion or pneumothorax. 8 mm calcified granuloma
left lower lobe stable.
IMPRESSION: No active cardiopulmonary disease.

## 2016-05-06 IMAGING — CT CT HEAD W/O CM
2 series · 16 of 30 positions shown, 18 images · non-contrast
Comparison: CT head 12/29/2013. Most recent MR 03/31/2014 from [[facility]] [HOSPITAL], 1.5 T.

CLINICAL DATA: Recent diagnosis of RIGHT frontal glioblastoma
treated with surgery and radiotherapy. Generalized weakness
beginning earlier today. Recent fall. Also history of renal cell
carcinoma. Recent diagnosis of deep venous thrombosis of the leg.

EXAM:
CT HEAD WITHOUT CONTRAST
TECHNIQUE: Contiguous axial images were obtained from the base of the skull
through the vertex without intravenous contrast.

[Series 201: head w/o, idose (1) · axial · non-contrast · 0.49mm/px · z∈[+81,+221]mm · 8 of 38 slices shown, 10 images]
[im 5/38  brain]
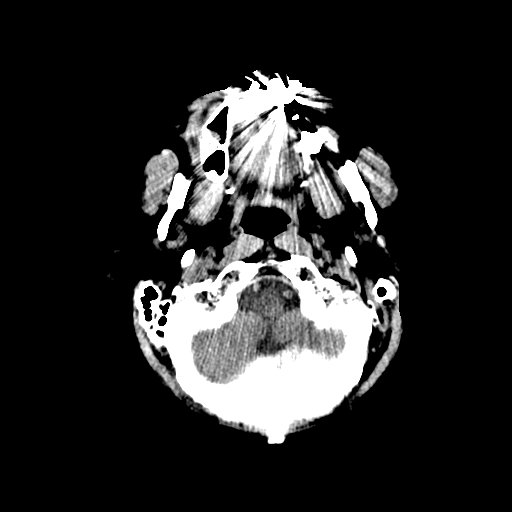
[im 5/38  bone]
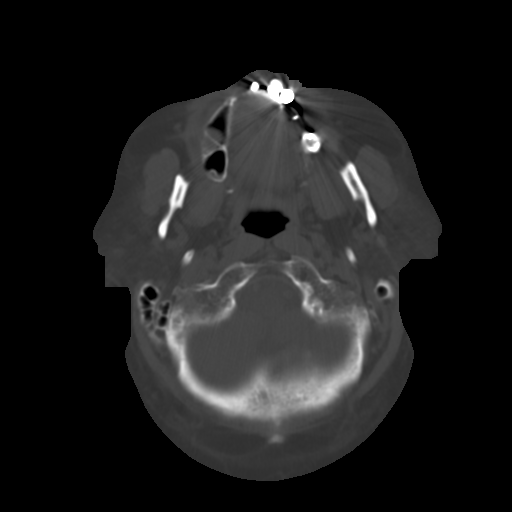
[im 9/38  brain]
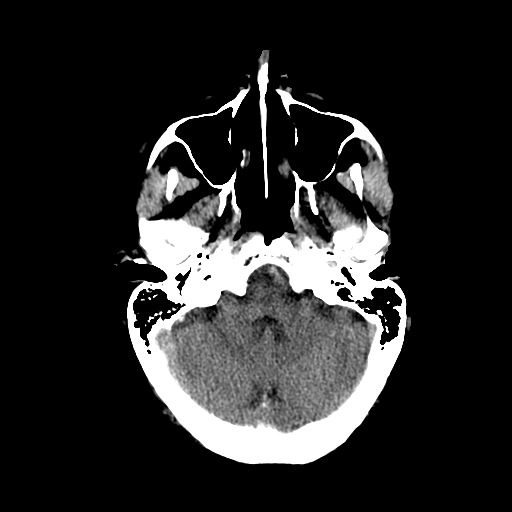
[im 13/38  brain]
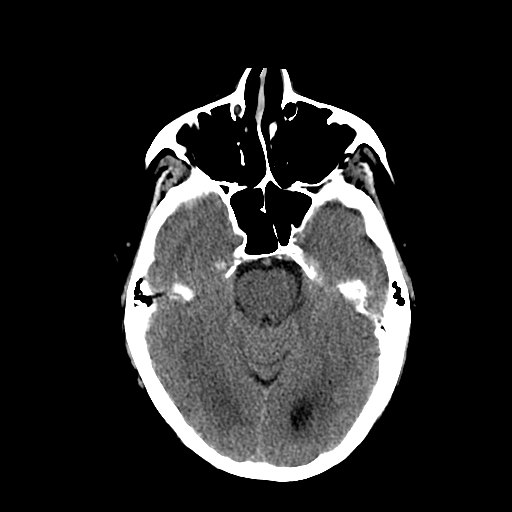
[im 17/38  brain]
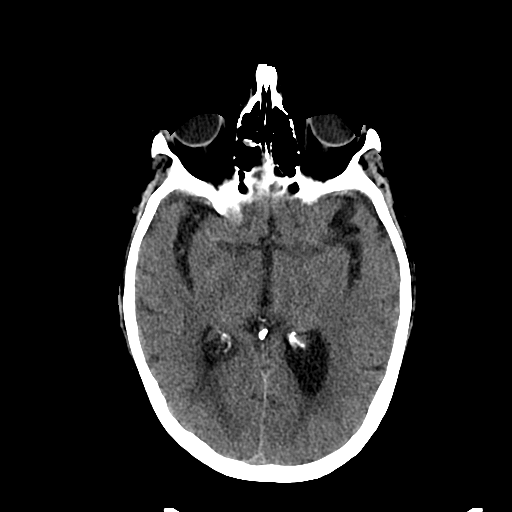
[im 21/38  brain]
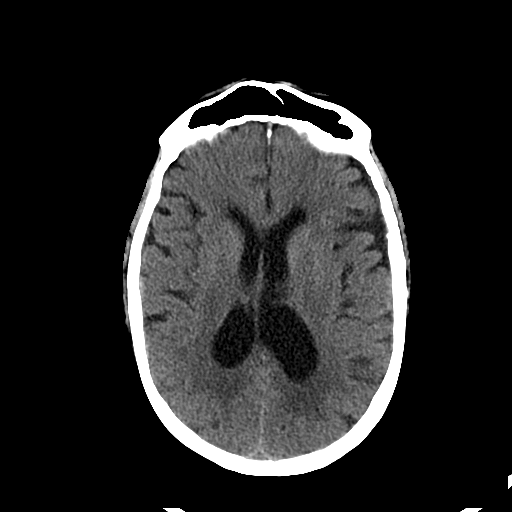
[im 21/38  bone]
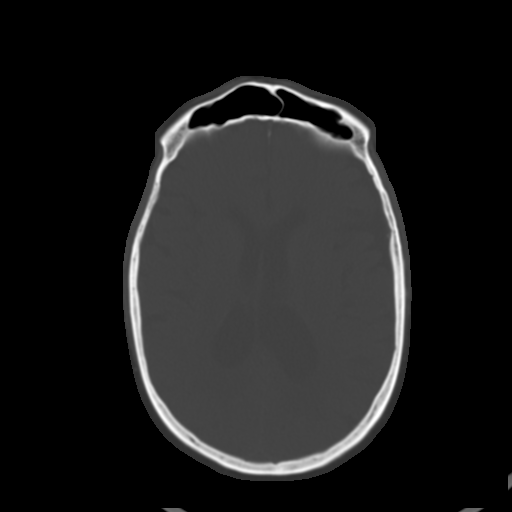
[im 25/38  brain]
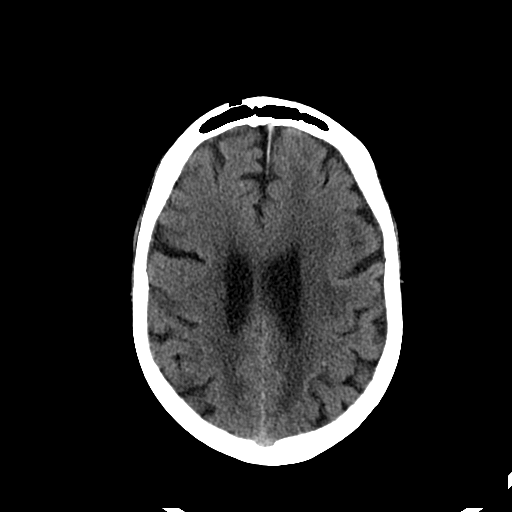
[im 29/38  brain]
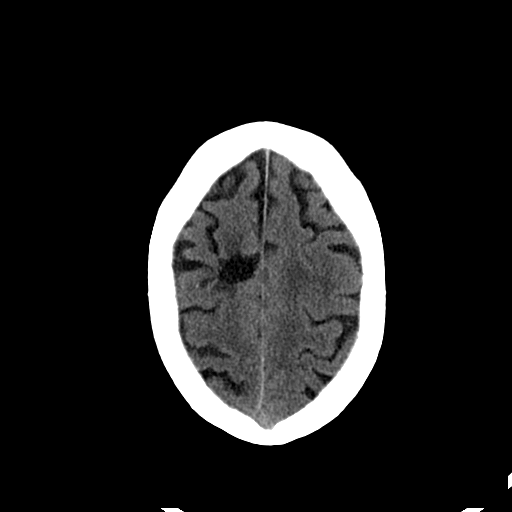
[im 33/38  brain]
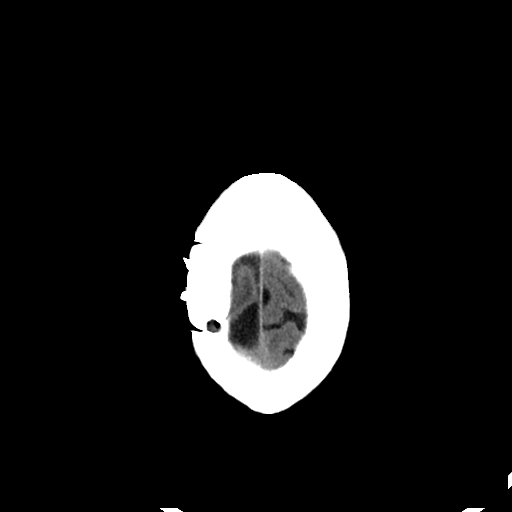

[Series 202: head w/o bone, idose (1) · axial · non-contrast · 0.49mm/px · z∈[+77,+227]mm · 8 of 76 slices shown]
[im 8/76  bone]
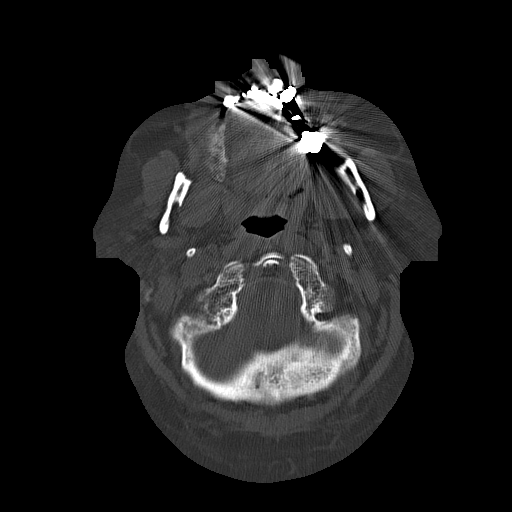
[im 16/76  bone]
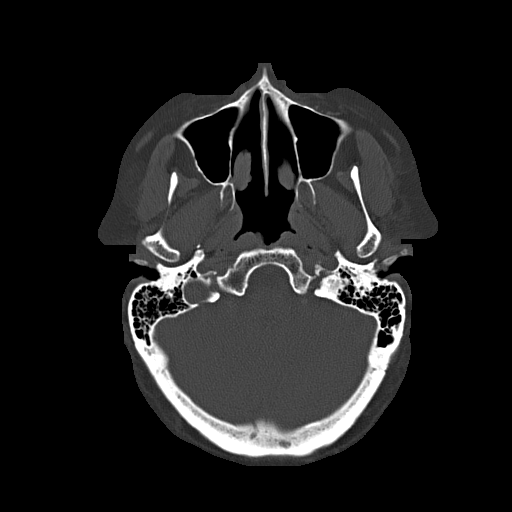
[im 24/76  bone]
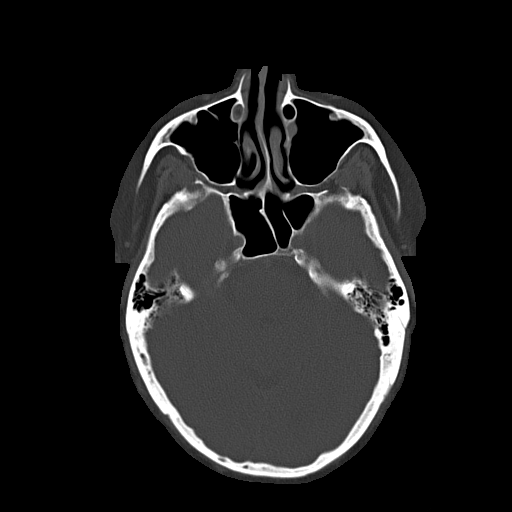
[im 32/76  bone]
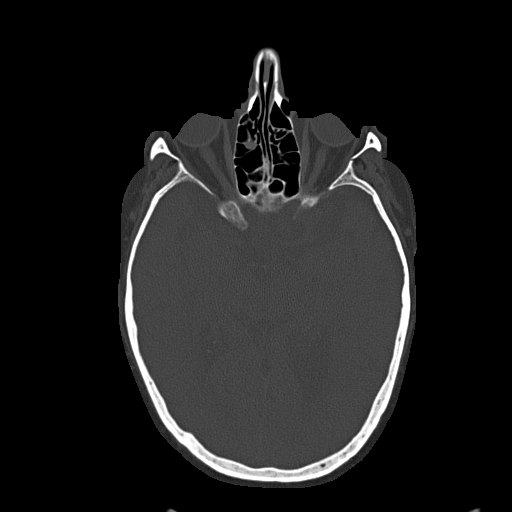
[im 44/76  bone]
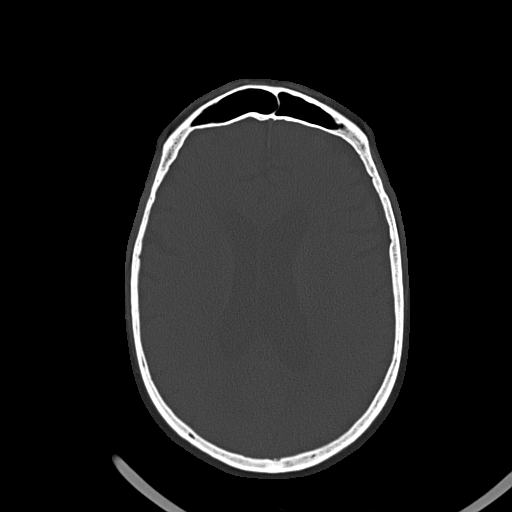
[im 52/76  bone]
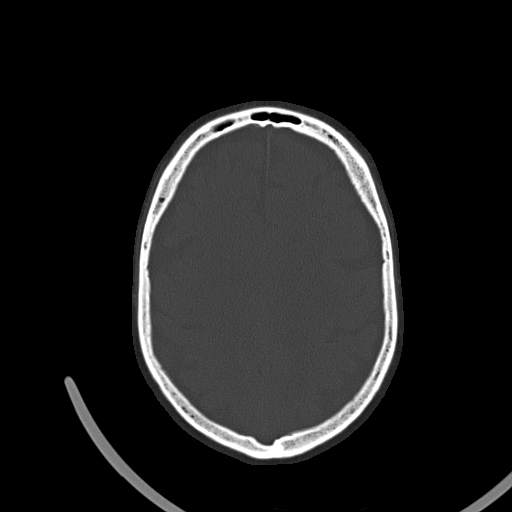
[im 60/76  bone]
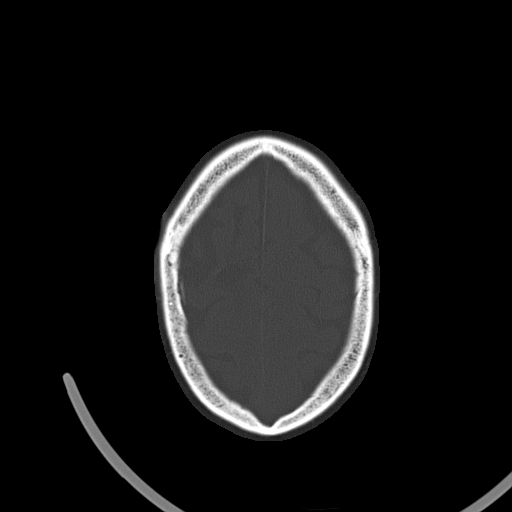
[im 68/76  bone]
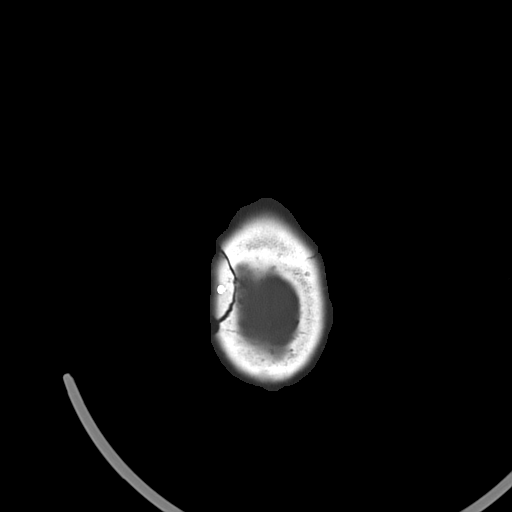

[16 of 30 positions shown; findings below may reference images not displayed]

FINDINGS: The patient has undergone RIGHT frontal craniotomy for RIGHT frontal
parasagittal GBM removal. There is a small calcification or
implanted device in the inferior and anterior portion of the
surgical cavity (image 27 series 201. No significant mass effect or
features to strongly suggest recurrent tumor on this noncontrast
exam.

There is hypoattenuation of the LEFT greater than RIGHT frontal and
parietal white matter suggesting post treatment effect. Baseline
mild ventriculomegaly. Mild atrophy without extra-axial fluid
collection. Craniotomy site unremarkable. Calvarium otherwise
intact. No sinus or mastoid disease.
IMPRESSION: Unremarkable appearing noncontrast CT head. No posttraumatic
sequelae.

No features to strongly suggest recurrent tumor. Post treatment
effect with leukoencephalopathy of the supratentorial white matter.

If further investigation is desired, consider MRI brain without and
with contrast for further evaluation.
